# Patient Record
Sex: Male | Born: 1935
Health system: Southern US, Community
[De-identification: ages and names within clinical notes are randomized; demographics above are authoritative.]

## PROBLEM LIST (undated history)

## (undated) DIAGNOSIS — M179 Osteoarthritis of knee, unspecified: Secondary | ICD-10-CM

## (undated) DIAGNOSIS — I4510 Unspecified right bundle-branch block: Secondary | ICD-10-CM

## (undated) DIAGNOSIS — I10 Essential (primary) hypertension: Secondary | ICD-10-CM

## (undated) DIAGNOSIS — K219 Gastro-esophageal reflux disease without esophagitis: Secondary | ICD-10-CM

## (undated) DIAGNOSIS — K589 Irritable bowel syndrome without diarrhea: Secondary | ICD-10-CM

## (undated) DIAGNOSIS — J302 Other seasonal allergic rhinitis: Secondary | ICD-10-CM

## (undated) DIAGNOSIS — G834 Cauda equina syndrome: Secondary | ICD-10-CM

## (undated) DIAGNOSIS — M069 Rheumatoid arthritis, unspecified: Secondary | ICD-10-CM

## (undated) DIAGNOSIS — M1990 Unspecified osteoarthritis, unspecified site: Secondary | ICD-10-CM

## (undated) DIAGNOSIS — M1710 Unilateral primary osteoarthritis, unspecified knee: Secondary | ICD-10-CM

## (undated) DIAGNOSIS — E785 Hyperlipidemia, unspecified: Secondary | ICD-10-CM

## (undated) DIAGNOSIS — N189 Chronic kidney disease, unspecified: Secondary | ICD-10-CM

## (undated) DIAGNOSIS — D126 Benign neoplasm of colon, unspecified: Secondary | ICD-10-CM

## (undated) DIAGNOSIS — N400 Enlarged prostate without lower urinary tract symptoms: Secondary | ICD-10-CM

## (undated) HISTORY — DX: Irritable bowel syndrome, unspecified: K58.9

## (undated) HISTORY — DX: Rheumatoid arthritis, unspecified: M06.9

## (undated) HISTORY — DX: Unspecified right bundle-branch block: I45.10

## (undated) HISTORY — DX: Cauda equina syndrome: G83.4

## (undated) HISTORY — DX: Unspecified osteoarthritis, unspecified site: M19.90

## (undated) HISTORY — DX: Gastro-esophageal reflux disease without esophagitis: K21.9

## (undated) HISTORY — DX: Essential (primary) hypertension: I10

## (undated) HISTORY — DX: Other seasonal allergic rhinitis: J30.2

## (undated) HISTORY — DX: Benign prostatic hyperplasia without lower urinary tract symptoms: N40.0

## (undated) HISTORY — DX: Unilateral primary osteoarthritis, unspecified knee: M17.10

## (undated) HISTORY — DX: Chronic kidney disease, unspecified: N18.9

## (undated) HISTORY — PX: KNEE ARTHROPLASTY: SHX992

## (undated) HISTORY — DX: Hyperlipidemia, unspecified: E78.5

## (undated) HISTORY — DX: Osteoarthritis of knee, unspecified: M17.9

## (undated) HISTORY — DX: Benign neoplasm of colon, unspecified: D12.6

---

## 1972-01-16 HISTORY — PX: SPINE SURGERY: SHX786

## 2001-08-01 ENCOUNTER — Encounter: Payer: Self-pay | Admitting: Internal Medicine

## 2001-08-01 ENCOUNTER — Encounter: Admission: RE | Admit: 2001-08-01 | Discharge: 2001-08-01 | Payer: Self-pay | Admitting: Internal Medicine

## 2004-12-15 HISTORY — PX: JOINT REPLACEMENT: SHX530

## 2004-12-25 ENCOUNTER — Inpatient Hospital Stay (HOSPITAL_COMMUNITY): Admission: RE | Admit: 2004-12-25 | Discharge: 2004-12-28 | Payer: Self-pay | Admitting: Orthopedic Surgery

## 2005-01-15 HISTORY — PX: OTHER SURGICAL HISTORY: SHX169

## 2007-01-16 HISTORY — PX: OTHER SURGICAL HISTORY: SHX169

## 2008-09-27 ENCOUNTER — Encounter: Admission: RE | Admit: 2008-09-27 | Discharge: 2008-09-27 | Payer: Self-pay | Admitting: Neurological Surgery

## 2009-04-27 LAB — LIPID PANEL
Cholesterol: 145 mg/dL (ref 0–200)
LDL Cholesterol: 76 mg/dL
Triglycerides: 119 mg/dL (ref 40–160)

## 2009-07-15 DIAGNOSIS — D126 Benign neoplasm of colon, unspecified: Secondary | ICD-10-CM

## 2009-07-15 HISTORY — DX: Benign neoplasm of colon, unspecified: D12.6

## 2010-05-01 LAB — HEPATIC FUNCTION PANEL: AST: 23 U/L (ref 14–40)

## 2010-05-01 LAB — BASIC METABOLIC PANEL
BUN: 27 mg/dL — AB (ref 4–21)
Creatinine: 1.5 mg/dL — AB (ref 0.6–1.3)
Potassium: 4.9 mmol/L (ref 3.4–5.3)

## 2010-05-01 LAB — LIPID PANEL: HDL: 35 mg/dL (ref 35–70)

## 2010-05-30 LAB — BASIC METABOLIC PANEL: Sodium: 140 mmol/L (ref 137–147)

## 2010-06-02 NOTE — Op Note (Signed)
Jose Meza                ACCOUNT NO.:  [id number]   MEDICAL RECORD NO.:  [id number]          PATIENT TYPE:  INP   LOCATION:  0099                         FACILITY:  [facility]   PHYSICIAN:  Ollen Gross, M.D.    DATE OF BIRTH:  Nov 01, 1931   DATE OF PROCEDURE:  12/25/2004  DATE OF DISCHARGE:                                 OPERATIVE REPORT   PREOPERATIVE DIAGNOSIS:  Osteoarthritis right knee.   POSTOPERATIVE DIAGNOSIS:  Osteoarthritis right knee.   PROCEDURE:  Right total knee arthroplasty.   SURGEON:  Ollen Gross, MD.   ASSISTANT:  Avel Peace, PA-C.   ANESTHESIA:  Spinal.   ESTIMATED BLOOD LOSS:  Minimal.   DRAINS:  Hemovac x1.   TOURNIQUET TIME:  63 minutes at 300 mmHg.   COMPLICATIONS:  None.   CONDITION:  Stable to recovery.   BRIEF CLINICAL NOTE:  Jose Meza is a 77 year old male with severe end-stage  osteoarthritis of the right knee with progressively worsening pain and  dysfunction. He has developed a worsening flexion contracture now  approximately 30 to 35 degrees. He presents now for right total knee  arthroplasty.   PROCEDURE IN DETAIL:  After successful administration of spinal anesthetic,  a tourniquet was placed high on his right thigh and right lower extremity  prepped and draped in the usual sterile fashion. Extremity was wrapped in  Esmarch, knee flexed and tourniquet inflated to 300 mmHg. A standard midline  incision was made with a 10 blade through the subcutaneous tissue to the  level of the extensor mechanism. A fresh blade was used to make a medial  parapatellar arthrotomy and then the soft tissue over the proximal medial  tibia is subperiosteally elevated to the joint line with a knife and into  the semimembranosus bursa with a Cobb elevator. The soft tissue over the  proximal lateral tibia is also elevated with attention being paid to  avoiding the patellar tendon on the tibial tubercle. The patella was  everted, knee flexed 90 degrees,  and ACL and PCL removed. A drill was used  to create a starting hole in the distal femur and the canal was irrigated. A  5 degree right valgus alignment guide was placed and referencing off the  posterior condyles rotation is marked and the block pinned to remove 10 mm  off the distal femur. Distal femoral resection is made with an oscillating  saw. Due to his significant flexion contracture, we took an additional 2 mm  off to open up the extension space further. A size 5 is most appropriate  femoral size and the rotations marked off the epicondylar axis. The size 5  cutting block is placed and then the anterior, posterior and chamfer cuts  were made.   The tibia subluxed forward and the menisci are removed. Extramedullary  tibial alignment guide is placed referencing proximally at the medial aspect  of the tibial tubercle and distally along the second metatarsal axis and  tibial crest. The block is pinned to remove 10 mm off of the less deficient  lateral side. Tibial resection is made with an  oscillating saw. The proximal  tibia is then prepared with the modular drill and keel punch for a size 5.  Femoral preparation is completed with the intercondylar cut for the size 5.   A size 5 mobile bearing tibial trial with a size 5 posterior face stabilized  femoral trial and a 10 mm posterior stabilized rotating platform insert  trial are placed. Full extension was not achieved with a 10. We subsequently  removed with the trial and the tibial tray and removed all the osteophytes  off the posterior femur. There was a significant amount of osteophytes  present. I also stripped the posterior capsule off the femur. I then placed  and a tibial tray again with the 10 mm insert. He still lacked about 10-50  degrees from full extension and was tight in flexion also. We them removed  the tibial tray and took an additional 2 mm off the proximal tibia using the  cutting block and extramedullary guide. I  then reprepared the proximal tibia  for the 5. With the 10 insert, he now achieved full extension and had  excellent balance in flexion and extension. The patella was then everted and  thickness measured to be 27 mm. Freehand resection was taken to 14 mm, a 41  template was placed, lug holes were drilled, trial patella was placed and it  tracks normally. The trials were then removed and the cut bone surfaces were  prepared with pulsatile lavage. Cement is mixed and once ready for  implantation, a size 5 mobile bearing tibial tray, size 5 posterior  stabilized femur and 41 patella are cemented into place and the patella is  held with a clamp. The trial 10 mm insert is placed, knee held in full  extension and all extruded cement removed. Once the cement was fully  hardened then the permanent 10 mm posterior stabilized rotating platform  insert is placed into the tibial tray. The wound was copiously irrigated  with saline solution and the extensor mechanism closed over a Hemovac drain  with interrupted #1 PDS. Flexion against gravity was 130 degrees. The subcu  tissues were then closed with interrupted 2-0 Vicryl. The tourniquet was  released with a total time of 63 minutes. The subcuticular was closed with  running 4-0 Monocryl. A drain is hooked to suction, incision cleaned and  dried and Steri-Strips and a bulky sterile dressing applied. He was then  placed into a knee immobilizer, awakened and transported to recovery in  stable condition.      Ollen Gross, M.D.  Electronically Signed     FA/MEDQ  D:  12/25/2004  T:  12/26/2004  Job:  [id number]

## 2010-06-02 NOTE — H&P (Signed)
NAMEDANILO, CAPPIELLO NO.:  [id number]   MEDICAL RECORD NO.:  [id number]          PATIENT TYPE:   LOCATION:                                 FACILITY:   PHYSICIAN:  Ollen Gross, M.D.         DATE OF BIRTH:   DATE OF ADMISSION:  12/25/2004  DATE OF DISCHARGE:                                HISTORY & PHYSICAL   Date of office visit and history and physical:  December 14, 2004.   CHIEF COMPLAINT:  Right knee pain.   HISTORY OF PRESENT ILLNESS:  The patient is a 77 year old male seen by Dr.  Lequita Halt for ongoing right knee pain.  He has been told in the past he has  knee replacement. His wife has been a patient of Dr. Deri Fuelling for sometime  now.  He has been seen and evaluated and found to have bone-on-bone changes.  It is felt he has reached the point where due to his pain and dysfunction,  he would benefit from knee replacement.  Risks and benefits have been  discussed.  The patient subsequently admitted to the hospital.   ALLERGIES:  CODEINE causes mild hallucinations and dizziness.   CURRENT MEDICATIONS:  1.  Atenolol 50 mg.  2.  Flomax.  3.  Proscar.  4.  Lipitor.  5.  Nexium.   PAST MEDICAL HISTORY:  1.  Hypertension.  2.  Reflux disease.  3.  History of prostatitis.  4.  Degenerative disk disease.   PAST SURGICAL HISTORY:  Decompressive laminectomy in 1974.   FAMILY HISTORY:  History of cancer.   SOCIAL HISTORY:  Married.  Retired.  Non-smoker.  Three to five ounces of  alcohol per week.  Three children.   REVIEW OF SYSTEMS:  GENERAL:  No fever, chills or night sweats.  NEUROLOGIC:  No seizures, syncope or paralysis.  RESPIRATORY:  No shortness of breath,  productive cough, or hemoptysis.  CARDIOVASCULAR:  No chest pain, angina or  orthopnea.  GI:  No nausea, vomiting, diarrhea, or constipation.  GU:  No  dysuria, hematuria or discharge.  MUSCULOSKELETAL:  Right knee.   PHYSICAL EXAMINATION:  VITAL SIGNS: Pulse 88, respirations 12, blood  pressure 120/76.  GENERAL:  A 77 year old white male, tall frame, in no acute distress. Well-  developed. Accompanied by his wife.  HEENT:  Normocephalic, atraumatic.  Pupils are round and reactive.  Oropharynx clear. EOMs are intact. Noted to wear glasses.  NECK:  Supple.  CHEST:  Clear.  HEART:  Regular rate and rhythm.  No murmurs.  ABDOMEN:  Soft, nontender.  Bowel sounds present.  RECTAL/BREASTS/GENITALIA:  Not done and not pertinent to present illness.  EXTREMITIES:  Right knee:  Right knee shows a slight valgus malalignment  deformity.  Range of motion 5 to 110 degrees.  Marked crepitus noted.   IMPRESSION:  Osteoarthritis, right knee.   PLAN:  The patient admitted to [facility] to undergo right total  knee arthroplasty.  Surgery will be performed by Dr. Ollen Gross.  Medical  doctor is Dr. Kirby Funk who will be notified  of the admission and be  consulted if needed for medical assistance for the patient throughout the  hospital course.      Alexzandrew L. Julien Girt, P.A.      Ollen Gross, M.D.  Electronically Signed    ALP/MEDQ  D:  01/01/2005  T:  01/02/2005  Job:  [id number]   cc:   Thora Lance, M.D.  Fax: [phone]

## 2010-06-02 NOTE — Discharge Summary (Signed)
[name], [name]                ACCOUNT NO.:  [id number]   MEDICAL RECORD NO.:  [id number]          PATIENT TYPE:  INP   LOCATION:  1613                         FACILITY:  [facility]   PHYSICIAN:  Jose Meza, M.D.    DATE OF BIRTH:  [birth year]   DATE OF ADMISSION:  12/25/2004  DATE OF DISCHARGE:  12/28/2004                                 DISCHARGE SUMMARY   ADMISSION DIAGNOSES:  1.  Osteoarthritis, right knee.  2.  Hypertension.  3.  Reflux disease.  4.  History of prostatitis.  5.  Degenerative disk disease.   DISCHARGE DIAGNOSES:  1.  Osteoarthritis, right knee, status post right total knee arthroplasty.  2.  Hypertension.  3.  Reflux disease.  4.  History of prostatitis.  5.  Degenerative disk disease.  6.  Mild postoperative blood loss anemia.   PROCEDURE:  On December 25, 2004, right total knee.   SURGEON:  Jose Meza, M.D.   ASSISTANT:  Jose Meza, P.A.-C.   ANESTHESIA:  Spinal.   TOURNIQUET TIME:  Sixty-three minutes.   BRIEF HISTORY:  Jose Meza is a 77 year old with severe end-stage osteoarthritis  of the right knee, progressively worsening pain and dysfunction.  Now  presents for total knee.   CONSULTS:  None.   LABORATORY DATA:  Preop CBC:  Hemoglobin 16, hematocrit 47.  Postop  hemoglobin 13.3.  Last noted H&H 12.3 and 36.  PT/PTT on admission 13.5 and  31, respectively with an INR of 1.  Serial pro times followed:  PT/INR 21.4  and 1.8.  Chem panel on admission all within normal limits with the  exception of minimally elevated ALT of 55.  Serial BMETs are followed.  Electrolytes remained within normal limits.  Urinalysis:  Preop, small  leukocyte esterase, 0-2 white cells, rare bacteria, otherwise negative.  Blood group type A+.   EKG dated December 21, 2004:  Normal sinus rhythm, incomplete right bundle  branch block.   Two view chest on December 21, 2004:  Increased markings in the lung bases,  likely chronic.  No acute process  suspected.   HOSPITAL COURSE:  Admitted to [facility], tolerated the procedure  well, and was later transferred to the recovery room and then to the  orthopedic floor.  Was doing pretty well on day #1 except for pain.  Been  using PCA and p.o. analgesics.  Did have some positive fluid balance and  underwent some mild diuresis.  Later that day, the patient got a little bit  lightheaded and disoriented.  Was felt to be more associated with the  narcotics.  PCA was held.  He did improve.  By the following day, he was  still having some pain but felt better after he had been on the PCA.  It was  felt the episode was more likely due to the PCA narcotic.  He started  getting with physical therapy, ambulating approximately 5-10 feet on the  first day, but then did very well by day #3.  He was actually getting up to  100 feet.  Dressing was changed on day #  2.  Incision was healing well.  He  did so well on day #3 with this therapy, he had decided he wanted to go  home.  Arrangements were made.  Discharged home on December 28, 2004.   DISCHARGE PLAN:  1.  Patient was discharged home on December 14 , 2006.  2.  Discharge diagnoses:  Please see above.  3.  Discharge meds:  Coumadin, Percocet, Robaxin.  4.  Diet:  As tolerated.  5.  Follow up two weeks.  6.  Activity:  Weightbearing as tolerated.  Total knee protocol.  May start      showering.   DISPOSITION:  Home.   CONDITION ON DISCHARGE:  Improved.      Jose Meza, P.A.      Jose Meza, M.D.  Electronically Signed    ALP/MEDQ  D:  02/21/2005  T:  02/21/2005  Job:  [id number]   cc:   Dr. Valentina Meza

## 2010-07-26 ENCOUNTER — Telehealth: Payer: Self-pay | Admitting: Internal Medicine

## 2010-07-26 NOTE — Telephone Encounter (Signed)
Forwarded to Dr. Leschber for review.  °

## 2010-09-04 ENCOUNTER — Other Ambulatory Visit (HOSPITAL_BASED_OUTPATIENT_CLINIC_OR_DEPARTMENT_OTHER): Payer: Self-pay | Admitting: Rheumatology

## 2010-09-04 DIAGNOSIS — M659 Synovitis and tenosynovitis, unspecified: Secondary | ICD-10-CM

## 2010-09-09 ENCOUNTER — Ambulatory Visit (HOSPITAL_BASED_OUTPATIENT_CLINIC_OR_DEPARTMENT_OTHER)
Admission: RE | Admit: 2010-09-09 | Discharge: 2010-09-09 | Disposition: A | Discharge status: Home / Self Care | Payer: Medicare Other | Source: Ambulatory Visit | Attending: Rheumatology | Admitting: Rheumatology

## 2010-09-09 DIAGNOSIS — M25549 Pain in joints of unspecified hand: Secondary | ICD-10-CM | POA: Insufficient documentation

## 2010-09-09 DIAGNOSIS — M19039 Primary osteoarthritis, unspecified wrist: Secondary | ICD-10-CM | POA: Insufficient documentation

## 2010-09-09 DIAGNOSIS — M79609 Pain in unspecified limb: Secondary | ICD-10-CM

## 2010-09-09 DIAGNOSIS — M659 Synovitis and tenosynovitis, unspecified: Secondary | ICD-10-CM

## 2010-09-09 DIAGNOSIS — R609 Edema, unspecified: Secondary | ICD-10-CM

## 2010-09-09 DIAGNOSIS — R209 Unspecified disturbances of skin sensation: Secondary | ICD-10-CM

## 2010-09-09 DIAGNOSIS — M6281 Muscle weakness (generalized): Secondary | ICD-10-CM | POA: Insufficient documentation

## 2010-10-20 ENCOUNTER — Encounter: Payer: Self-pay | Admitting: Internal Medicine

## 2010-10-20 ENCOUNTER — Ambulatory Visit (INDEPENDENT_AMBULATORY_CARE_PROVIDER_SITE_OTHER): Payer: Medicare Other | Admitting: Internal Medicine

## 2010-10-20 ENCOUNTER — Ambulatory Visit: Payer: Self-pay | Admitting: Internal Medicine

## 2010-10-20 DIAGNOSIS — J302 Other seasonal allergic rhinitis: Secondary | ICD-10-CM

## 2010-10-20 DIAGNOSIS — M129 Arthropathy, unspecified: Secondary | ICD-10-CM

## 2010-10-20 DIAGNOSIS — M1990 Unspecified osteoarthritis, unspecified site: Secondary | ICD-10-CM | POA: Insufficient documentation

## 2010-10-20 DIAGNOSIS — D126 Benign neoplasm of colon, unspecified: Secondary | ICD-10-CM

## 2010-10-20 DIAGNOSIS — E785 Hyperlipidemia, unspecified: Secondary | ICD-10-CM | POA: Insufficient documentation

## 2010-10-20 DIAGNOSIS — N400 Enlarged prostate without lower urinary tract symptoms: Secondary | ICD-10-CM | POA: Insufficient documentation

## 2010-10-20 DIAGNOSIS — K219 Gastro-esophageal reflux disease without esophagitis: Secondary | ICD-10-CM

## 2010-10-20 DIAGNOSIS — M1710 Unilateral primary osteoarthritis, unspecified knee: Secondary | ICD-10-CM

## 2010-10-20 DIAGNOSIS — N182 Chronic kidney disease, stage 2 (mild): Secondary | ICD-10-CM

## 2010-10-20 DIAGNOSIS — Z0000 Encounter for general adult medical examination without abnormal findings: Secondary | ICD-10-CM

## 2010-10-20 DIAGNOSIS — I10 Essential (primary) hypertension: Secondary | ICD-10-CM | POA: Insufficient documentation

## 2010-10-20 MED ORDER — TRAMADOL HCL 50 MG PO TABS: 50.0000 mg | ORAL_TABLET | Freq: Four times a day (QID) | ORAL | Status: DC | PRN

## 2010-10-20 NOTE — Progress Notes (Signed)
Subjective:    Patient ID: Jose Meza, male    DOB: [birth year], 77 y.o.   MRN: [id number]  HPI New pt to me and our practice today, here to establish care  Also here for medicare wellness  Diet: heart healthy Physical activity: active, no ADL restriction, exercise 4-4W/NUUV Depression/mood screen: negative Hearing: intact to whispered voice Visual acuity: grossly normal, performs annual eye exam  ADLs: capable Fall risk: none Home safety: good Cognitive evaluation: intact to orientation, naming, recall and repetition EOL planning: adv directives, full code/ I agree  I have personally reviewed and have noted 1. The patient's medical and social history 2. Their use of alcohol, tobacco or illicit drugs 3. Their current medications and supplements 4. The patient's functional ability including ADL's, fall risks, home safety risks and hearing or visual impairment. 5. Diet and physical activities 6. Evidence for depression or mood disorders  Also reviewed chronic medical issues" Arthritis - onset spring 2012 - swelling in B wrists, fingers > ankles - eval by PCP>> "arthiritis" and told to stop NSAIDs due to CKD, labs neg - self refer to rheum for additional eval>> tests and MRI did not show any rheumatologist problmes - symptoms improved and stiffness improved but not resolved - using tylenol, alter meds (cactus juice) and acupuncture - ?other tx options  HTN - the patient reports compliance with medication(s) as prescribed. Denies adverse side effects.  Dyslipidemia - on lipitor for years the patient reports compliance with medication(s) as prescribed. Denies adverse side effects.   Past Medical History  Diagnosis Date  . GERD (gastroesophageal reflux disease)   . Arthritis     neg rheum eval  . Hypertension   . BPH (benign prostatic hypertrophy)     recurrent prostatitis, hx BOO  . Chronic kidney disease     stage 2-3  . Hyperlipidemia   . Allergic rhinitis, seasonal     . IBS (irritable bowel syndrome)     constipation prone  . DJD (degenerative joint disease) of knee     right knee, s/p knee replacement 12/06  . Cauda equina, syndrome   . Polyp of colon, adenomatous 07/2009    buccini   Family History  Problem Relation Age of Onset  . Colon cancer Mother    History  Substance Use Topics  . Smoking status: Former Smoker -- 2.0 packs/day for 7 years    Quit date: 01/15/1961  . Smokeless tobacco: Never Used  . Alcohol Use: Yes     Les than 1 per day    Review of Systems Constitutional: Negative for fever.  Respiratory: Negative for cough and shortness of breath.   Cardiovascular: Negative for chest pain.  Gastrointestinal: Negative for abdominal pain.  Musculoskeletal: Negative for gait problem.  Skin: Negative for rash.  Neurological: Negative for dizziness.  No other specific complaints in a complete review of systems (except as listed in HPI above).     Objective:   Physical Exam BP 128/80  Pulse 89  Temp(Src) 98.4 F (36.9 C) (Oral)  Ht 6' (1.829 m)  Wt 205 lb (92.987 kg)  BMI 27.80 kg/m2  SpO2 99% Wt Readings from Last 3 Encounters:  10/20/10 205 lb (92.987 kg)    Constitutional:  Tall, fit and spry man, appropriate age appearing. oriented to person, place, and time. appears well-developed and well-nourished. No distress.  HENT: NCAT, TMs clear without redness or effusion, no cerumen - OP clear OBJECTIVE: erythema or exudate Eyes: wears corrective lenses, PERRL ,  EOMI - no conjunctivitis or icterus Neck: Normal range of motion. Neck supple. No JVD present. No thyromegaly present.  Cardiovascular: Normal rate, regular rhythm and normal heart sounds.  No murmur heard. no BLE edema Pulmonary/Chest: Effort normal and breath sounds normal. No respiratory distress. no wheezes.  Abdominal: Soft. Bowel sounds are normal. Patient exhibits no distension. There is no tenderness. no mass Musculoskeletal: Normal range of motion. Patient  exhibits no edema. no synovitis or swelling BUE/hands Neurological: he is alert and oriented to person, place, and time. No cranial nerve deficit. Coordination normal.  Skin: Skin is warm and dry.  No erythema or ulceration.  Psychiatric: he has a normal mood and affect. behavior is normal. Judgment and thought content normal.   Lab Results  Component Value Date   CHOL 136 05/01/2010   TRIG 156 05/01/2010   HDL 35 05/01/2010   LDLCALC 69 05/01/2010   ALT 32 05/01/2010   AST 23 05/01/2010   NA 140 05/30/2010   K 4.5 05/30/2010   CREATININE 1.5* 05/01/2010   BUN 26* 05/30/2010       Assessment & Plan:  AVW - v70.0 - Today patient counseled on age appropriate routine health concerns for screening and prevention, each reviewed and up to date or declined. Immunizations reviewed and up to date or declined. Labs and prior records reviewed. Risk factors for depression reviewed and negative. Hearing function and visual acuity are intact. ADLs screened and addressed as needed. Functional ability and level of safety reviewed and appropriate. Education, counseling and referrals performed based on assessed risks today. Patient provided with a copy of personalized plan for preventive services.  Also See problem list. Medications and labs reviewed today.

## 2010-10-20 NOTE — Patient Instructions (Addendum)
It was good to see you today. We have reviewed your prior records including labs and tests today Medications reviewed, no changes at this time but try tramadol as need for arthirits pain symptoms (and/or tylenol) Your prescription(s) have been submitted to your pharmacy. Please take as directed and contact our office if you believe you are having problem(s) with the medication(s). Please schedule followup in 6 months to monitor blood pressure and cholesterol, call sooner if problems.

## 2010-10-21 ENCOUNTER — Encounter: Payer: Self-pay | Admitting: Internal Medicine

## 2010-10-21 NOTE — Assessment & Plan Note (Signed)
Onset 05/2010 - complete rheum eval with labs and MRI unremarkable for autoimmune or other etiology - reviewed same in depth today Swelling and stiffness much improved  Follows with deveshwar prn Will try tramadol prn and continue to avoid NSAIDs - erx done

## 2010-10-21 NOTE — Assessment & Plan Note (Signed)
On statin - last lipids reviewed - checks each spring The current medical regimen is effective;  continue present plan and medications.

## 2010-10-21 NOTE — Assessment & Plan Note (Signed)
Reviewed dx and implications/reassurance Stable over last 3+ years of record review Avoiding NSAIDs - will try tramadol prn +/- tylenol - erx done Lab Results  Component Value Date   CREATININE 1.5* 05/01/2010

## 2010-10-21 NOTE — Assessment & Plan Note (Signed)
BP Readings from Last 3 Encounters:  10/20/10 128/80   The current medical regimen is effective;  continue present plan and medications.

## 2010-10-23 ENCOUNTER — Other Ambulatory Visit: Payer: Self-pay

## 2010-10-23 NOTE — Telephone Encounter (Signed)
Pt called requesting 90 day supply to mail order

## 2010-10-23 NOTE — Telephone Encounter (Signed)
Ok refill done 

## 2010-10-24 MED ORDER — ZOLPIDEM TARTRATE 10 MG PO TABS: 10.0000 mg | ORAL_TABLET | Freq: Every evening | ORAL | Status: DC | PRN

## 2010-10-24 NOTE — Telephone Encounter (Signed)
Called pt spoke with wife faxing script back to [facility]...10/24/10@9 :25am/LMB

## 2010-11-30 ENCOUNTER — Ambulatory Visit (INDEPENDENT_AMBULATORY_CARE_PROVIDER_SITE_OTHER): Payer: Medicare Other | Admitting: Internal Medicine

## 2010-11-30 ENCOUNTER — Encounter: Payer: Self-pay | Admitting: Internal Medicine

## 2010-11-30 ENCOUNTER — Other Ambulatory Visit (INDEPENDENT_AMBULATORY_CARE_PROVIDER_SITE_OTHER): Payer: Medicare Other

## 2010-11-30 VITALS — BP 128/62 | HR 90 | Temp 98.3°F

## 2010-11-30 DIAGNOSIS — R5383 Other fatigue: Secondary | ICD-10-CM

## 2010-11-30 DIAGNOSIS — R5381 Other malaise: Secondary | ICD-10-CM

## 2010-11-30 DIAGNOSIS — D649 Anemia, unspecified: Secondary | ICD-10-CM

## 2010-11-30 DIAGNOSIS — L309 Dermatitis, unspecified: Secondary | ICD-10-CM

## 2010-11-30 DIAGNOSIS — G4700 Insomnia, unspecified: Secondary | ICD-10-CM

## 2010-11-30 DIAGNOSIS — L259 Unspecified contact dermatitis, unspecified cause: Secondary | ICD-10-CM

## 2010-11-30 MED ORDER — BETAMETHASONE VALERATE 0.1 % EX OINT: TOPICAL_OINTMENT | Freq: Two times a day (BID) | CUTANEOUS | Status: DC

## 2010-11-30 MED ORDER — AMITRIPTYLINE HCL 10 MG PO TABS: 10.0000 mg | ORAL_TABLET | Freq: Every day | ORAL | Status: DC

## 2010-11-30 NOTE — Progress Notes (Signed)
  Subjective:    Patient ID: Jose Meza, male    DOB: [birth year], 77 y.o.   MRN: [id number]  HPI  complains of insomnia - chronic ambein use -  Also "temperature problems"  - no fever or night sweats or weight changes but feels chilled at night Crackled finger tips x 2 months - painful. Unresponsive to over-the-counter moisturizing ointment   Also reviewed chronic medical issues: Arthritis - onset spring 2012 - swelling in B wrists, fingers > ankles - eval by PCP>> "arthiritis" and told to stop NSAIDs due to CKD, labs neg - self refer to rheum for additional eval>> tests and MRI did not show any rheumatologist problmes - symptoms improved and stiffness improved but not resolved - using tylenol, alter meds (cactus juice) and acupuncture - ?other tx options  HTN - the patient reports compliance with medication(s) as prescribed. Denies adverse side effects.  Dyslipidemia - on lipitor for years the patient reports compliance with medication(s) as prescribed. Denies adverse side effects.   Past Medical History  Diagnosis Date  . GERD (gastroesophageal reflux disease)   . Arthritis     neg rheum eval  . Hypertension   . BPH (benign prostatic hypertrophy)     recurrent prostatitis, hx BOO  . Chronic kidney disease     stage 2-3  . Hyperlipidemia   . Allergic rhinitis, seasonal   . IBS (irritable bowel syndrome)     constipation prone  . DJD (degenerative joint disease) of knee     right knee, s/p knee replacement 12/06  . Cauda equina, syndrome   . Polyp of colon, adenomatous 07/2009    buccini    Review of Systems  Respiratory: Negative for cough and shortness of breath.   Cardiovascular: Negative for chest pain or palpitations.  Gastrointestinal: Negative for abdominal pain.      Objective:   Physical Exam  BP 128/62  Pulse 90  Temp(Src) 98.3 F (36.8 C) (Oral)  SpO2 99% Wt Readings from Last 3 Encounters:  10/20/10 205 lb (92.987 kg)   Constitutional:  Tall,  fit and spry man, appropriate age appearing. oriented to person, place, and time. appears well-developed and well-nourished. No distress.  Neck: Normal range of motion. Neck supple. No JVD present. No thyromegaly present.  Cardiovascular: Normal rate, regular rhythm and normal heart sounds.  No murmur heard. no BLE edema Pulmonary/Chest: Effort normal and breath sounds normal. No respiratory distress. no wheezes.  Psychiatric: he has a normal mood and affect. behavior is normal. Judgment and thought content normal.   Lab Results  Component Value Date   CHOL 136 05/01/2010   TRIG 156 05/01/2010   HDL 35 05/01/2010   LDLCALC 69 05/01/2010   ALT 32 05/01/2010   AST 23 05/01/2010   NA 140 05/30/2010   K 4.5 05/30/2010   CREATININE 1.5* 05/01/2010   BUN 26* 05/30/2010       Assessment & Plan:  Fatigue/chilled - no evidence of infectious or metabolic issue - recent labs August 2012 rheumatologist reviewed, unremarkable. Check TSH and ferritin now, monitor for change in symptoms  Eczema, fingertips- exacerbated by weather changes - high-dose topical steroid ointment prescribed with advice to use using occlusive dressing each bedtime as needed  Insomnia - chronic. Unresponsive to Ambien, prior treatment with Lunesta and Restoril also ineffective. We'll try adding low-dose Elavil - also check ferritin to consider restless leg symptoms treatment if low

## 2010-11-30 NOTE — Patient Instructions (Signed)
It was good to see you today. Try low-dose Elavil for sleep at night Prescription strength steroid ointment for fingertip problems and use occlusive dressing at night to help absorption Your prescription(s) have been submitted to your pharmacy. Please take as directed and contact our office if you believe you are having problem(s) with the medication(s). Test(s) ordered today. Your results will be called to you after review (48-72hours after test completion). If any changes need to be made, you will be notified at that time.

## 2010-12-01 LAB — TSH: TSH: 3.7 u[IU]/mL (ref 0.35–5.50)

## 2010-12-04 LAB — FERRITIN: Ferritin: 342.8 ng/mL — ABNORMAL HIGH (ref 22.0–322.0)

## 2010-12-18 ENCOUNTER — Other Ambulatory Visit: Payer: Self-pay

## 2010-12-18 ENCOUNTER — Other Ambulatory Visit: Payer: Self-pay | Admitting: Internal Medicine

## 2010-12-18 MED ORDER — TAMSULOSIN HCL 0.4 MG PO CAPS: 0.4000 mg | ORAL_CAPSULE | Freq: Every day | ORAL | Status: DC

## 2011-01-24 DIAGNOSIS — L259 Unspecified contact dermatitis, unspecified cause: Secondary | ICD-10-CM | POA: Diagnosis not present

## 2011-01-24 DIAGNOSIS — D239 Other benign neoplasm of skin, unspecified: Secondary | ICD-10-CM | POA: Diagnosis not present

## 2011-02-19 ENCOUNTER — Other Ambulatory Visit: Payer: Self-pay

## 2011-02-19 MED ORDER — MEPROBAMATE 200 MG PO TABS: 200.0000 mg | ORAL_TABLET | Freq: Three times a day (TID) | ORAL | Status: DC | PRN

## 2011-02-19 NOTE — Telephone Encounter (Signed)
Rx faxed to [facility] on file

## 2011-03-16 DIAGNOSIS — R5381 Other malaise: Secondary | ICD-10-CM | POA: Diagnosis not present

## 2011-03-16 DIAGNOSIS — M25549 Pain in joints of unspecified hand: Secondary | ICD-10-CM | POA: Diagnosis not present

## 2011-03-16 DIAGNOSIS — M545 Low back pain: Secondary | ICD-10-CM | POA: Diagnosis not present

## 2011-03-16 DIAGNOSIS — M25579 Pain in unspecified ankle and joints of unspecified foot: Secondary | ICD-10-CM | POA: Diagnosis not present

## 2011-03-16 DIAGNOSIS — R5383 Other fatigue: Secondary | ICD-10-CM | POA: Diagnosis not present

## 2011-03-26 ENCOUNTER — Other Ambulatory Visit: Payer: Self-pay | Admitting: *Deleted

## 2011-03-26 DIAGNOSIS — N400 Enlarged prostate without lower urinary tract symptoms: Secondary | ICD-10-CM | POA: Diagnosis not present

## 2011-03-26 DIAGNOSIS — R339 Retention of urine, unspecified: Secondary | ICD-10-CM | POA: Diagnosis not present

## 2011-03-26 DIAGNOSIS — N419 Inflammatory disease of prostate, unspecified: Secondary | ICD-10-CM | POA: Diagnosis not present

## 2011-03-26 MED ORDER — ZOLPIDEM TARTRATE 10 MG PO TABS: 10.0000 mg | ORAL_TABLET | Freq: Every evening | ORAL | Status: DC | PRN

## 2011-03-26 NOTE — Telephone Encounter (Signed)
i printed 

## 2011-03-26 NOTE — Telephone Encounter (Signed)
VAL pt, please advise 

## 2011-03-26 NOTE — Telephone Encounter (Signed)
Patient is asking that Zolpidem be faxed to [facility] for a 90 day supply.

## 2011-03-27 NOTE — Telephone Encounter (Signed)
Rx faxed to [facility], pt informed.

## 2011-03-29 DIAGNOSIS — N400 Enlarged prostate without lower urinary tract symptoms: Secondary | ICD-10-CM | POA: Diagnosis not present

## 2011-03-29 DIAGNOSIS — R339 Retention of urine, unspecified: Secondary | ICD-10-CM | POA: Diagnosis not present

## 2011-04-02 ENCOUNTER — Other Ambulatory Visit: Payer: Self-pay | Admitting: *Deleted

## 2011-04-02 MED ORDER — ZOLPIDEM TARTRATE 10 MG PO TABS: 10.0000 mg | ORAL_TABLET | Freq: Every evening | ORAL | Status: DC | PRN

## 2011-04-02 NOTE — Telephone Encounter (Signed)
[facility]... 04/02/11@10 :03am/LMB

## 2011-04-16 DIAGNOSIS — M545 Low back pain: Secondary | ICD-10-CM | POA: Diagnosis not present

## 2011-04-23 ENCOUNTER — Ambulatory Visit: Payer: Medicare Other | Admitting: Internal Medicine

## 2011-04-23 DIAGNOSIS — N320 Bladder-neck obstruction: Secondary | ICD-10-CM | POA: Diagnosis not present

## 2011-04-23 DIAGNOSIS — N400 Enlarged prostate without lower urinary tract symptoms: Secondary | ICD-10-CM | POA: Diagnosis not present

## 2011-04-23 DIAGNOSIS — Z0289 Encounter for other administrative examinations: Secondary | ICD-10-CM

## 2011-04-23 DIAGNOSIS — N419 Inflammatory disease of prostate, unspecified: Secondary | ICD-10-CM | POA: Diagnosis not present

## 2011-04-25 DIAGNOSIS — M545 Low back pain: Secondary | ICD-10-CM | POA: Diagnosis not present

## 2011-05-02 DIAGNOSIS — M545 Low back pain: Secondary | ICD-10-CM | POA: Diagnosis not present

## 2011-05-04 DIAGNOSIS — M545 Low back pain: Secondary | ICD-10-CM | POA: Diagnosis not present

## 2011-05-07 DIAGNOSIS — M545 Low back pain: Secondary | ICD-10-CM | POA: Diagnosis not present

## 2011-05-09 ENCOUNTER — Other Ambulatory Visit: Payer: Self-pay

## 2011-05-09 DIAGNOSIS — M545 Low back pain: Secondary | ICD-10-CM | POA: Diagnosis not present

## 2011-05-09 MED ORDER — MEPROBAMATE 200 MG PO TABS: 200.0000 mg | ORAL_TABLET | Freq: Three times a day (TID) | ORAL | Status: DC | PRN

## 2011-05-10 NOTE — Telephone Encounter (Signed)
Rx faxed to pharmacy  

## 2011-05-14 DIAGNOSIS — M545 Low back pain: Secondary | ICD-10-CM | POA: Diagnosis not present

## 2011-05-15 DIAGNOSIS — R5381 Other malaise: Secondary | ICD-10-CM | POA: Diagnosis not present

## 2011-05-15 DIAGNOSIS — I7300 Raynaud's syndrome without gangrene: Secondary | ICD-10-CM | POA: Diagnosis not present

## 2011-05-15 DIAGNOSIS — M25549 Pain in joints of unspecified hand: Secondary | ICD-10-CM | POA: Diagnosis not present

## 2011-05-15 DIAGNOSIS — M545 Low back pain: Secondary | ICD-10-CM | POA: Diagnosis not present

## 2011-05-16 DIAGNOSIS — M542 Cervicalgia: Secondary | ICD-10-CM | POA: Diagnosis not present

## 2011-05-18 ENCOUNTER — Telehealth: Payer: Self-pay

## 2011-05-18 ENCOUNTER — Encounter: Payer: Self-pay | Admitting: Endocrinology

## 2011-05-18 ENCOUNTER — Ambulatory Visit (INDEPENDENT_AMBULATORY_CARE_PROVIDER_SITE_OTHER): Payer: Medicare Other | Admitting: Endocrinology

## 2011-05-18 VITALS — BP 132/82 | HR 77 | Temp 97.7°F | Ht 72.0 in | Wt 202.0 lb

## 2011-05-18 DIAGNOSIS — R040 Epistaxis: Secondary | ICD-10-CM | POA: Diagnosis not present

## 2011-05-18 MED ORDER — MEPROBAMATE 200 MG PO TABS: 200.0000 mg | ORAL_TABLET | Freq: Three times a day (TID) | ORAL | Status: DC | PRN

## 2011-05-18 NOTE — Telephone Encounter (Signed)
Pt called stating he was advised by [facility] that medication refill was not received (manually and electronically sent 2x each). Pt verified fax number he was given by pharmacy with number in EPIC - same. I called [facility] and spoke with Darreld Mclean who also verified number. Darreld Mclean suggested medication be faxed to [facility] instead. Rx resent, pt advised of same.

## 2011-05-18 NOTE — Progress Notes (Signed)
Subjective:    Patient ID: Jose Meza, male    DOB: May 05, 1931, 78 y.o.   MRN: [id number]  HPI Few mos of intermittent mild epistaxis from the left nare, and assoc dry skin.  He takes asa 1/day.  No h/o bleeding probs. Past Medical History  Diagnosis Date  . GERD (gastroesophageal reflux disease)   . Arthritis     neg rheum eval  . Hypertension   . BPH (benign prostatic hypertrophy)     recurrent prostatitis, hx BOO  . Chronic kidney disease     stage 2-3  . Hyperlipidemia   . Allergic rhinitis, seasonal   . IBS (irritable bowel syndrome)     constipation prone  . DJD (degenerative joint disease) of knee     right knee, s/p knee replacement 12/06  . Cauda equina, syndrome   . Polyp of colon, adenomatous 07/2009    buccini    Past Surgical History  Procedure Date  . Joint replacement 12/2004    R TKR  . Spine surgery 1974  . Right knee replacement 2007  . Ulner tranpos 2009    History   Social History  . Marital Status: Married    Spouse Name: N/A    Number of Children: N/A  . Years of Education: N/A   Occupational History  . Not on file.   Social History Main Topics  . Smoking status: Former Smoker -- 2.0 packs/day for 7 years    Quit date: 01/15/1961  . Smokeless tobacco: Never Used  . Alcohol Use: Yes     Les than 1 per day  . Drug Use: No  . Sexually Active: Not on file   Other Topics Concern  . 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 atorvastatin (LIPITOR) 10 MG tablet Take 10 mg by mouth daily.        . betamethasone valerate ointment (VALISONE) 0.1 % APPLY TOPCIALLY TWICE DAILY AS DIRECTED  30 g  0  . esomeprazole (NEXIUM) 40 MG capsule Take 40 mg by mouth daily before breakfast.        . finasteride (PROSCAR) 5 MG tablet Take 5 mg by mouth daily.        .  fluticasone (FLONASE) 50 MCG/ACT nasal spray Place 2 sprays into the nose as needed.        . meprobamate (EQUANIL) 200 MG tablet Take 1 tablet (200 mg total) by mouth 3 (three) times daily as needed for pain.  270 tablet  1  . metoprolol (TOPROL-XL) 100 MG 24 hr tablet Take 100 mg by mouth daily.        . Tamsulosin HCl (FLOMAX) 0.4 MG CAPS Take 1 capsule (0.4 mg total) by mouth daily.  90 capsule  1  . zolpidem (AMBIEN) 10 MG tablet Take 1 tablet (10 mg total) by mouth at bedtime as needed.  90 tablet  1  . amitriptyline (ELAVIL) 10 MG tablet Take 1 tablet (10 mg total) by mouth at bedtime.  30 tablet  3  . COLCRYS 0.6 MG tablet Take 1 by mouth as needed      . oxycodone (OXY-IR) 5 MG capsule Take 5 mg by mouth every 6 (six) hours as needed.        . traMADol (ULTRAM) 50 MG tablet  Take 1 tablet (50 mg total) by mouth every 6 (six) hours as needed for pain.  40 tablet  1  . VOLTAREN 1 % GEL         Allergies  Allergen Reactions  . Sulfa Antibiotics     Family History  Problem Relation Age of Onset  . Colon cancer Mother     BP 132/82  Pulse 77  Temp(Src) 97.7 F (36.5 C) (Oral)  Ht 6' (1.829 m)  Wt 202 lb (91.627 kg)  BMI 27.40 kg/m2  SpO2 96%  Review of Systems Denies easy bruising and brbpr    Objective:   Physical Exam VITAL SIGNS:  See vs page GENERAL: no distress Left nare: normal mucosa     Assessment & Plan:  Epistaxis, new.  mild

## 2011-05-18 NOTE — Patient Instructions (Signed)
Apply vaseline locally No asa x 5 days I hope you feel better soon.  If you don't feel better by next week, please call back. (avs not given, due to epic being down at the time of ov)

## 2011-05-22 DIAGNOSIS — M542 Cervicalgia: Secondary | ICD-10-CM | POA: Diagnosis not present

## 2011-05-22 DIAGNOSIS — M545 Low back pain: Secondary | ICD-10-CM | POA: Diagnosis not present

## 2011-05-29 DIAGNOSIS — M542 Cervicalgia: Secondary | ICD-10-CM | POA: Diagnosis not present

## 2011-05-31 DIAGNOSIS — M545 Low back pain: Secondary | ICD-10-CM | POA: Diagnosis not present

## 2011-06-06 DIAGNOSIS — M545 Low back pain: Secondary | ICD-10-CM | POA: Diagnosis not present

## 2011-06-06 DIAGNOSIS — M542 Cervicalgia: Secondary | ICD-10-CM | POA: Diagnosis not present

## 2011-06-13 DIAGNOSIS — M542 Cervicalgia: Secondary | ICD-10-CM | POA: Diagnosis not present

## 2011-06-13 DIAGNOSIS — M545 Low back pain: Secondary | ICD-10-CM | POA: Diagnosis not present

## 2011-06-15 DIAGNOSIS — M542 Cervicalgia: Secondary | ICD-10-CM | POA: Diagnosis not present

## 2011-06-15 DIAGNOSIS — M545 Low back pain: Secondary | ICD-10-CM | POA: Diagnosis not present

## 2011-06-18 ENCOUNTER — Other Ambulatory Visit: Payer: Self-pay | Admitting: Internal Medicine

## 2011-06-18 DIAGNOSIS — M545 Low back pain: Secondary | ICD-10-CM | POA: Diagnosis not present

## 2011-06-18 DIAGNOSIS — M542 Cervicalgia: Secondary | ICD-10-CM | POA: Diagnosis not present

## 2011-06-20 DIAGNOSIS — M542 Cervicalgia: Secondary | ICD-10-CM | POA: Diagnosis not present

## 2011-06-20 DIAGNOSIS — M545 Low back pain: Secondary | ICD-10-CM | POA: Diagnosis not present

## 2011-06-26 DIAGNOSIS — M542 Cervicalgia: Secondary | ICD-10-CM | POA: Diagnosis not present

## 2011-06-26 DIAGNOSIS — M545 Low back pain: Secondary | ICD-10-CM | POA: Diagnosis not present

## 2011-06-27 DIAGNOSIS — H2510 Age-related nuclear cataract, unspecified eye: Secondary | ICD-10-CM | POA: Diagnosis not present

## 2011-06-28 DIAGNOSIS — M542 Cervicalgia: Secondary | ICD-10-CM | POA: Diagnosis not present

## 2011-06-28 DIAGNOSIS — M545 Low back pain: Secondary | ICD-10-CM | POA: Diagnosis not present

## 2011-07-02 DIAGNOSIS — M542 Cervicalgia: Secondary | ICD-10-CM | POA: Diagnosis not present

## 2011-07-02 DIAGNOSIS — M545 Low back pain: Secondary | ICD-10-CM | POA: Diagnosis not present

## 2011-07-11 DIAGNOSIS — M545 Low back pain: Secondary | ICD-10-CM | POA: Diagnosis not present

## 2011-07-11 DIAGNOSIS — M542 Cervicalgia: Secondary | ICD-10-CM | POA: Diagnosis not present

## 2011-07-13 DIAGNOSIS — M542 Cervicalgia: Secondary | ICD-10-CM | POA: Diagnosis not present

## 2011-07-13 DIAGNOSIS — M545 Low back pain: Secondary | ICD-10-CM | POA: Diagnosis not present

## 2011-07-16 DIAGNOSIS — M545 Low back pain: Secondary | ICD-10-CM | POA: Diagnosis not present

## 2011-07-16 DIAGNOSIS — M7020 Olecranon bursitis, unspecified elbow: Secondary | ICD-10-CM | POA: Diagnosis not present

## 2011-07-16 DIAGNOSIS — M542 Cervicalgia: Secondary | ICD-10-CM | POA: Diagnosis not present

## 2011-07-18 DIAGNOSIS — M542 Cervicalgia: Secondary | ICD-10-CM | POA: Diagnosis not present

## 2011-07-18 DIAGNOSIS — M545 Low back pain: Secondary | ICD-10-CM | POA: Diagnosis not present

## 2011-08-23 DIAGNOSIS — H1044 Vernal conjunctivitis: Secondary | ICD-10-CM | POA: Diagnosis not present

## 2011-09-05 ENCOUNTER — Other Ambulatory Visit: Payer: Self-pay | Admitting: *Deleted

## 2011-09-05 MED ORDER — ZOLPIDEM TARTRATE 10 MG PO TABS: 10.0000 mg | ORAL_TABLET | Freq: Every evening | ORAL | Status: DC | PRN

## 2011-09-05 NOTE — Telephone Encounter (Signed)
Pt is requesting renewal on his zolpidem for his mal service. MD out of office is this ok to refill?.. 09/05/11@9 :38am/LMB

## 2011-09-05 NOTE — Telephone Encounter (Signed)
[facility]... 09/05/11@10 :25am/LMB

## 2011-10-13 DIAGNOSIS — Z23 Encounter for immunization: Secondary | ICD-10-CM | POA: Diagnosis not present

## 2011-10-15 DIAGNOSIS — Z79899 Other long term (current) drug therapy: Secondary | ICD-10-CM | POA: Diagnosis not present

## 2011-10-17 DIAGNOSIS — M25549 Pain in joints of unspecified hand: Secondary | ICD-10-CM | POA: Diagnosis not present

## 2011-10-17 DIAGNOSIS — M25579 Pain in unspecified ankle and joints of unspecified foot: Secondary | ICD-10-CM | POA: Diagnosis not present

## 2011-10-17 DIAGNOSIS — M25569 Pain in unspecified knee: Secondary | ICD-10-CM | POA: Diagnosis not present

## 2011-10-17 DIAGNOSIS — M545 Low back pain: Secondary | ICD-10-CM | POA: Diagnosis not present

## 2011-10-29 DIAGNOSIS — M19049 Primary osteoarthritis, unspecified hand: Secondary | ICD-10-CM | POA: Diagnosis not present

## 2011-10-29 DIAGNOSIS — M25649 Stiffness of unspecified hand, not elsewhere classified: Secondary | ICD-10-CM | POA: Diagnosis not present

## 2011-10-29 DIAGNOSIS — M79609 Pain in unspecified limb: Secondary | ICD-10-CM | POA: Diagnosis not present

## 2011-10-30 ENCOUNTER — Other Ambulatory Visit: Payer: Self-pay

## 2011-10-30 MED ORDER — METOPROLOL SUCCINATE ER 100 MG PO TB24: 100.0000 mg | ORAL_TABLET | Freq: Every day | ORAL | Status: DC

## 2011-10-30 MED ORDER — ESOMEPRAZOLE MAGNESIUM 40 MG PO CPDR: 40.0000 mg | DELAYED_RELEASE_CAPSULE | Freq: Every day | ORAL | Status: DC

## 2011-11-07 DIAGNOSIS — M79609 Pain in unspecified limb: Secondary | ICD-10-CM | POA: Diagnosis not present

## 2011-11-07 DIAGNOSIS — M19049 Primary osteoarthritis, unspecified hand: Secondary | ICD-10-CM | POA: Diagnosis not present

## 2011-11-07 DIAGNOSIS — M25649 Stiffness of unspecified hand, not elsewhere classified: Secondary | ICD-10-CM | POA: Diagnosis not present

## 2011-11-21 DIAGNOSIS — M79609 Pain in unspecified limb: Secondary | ICD-10-CM | POA: Diagnosis not present

## 2011-11-21 DIAGNOSIS — M25649 Stiffness of unspecified hand, not elsewhere classified: Secondary | ICD-10-CM | POA: Diagnosis not present

## 2011-11-21 DIAGNOSIS — M19049 Primary osteoarthritis, unspecified hand: Secondary | ICD-10-CM | POA: Diagnosis not present

## 2011-11-23 ENCOUNTER — Other Ambulatory Visit: Payer: Self-pay | Admitting: *Deleted

## 2011-11-23 MED ORDER — TAMSULOSIN HCL 0.4 MG PO CAPS: ORAL_CAPSULE | ORAL | Status: DC

## 2011-11-23 NOTE — Telephone Encounter (Signed)
R'cd fax from [facility] for refill of Tamsulosin.

## 2011-11-30 ENCOUNTER — Encounter: Payer: Self-pay | Admitting: Internal Medicine

## 2011-11-30 ENCOUNTER — Ambulatory Visit (INDEPENDENT_AMBULATORY_CARE_PROVIDER_SITE_OTHER): Payer: Medicare Other | Admitting: Internal Medicine

## 2011-11-30 VITALS — BP 120/72 | HR 75 | Temp 98.5°F | Ht 72.0 in | Wt 205.4 lb

## 2011-11-30 DIAGNOSIS — Z23 Encounter for immunization: Secondary | ICD-10-CM

## 2011-11-30 DIAGNOSIS — R040 Epistaxis: Secondary | ICD-10-CM | POA: Diagnosis not present

## 2011-11-30 DIAGNOSIS — J3489 Other specified disorders of nose and nasal sinuses: Secondary | ICD-10-CM | POA: Diagnosis not present

## 2011-11-30 DIAGNOSIS — J342 Deviated nasal septum: Secondary | ICD-10-CM | POA: Diagnosis not present

## 2011-11-30 NOTE — Patient Instructions (Addendum)
It was good to see you today. We have reviewed your prior records including labs and tests today we'll make referral to ENT for evaluation of your recurrent nose bleed. Our office will contact you regarding appointment(s) once made. Nosebleed Nosebleeds can be caused by many conditions including trauma, infections, polyps, foreign bodies, dry mucous membranes or climate, medications and air conditioning. Most nosebleeds occur in the front of the nose. It is because of this location that most nosebleeds can be controlled by pinching the nostrils gently and continuously. Do this for at least 10 to 20 minutes. The reason for this long continuous pressure is that you must hold it long enough for the blood to clot. If during that 10 to 20 minute time period, pressure is released, the process may have to be started again. The nosebleed may stop by itself, quit with pressure, need concentrated heating (cautery) or stop with pressure from packing. HOME CARE INSTRUCTIONS    If your nose was packed, try to maintain the pack inside until your caregiver removes it. If a gauze pack was used and it starts to fall out, gently replace or cut the end off. Do not cut if a balloon catheter was used to pack the nose. Otherwise, do not remove unless instructed.   Avoid blowing your nose for 12 hours after treatment. This could dislodge the pack or clot and start bleeding again.   If the bleeding starts again, sit up and bending forward, gently pinch the front half of your nose continuously for 20 minutes.   If bleeding was caused by dry mucous membranes, cover the inside of your nose every morning with a petroleum or antibiotic ointment. Use your little fingertip as an applicator. Do this as needed during dry weather. This will keep the mucous membranes moist and allow them to heal.   Maintain humidity in your home by using less air conditioning or using a humidifier.   Do not use aspirin or medications which make  bleeding more likely. Your caregiver can give you recommendations on this.   Resume normal activities as able but try to avoid straining, lifting or bending at the waist for several days.   If the nosebleeds become recurrent and the cause is unknown, your caregiver may suggest laboratory tests.  SEEK IMMEDIATE MEDICAL CARE IF:    Bleeding recurs and cannot be controlled.   There is unusual bleeding from or bruising on other parts of the body.   You have a fever.   Nosebleeds continue.   There is any worsening of the condition which originally brought you in.   You become lightheaded, feel faint, become sweaty or vomit blood.  MAKE SURE YOU:    Understand these instructions.   Will watch your condition.   Will get help right away if you are not doing well or get worse.  Document Released: 10/11/2004 Document Revised: 03/26/2011 Document Reviewed: 12/03/2008 [facility] Patient Information 2013 [place], [place].

## 2011-11-30 NOTE — Progress Notes (Signed)
  Subjective:    Patient ID: Jose Meza, male    DOB: 1931-04-10, 78 y.o.   MRN: [id number]  HPI  complains of recurrent nose bleed, R nostril -  Intermittent symptoms since 04/2011 -  Denies picking, trauma or use of nose sprays Denies foreign body  Last bleed this AM, resolved with self applied packing pressure  Past Medical History  Diagnosis Date  . GERD (gastroesophageal reflux disease)   . Arthritis     neg rheum eval  . Hypertension   . BPH (benign prostatic hypertrophy)     recurrent prostatitis, hx BOO  . Chronic kidney disease     stage 2-3  . Hyperlipidemia   . Allergic rhinitis, seasonal   . IBS (irritable bowel syndrome)     constipation prone  . DJD (degenerative joint disease) of knee     right knee, s/p knee replacement 12/06  . Cauda equina, syndrome   . Polyp of colon, adenomatous 07/2009    buccini    Review of Systems  HENT: Negative for congestion, rhinorrhea and sinus pressure.   Respiratory: Negative for cough and shortness of breath.        Objective:   Physical Exam BP 120/72  Pulse 75  Temp 98.5 F (36.9 C) (Oral)  Ht 6' (1.829 m)  Wt 205 lb 6.4 oz (93.169 kg)  BMI 27.86 kg/m2  SpO2 98% Wt Readings from Last 3 Encounters:  11/30/11 205 lb 6.4 oz (93.169 kg)  05/18/11 202 lb (91.627 kg)  10/20/10 205 lb (92.987 kg)   Constitutional: he appears well-developed and well-nourished. No distress.  HENT: Head: Normocephalic and atraumatic. Ears: B TMs ok, no erythema or effusion; Nose: Small ulceration R nostril on anterior septum - Mouth/Throat: Oropharynx is clear and moist. No oropharyngeal exudate.  Eyes: Conjunctivae and EOM are normal. Pupils are equal, round, and reactive to light. No scleral icterus.  Neck: Normal range of motion. Neck supple. No JVD present. No thyromegaly present.  Cardiovascular: Normal rate, regular rhythm and normal heart sounds.  No murmur heard. No BLE edema. Pulmonary/Chest: Effort normal and breath sounds  normal. No respiratory distress. he has no wheezes.    Lab Results  Component Value Date   WBC 9.7 09/01/2010   HGB 14.8 09/01/2010   PLT 186 09/01/2010   CHOL 136 05/01/2010   TRIG 156 05/01/2010   HDL 35 05/01/2010   LDLCALC 69 05/01/2010   ALT 32 05/01/2010   AST 23 05/01/2010   NA 140 05/30/2010   K 4.5 05/30/2010   CREATININE 1.4* 09/01/2010   BUN 25* 09/01/2010   TSH 3.70 11/30/2010      Assessment & Plan:   Epistaxis - recurrent with chronic R nostril ulceration on exam Refer to ENT for tx of same - no active bleeding at this time

## 2012-01-22 ENCOUNTER — Telehealth: Payer: Self-pay | Admitting: *Deleted

## 2012-01-22 NOTE — Telephone Encounter (Signed)
Left msg on triage [facility] needing hard [facility] rx for Meprobramate...lmb

## 2012-01-23 MED ORDER — MEPROBAMATE 200 MG PO TABS: 200.0000 mg | ORAL_TABLET | Freq: Three times a day (TID) | ORAL | Status: DC | PRN

## 2012-01-23 NOTE — Telephone Encounter (Signed)
Called pt spoke with wife inform her rx ready for pick-up...Raechel Chute

## 2012-01-23 NOTE — Telephone Encounter (Signed)
done

## 2012-01-30 ENCOUNTER — Other Ambulatory Visit: Payer: Self-pay | Admitting: Internal Medicine

## 2012-02-19 ENCOUNTER — Other Ambulatory Visit: Payer: Self-pay | Admitting: *Deleted

## 2012-02-19 NOTE — Telephone Encounter (Signed)
[facility] request refill on patient medication Zolpidem 10 mg tab. #90. Please advise.

## 2012-02-19 NOTE — Telephone Encounter (Signed)
Request faxed to Dr. Felicity Coyer.

## 2012-02-19 NOTE — Telephone Encounter (Signed)
Please direct this request to pcp dr Felicity Coyer

## 2012-02-20 ENCOUNTER — Other Ambulatory Visit: Payer: Self-pay | Admitting: *Deleted

## 2012-02-20 MED ORDER — ZOLPIDEM TARTRATE 10 MG PO TABS: 10.0000 mg | ORAL_TABLET | Freq: Every evening | ORAL | Status: DC | PRN

## 2012-02-20 NOTE — Telephone Encounter (Signed)
[facility].../lmb 

## 2012-04-08 ENCOUNTER — Telehealth: Payer: Self-pay | Admitting: Internal Medicine

## 2012-04-08 NOTE — Telephone Encounter (Signed)
Pt is requesting refills on the generic for Lipitor.  He wants a 10 day supply sent to [facility] on [address].  He also wants a 90 day supply to go to [facility].

## 2012-04-09 MED ORDER — ATORVASTATIN CALCIUM 10 MG PO TABS: 10.0000 mg | ORAL_TABLET | Freq: Every day | ORAL | Status: DC

## 2012-04-09 NOTE — Telephone Encounter (Signed)
In[facility]...lmb

## 2012-04-16 DIAGNOSIS — M5030 Other cervical disc degeneration, unspecified cervical region: Secondary | ICD-10-CM | POA: Diagnosis not present

## 2012-04-16 DIAGNOSIS — M25549 Pain in joints of unspecified hand: Secondary | ICD-10-CM | POA: Diagnosis not present

## 2012-04-16 DIAGNOSIS — R300 Dysuria: Secondary | ICD-10-CM | POA: Diagnosis not present

## 2012-04-16 DIAGNOSIS — M5137 Other intervertebral disc degeneration, lumbosacral region: Secondary | ICD-10-CM | POA: Diagnosis not present

## 2012-04-16 DIAGNOSIS — R5381 Other malaise: Secondary | ICD-10-CM | POA: Diagnosis not present

## 2012-04-16 DIAGNOSIS — Z79899 Other long term (current) drug therapy: Secondary | ICD-10-CM | POA: Diagnosis not present

## 2012-04-16 DIAGNOSIS — M25569 Pain in unspecified knee: Secondary | ICD-10-CM | POA: Diagnosis not present

## 2012-04-16 DIAGNOSIS — R5383 Other fatigue: Secondary | ICD-10-CM | POA: Diagnosis not present

## 2012-04-16 DIAGNOSIS — M2550 Pain in unspecified joint: Secondary | ICD-10-CM | POA: Diagnosis not present

## 2012-04-16 DIAGNOSIS — Z111 Encounter for screening for respiratory tuberculosis: Secondary | ICD-10-CM | POA: Diagnosis not present

## 2012-04-18 DIAGNOSIS — L989 Disorder of the skin and subcutaneous tissue, unspecified: Secondary | ICD-10-CM | POA: Diagnosis not present

## 2012-04-18 DIAGNOSIS — D485 Neoplasm of uncertain behavior of skin: Secondary | ICD-10-CM | POA: Diagnosis not present

## 2012-04-28 DIAGNOSIS — M2550 Pain in unspecified joint: Secondary | ICD-10-CM | POA: Diagnosis not present

## 2012-04-30 DIAGNOSIS — M2550 Pain in unspecified joint: Secondary | ICD-10-CM | POA: Diagnosis not present

## 2012-05-05 DIAGNOSIS — M2550 Pain in unspecified joint: Secondary | ICD-10-CM | POA: Diagnosis not present

## 2012-05-09 DIAGNOSIS — M069 Rheumatoid arthritis, unspecified: Secondary | ICD-10-CM | POA: Diagnosis not present

## 2012-05-09 DIAGNOSIS — M1710 Unilateral primary osteoarthritis, unspecified knee: Secondary | ICD-10-CM | POA: Diagnosis not present

## 2012-05-09 DIAGNOSIS — M5137 Other intervertebral disc degeneration, lumbosacral region: Secondary | ICD-10-CM | POA: Diagnosis not present

## 2012-05-09 DIAGNOSIS — M2550 Pain in unspecified joint: Secondary | ICD-10-CM | POA: Diagnosis not present

## 2012-05-14 DIAGNOSIS — H2510 Age-related nuclear cataract, unspecified eye: Secondary | ICD-10-CM | POA: Diagnosis not present

## 2012-06-06 DIAGNOSIS — Z79899 Other long term (current) drug therapy: Secondary | ICD-10-CM | POA: Diagnosis not present

## 2012-06-10 DIAGNOSIS — R339 Retention of urine, unspecified: Secondary | ICD-10-CM | POA: Diagnosis not present

## 2012-06-10 DIAGNOSIS — N400 Enlarged prostate without lower urinary tract symptoms: Secondary | ICD-10-CM | POA: Diagnosis not present

## 2012-06-18 DIAGNOSIS — H2510 Age-related nuclear cataract, unspecified eye: Secondary | ICD-10-CM | POA: Diagnosis not present

## 2012-06-18 DIAGNOSIS — H26499 Other secondary cataract, unspecified eye: Secondary | ICD-10-CM | POA: Diagnosis not present

## 2012-06-26 ENCOUNTER — Telehealth: Payer: Self-pay | Admitting: Internal Medicine

## 2012-06-26 MED ORDER — ESOMEPRAZOLE MAGNESIUM 40 MG PO CPDR: 40.0000 mg | DELAYED_RELEASE_CAPSULE | Freq: Every day | ORAL | Status: DC

## 2012-06-26 NOTE — Telephone Encounter (Signed)
Pt called req rx for nexium to be [facility] to [facility]. Please advise.

## 2012-07-02 DIAGNOSIS — M19049 Primary osteoarthritis, unspecified hand: Secondary | ICD-10-CM | POA: Diagnosis not present

## 2012-07-02 DIAGNOSIS — M5137 Other intervertebral disc degeneration, lumbosacral region: Secondary | ICD-10-CM | POA: Diagnosis not present

## 2012-07-02 DIAGNOSIS — M069 Rheumatoid arthritis, unspecified: Secondary | ICD-10-CM | POA: Diagnosis not present

## 2012-07-02 DIAGNOSIS — M1710 Unilateral primary osteoarthritis, unspecified knee: Secondary | ICD-10-CM | POA: Diagnosis not present

## 2012-07-14 DIAGNOSIS — H25049 Posterior subcapsular polar age-related cataract, unspecified eye: Secondary | ICD-10-CM | POA: Diagnosis not present

## 2012-07-14 DIAGNOSIS — H2510 Age-related nuclear cataract, unspecified eye: Secondary | ICD-10-CM | POA: Diagnosis not present

## 2012-07-25 DIAGNOSIS — H2510 Age-related nuclear cataract, unspecified eye: Secondary | ICD-10-CM | POA: Diagnosis not present

## 2012-08-04 DIAGNOSIS — H21569 Pupillary abnormality, unspecified eye: Secondary | ICD-10-CM | POA: Diagnosis not present

## 2012-08-04 DIAGNOSIS — H2510 Age-related nuclear cataract, unspecified eye: Secondary | ICD-10-CM | POA: Diagnosis not present

## 2012-08-04 DIAGNOSIS — H25049 Posterior subcapsular polar age-related cataract, unspecified eye: Secondary | ICD-10-CM | POA: Diagnosis not present

## 2012-08-08 DIAGNOSIS — Z79899 Other long term (current) drug therapy: Secondary | ICD-10-CM | POA: Diagnosis not present

## 2012-08-15 DIAGNOSIS — M1710 Unilateral primary osteoarthritis, unspecified knee: Secondary | ICD-10-CM | POA: Diagnosis not present

## 2012-08-15 DIAGNOSIS — M19049 Primary osteoarthritis, unspecified hand: Secondary | ICD-10-CM | POA: Diagnosis not present

## 2012-08-15 DIAGNOSIS — M069 Rheumatoid arthritis, unspecified: Secondary | ICD-10-CM | POA: Diagnosis not present

## 2012-08-15 DIAGNOSIS — M19079 Primary osteoarthritis, unspecified ankle and foot: Secondary | ICD-10-CM | POA: Diagnosis not present

## 2012-08-29 DIAGNOSIS — Z79899 Other long term (current) drug therapy: Secondary | ICD-10-CM | POA: Diagnosis not present

## 2012-09-03 ENCOUNTER — Telehealth: Payer: Self-pay | Admitting: *Deleted

## 2012-09-03 MED ORDER — ATORVASTATIN CALCIUM 10 MG PO TABS: 10.0000 mg | ORAL_TABLET | Freq: Every day | ORAL | Status: DC

## 2012-09-03 NOTE — Telephone Encounter (Signed)
rx sent pt notified 

## 2012-09-12 DIAGNOSIS — Z79899 Other long term (current) drug therapy: Secondary | ICD-10-CM | POA: Diagnosis not present

## 2012-10-07 DIAGNOSIS — H1044 Vernal conjunctivitis: Secondary | ICD-10-CM | POA: Diagnosis not present

## 2012-10-20 DIAGNOSIS — Z23 Encounter for immunization: Secondary | ICD-10-CM | POA: Diagnosis not present

## 2012-10-27 ENCOUNTER — Other Ambulatory Visit: Payer: Self-pay | Admitting: *Deleted

## 2012-10-27 MED ORDER — METOPROLOL SUCCINATE ER 100 MG PO TB24: ORAL_TABLET | ORAL | Status: DC

## 2012-10-27 MED ORDER — ZOLPIDEM TARTRATE 10 MG PO TABS: 10.0000 mg | ORAL_TABLET | Freq: Every evening | ORAL | Status: DC | PRN

## 2012-10-27 MED ORDER — MEPROBAMATE 200 MG PO TABS: 200.0000 mg | ORAL_TABLET | Freq: Three times a day (TID) | ORAL | Status: DC | PRN

## 2012-10-30 DIAGNOSIS — Z13220 Encounter for screening for lipoid disorders: Secondary | ICD-10-CM | POA: Diagnosis not present

## 2012-10-30 DIAGNOSIS — Z79899 Other long term (current) drug therapy: Secondary | ICD-10-CM | POA: Diagnosis not present

## 2012-10-31 DIAGNOSIS — M19049 Primary osteoarthritis, unspecified hand: Secondary | ICD-10-CM | POA: Diagnosis not present

## 2012-10-31 DIAGNOSIS — M5030 Other cervical disc degeneration, unspecified cervical region: Secondary | ICD-10-CM | POA: Diagnosis not present

## 2012-10-31 DIAGNOSIS — M19079 Primary osteoarthritis, unspecified ankle and foot: Secondary | ICD-10-CM | POA: Diagnosis not present

## 2012-10-31 DIAGNOSIS — M069 Rheumatoid arthritis, unspecified: Secondary | ICD-10-CM | POA: Diagnosis not present

## 2012-11-06 ENCOUNTER — Encounter: Payer: Self-pay | Admitting: Internal Medicine

## 2012-11-12 DIAGNOSIS — M5137 Other intervertebral disc degeneration, lumbosacral region: Secondary | ICD-10-CM | POA: Diagnosis not present

## 2012-11-12 DIAGNOSIS — R262 Difficulty in walking, not elsewhere classified: Secondary | ICD-10-CM | POA: Diagnosis not present

## 2012-11-21 DIAGNOSIS — M5137 Other intervertebral disc degeneration, lumbosacral region: Secondary | ICD-10-CM | POA: Diagnosis not present

## 2012-11-21 DIAGNOSIS — R262 Difficulty in walking, not elsewhere classified: Secondary | ICD-10-CM | POA: Diagnosis not present

## 2012-11-26 DIAGNOSIS — R262 Difficulty in walking, not elsewhere classified: Secondary | ICD-10-CM | POA: Diagnosis not present

## 2012-11-26 DIAGNOSIS — M5137 Other intervertebral disc degeneration, lumbosacral region: Secondary | ICD-10-CM | POA: Diagnosis not present

## 2012-12-07 ENCOUNTER — Other Ambulatory Visit: Payer: Self-pay | Admitting: Internal Medicine

## 2012-12-26 ENCOUNTER — Other Ambulatory Visit (HOSPITAL_BASED_OUTPATIENT_CLINIC_OR_DEPARTMENT_OTHER): Payer: Self-pay | Admitting: Rheumatology

## 2012-12-26 ENCOUNTER — Other Ambulatory Visit (HOSPITAL_COMMUNITY): Payer: Self-pay | Admitting: Interventional Radiology

## 2012-12-26 DIAGNOSIS — R2689 Other abnormalities of gait and mobility: Secondary | ICD-10-CM

## 2012-12-30 ENCOUNTER — Ambulatory Visit (INDEPENDENT_AMBULATORY_CARE_PROVIDER_SITE_OTHER): Payer: Medicare Other

## 2012-12-30 ENCOUNTER — Other Ambulatory Visit: Payer: Medicare Other

## 2012-12-30 DIAGNOSIS — M4802 Spinal stenosis, cervical region: Secondary | ICD-10-CM

## 2012-12-30 DIAGNOSIS — M47812 Spondylosis without myelopathy or radiculopathy, cervical region: Secondary | ICD-10-CM

## 2012-12-30 DIAGNOSIS — M5030 Other cervical disc degeneration, unspecified cervical region: Secondary | ICD-10-CM | POA: Diagnosis not present

## 2012-12-30 DIAGNOSIS — M5090 Cervical disc disorder, unspecified, unspecified cervical region: Secondary | ICD-10-CM

## 2012-12-30 DIAGNOSIS — R2689 Other abnormalities of gait and mobility: Secondary | ICD-10-CM

## 2013-01-02 DIAGNOSIS — R262 Difficulty in walking, not elsewhere classified: Secondary | ICD-10-CM | POA: Diagnosis not present

## 2013-01-02 DIAGNOSIS — M5137 Other intervertebral disc degeneration, lumbosacral region: Secondary | ICD-10-CM | POA: Diagnosis not present

## 2013-01-05 DIAGNOSIS — M5137 Other intervertebral disc degeneration, lumbosacral region: Secondary | ICD-10-CM | POA: Diagnosis not present

## 2013-01-05 DIAGNOSIS — R262 Difficulty in walking, not elsewhere classified: Secondary | ICD-10-CM | POA: Diagnosis not present

## 2013-01-12 DIAGNOSIS — R262 Difficulty in walking, not elsewhere classified: Secondary | ICD-10-CM | POA: Diagnosis not present

## 2013-01-12 DIAGNOSIS — M5137 Other intervertebral disc degeneration, lumbosacral region: Secondary | ICD-10-CM | POA: Diagnosis not present

## 2013-02-04 DIAGNOSIS — M5137 Other intervertebral disc degeneration, lumbosacral region: Secondary | ICD-10-CM | POA: Diagnosis not present

## 2013-02-04 DIAGNOSIS — R262 Difficulty in walking, not elsewhere classified: Secondary | ICD-10-CM | POA: Diagnosis not present

## 2013-02-11 DIAGNOSIS — R262 Difficulty in walking, not elsewhere classified: Secondary | ICD-10-CM | POA: Diagnosis not present

## 2013-02-11 DIAGNOSIS — M5137 Other intervertebral disc degeneration, lumbosacral region: Secondary | ICD-10-CM | POA: Diagnosis not present

## 2013-02-13 DIAGNOSIS — R262 Difficulty in walking, not elsewhere classified: Secondary | ICD-10-CM | POA: Diagnosis not present

## 2013-02-13 DIAGNOSIS — M5137 Other intervertebral disc degeneration, lumbosacral region: Secondary | ICD-10-CM | POA: Diagnosis not present

## 2013-02-20 DIAGNOSIS — R262 Difficulty in walking, not elsewhere classified: Secondary | ICD-10-CM | POA: Diagnosis not present

## 2013-02-20 DIAGNOSIS — M5137 Other intervertebral disc degeneration, lumbosacral region: Secondary | ICD-10-CM | POA: Diagnosis not present

## 2013-02-23 DIAGNOSIS — R262 Difficulty in walking, not elsewhere classified: Secondary | ICD-10-CM | POA: Diagnosis not present

## 2013-02-23 DIAGNOSIS — M5137 Other intervertebral disc degeneration, lumbosacral region: Secondary | ICD-10-CM | POA: Diagnosis not present

## 2013-02-25 DIAGNOSIS — M48061 Spinal stenosis, lumbar region without neurogenic claudication: Secondary | ICD-10-CM | POA: Diagnosis not present

## 2013-02-25 DIAGNOSIS — Z6829 Body mass index (BMI) 29.0-29.9, adult: Secondary | ICD-10-CM | POA: Diagnosis not present

## 2013-02-27 ENCOUNTER — Other Ambulatory Visit: Payer: Self-pay | Admitting: Neurological Surgery

## 2013-02-27 DIAGNOSIS — R262 Difficulty in walking, not elsewhere classified: Secondary | ICD-10-CM | POA: Diagnosis not present

## 2013-02-27 DIAGNOSIS — M5126 Other intervertebral disc displacement, lumbar region: Secondary | ICD-10-CM

## 2013-02-27 DIAGNOSIS — M5137 Other intervertebral disc degeneration, lumbosacral region: Secondary | ICD-10-CM | POA: Diagnosis not present

## 2013-03-02 DIAGNOSIS — M5137 Other intervertebral disc degeneration, lumbosacral region: Secondary | ICD-10-CM | POA: Diagnosis not present

## 2013-03-02 DIAGNOSIS — R262 Difficulty in walking, not elsewhere classified: Secondary | ICD-10-CM | POA: Diagnosis not present

## 2013-03-04 DIAGNOSIS — M5137 Other intervertebral disc degeneration, lumbosacral region: Secondary | ICD-10-CM | POA: Diagnosis not present

## 2013-03-04 DIAGNOSIS — R262 Difficulty in walking, not elsewhere classified: Secondary | ICD-10-CM | POA: Diagnosis not present

## 2013-03-09 ENCOUNTER — Other Ambulatory Visit: Payer: Self-pay | Admitting: Internal Medicine

## 2013-03-09 ENCOUNTER — Ambulatory Visit
Admission: RE | Admit: 2013-03-09 | Discharge: 2013-03-09 | Disposition: A | Discharge status: Home / Self Care | Payer: Medicare Other | Source: Ambulatory Visit | Attending: Neurological Surgery | Admitting: Neurological Surgery

## 2013-03-09 DIAGNOSIS — M47817 Spondylosis without myelopathy or radiculopathy, lumbosacral region: Secondary | ICD-10-CM | POA: Diagnosis not present

## 2013-03-09 DIAGNOSIS — M5126 Other intervertebral disc displacement, lumbar region: Secondary | ICD-10-CM

## 2013-03-09 MED ORDER — GADOBENATE DIMEGLUMINE 529 MG/ML IV SOLN
20.0000 mL | Freq: Once | INTRAVENOUS | Status: AC | PRN
  Administered 2013-03-09: 20 mL via INTRAVENOUS

## 2013-03-18 DIAGNOSIS — M5137 Other intervertebral disc degeneration, lumbosacral region: Secondary | ICD-10-CM | POA: Diagnosis not present

## 2013-03-18 DIAGNOSIS — R262 Difficulty in walking, not elsewhere classified: Secondary | ICD-10-CM | POA: Diagnosis not present

## 2013-03-20 DIAGNOSIS — R262 Difficulty in walking, not elsewhere classified: Secondary | ICD-10-CM | POA: Diagnosis not present

## 2013-03-20 DIAGNOSIS — M5137 Other intervertebral disc degeneration, lumbosacral region: Secondary | ICD-10-CM | POA: Diagnosis not present

## 2013-03-23 DIAGNOSIS — M5137 Other intervertebral disc degeneration, lumbosacral region: Secondary | ICD-10-CM | POA: Diagnosis not present

## 2013-03-23 DIAGNOSIS — R262 Difficulty in walking, not elsewhere classified: Secondary | ICD-10-CM | POA: Diagnosis not present

## 2013-03-25 DIAGNOSIS — M48061 Spinal stenosis, lumbar region without neurogenic claudication: Secondary | ICD-10-CM | POA: Diagnosis not present

## 2013-03-25 DIAGNOSIS — I10 Essential (primary) hypertension: Secondary | ICD-10-CM | POA: Diagnosis not present

## 2013-03-25 DIAGNOSIS — Z6825 Body mass index (BMI) 25.0-25.9, adult: Secondary | ICD-10-CM | POA: Diagnosis not present

## 2013-03-27 DIAGNOSIS — R262 Difficulty in walking, not elsewhere classified: Secondary | ICD-10-CM | POA: Diagnosis not present

## 2013-03-27 DIAGNOSIS — M5137 Other intervertebral disc degeneration, lumbosacral region: Secondary | ICD-10-CM | POA: Diagnosis not present

## 2013-03-30 DIAGNOSIS — R262 Difficulty in walking, not elsewhere classified: Secondary | ICD-10-CM | POA: Diagnosis not present

## 2013-03-30 DIAGNOSIS — M5137 Other intervertebral disc degeneration, lumbosacral region: Secondary | ICD-10-CM | POA: Diagnosis not present

## 2013-04-01 DIAGNOSIS — M5137 Other intervertebral disc degeneration, lumbosacral region: Secondary | ICD-10-CM | POA: Diagnosis not present

## 2013-04-01 DIAGNOSIS — R262 Difficulty in walking, not elsewhere classified: Secondary | ICD-10-CM | POA: Diagnosis not present

## 2013-04-02 ENCOUNTER — Telehealth: Payer: Self-pay | Admitting: Internal Medicine

## 2013-04-02 MED ORDER — ESOMEPRAZOLE MAGNESIUM 40 MG PO CPDR: DELAYED_RELEASE_CAPSULE | ORAL | Status: DC

## 2013-04-02 NOTE — Telephone Encounter (Signed)
Pt is almost out of nexium.  [facility] uses [facility].  LOV was in 2013.  He now has an appt for a physical on April 24.  Could he have an RX to last until then?

## 2013-04-02 NOTE — Telephone Encounter (Signed)
I will send him a 30 say supply tp his local pharmacy [facility] until he come in for appt then we can send 90 to [facility]. Pls inform him of this information...Jose Meza

## 2013-04-02 NOTE — Telephone Encounter (Signed)
LMOM to call back

## 2013-04-02 NOTE — Telephone Encounter (Signed)
Pt is aware.  

## 2013-04-03 DIAGNOSIS — Z09 Encounter for follow-up examination after completed treatment for conditions other than malignant neoplasm: Secondary | ICD-10-CM | POA: Diagnosis not present

## 2013-04-03 DIAGNOSIS — Z111 Encounter for screening for respiratory tuberculosis: Secondary | ICD-10-CM | POA: Diagnosis not present

## 2013-04-03 DIAGNOSIS — Z79899 Other long term (current) drug therapy: Secondary | ICD-10-CM | POA: Diagnosis not present

## 2013-04-03 LAB — HEPATIC FUNCTION PANEL
ALK PHOS: 69 U/L (ref 25–125)
ALT: 31 U/L (ref 10–40)
AST: 22 U/L (ref 14–40)
Bilirubin, Total: 0.5 mg/dL

## 2013-04-03 LAB — BASIC METABOLIC PANEL
BUN: 24 mg/dL — AB (ref 4–21)
CREATININE: 1.5 mg/dL — AB (ref 0.6–1.3)
Glucose: 88 mg/dL
Potassium: 4.7 mmol/L (ref 3.4–5.3)
SODIUM: 140 mmol/L (ref 137–147)

## 2013-04-03 LAB — CBC AND DIFFERENTIAL
HEMATOCRIT: 45 % (ref 41–53)
Hemoglobin: 15.7 g/dL (ref 13.5–17.5)
Platelets: 190 10*3/uL (ref 150–399)

## 2013-04-03 LAB — LIPID PANEL
CHOLESTEROL: 158 mg/dL (ref 0–200)
HDL: 39 mg/dL (ref 35–70)
LDL Cholesterol: 88 mg/dL
TRIGLYCERIDES: 153 mg/dL (ref 40–160)

## 2013-04-06 DIAGNOSIS — M25549 Pain in joints of unspecified hand: Secondary | ICD-10-CM | POA: Diagnosis not present

## 2013-04-06 DIAGNOSIS — M5137 Other intervertebral disc degeneration, lumbosacral region: Secondary | ICD-10-CM | POA: Diagnosis not present

## 2013-04-06 DIAGNOSIS — M069 Rheumatoid arthritis, unspecified: Secondary | ICD-10-CM | POA: Diagnosis not present

## 2013-04-06 DIAGNOSIS — Z09 Encounter for follow-up examination after completed treatment for conditions other than malignant neoplasm: Secondary | ICD-10-CM | POA: Diagnosis not present

## 2013-04-06 DIAGNOSIS — R262 Difficulty in walking, not elsewhere classified: Secondary | ICD-10-CM | POA: Diagnosis not present

## 2013-04-06 DIAGNOSIS — M542 Cervicalgia: Secondary | ICD-10-CM | POA: Diagnosis not present

## 2013-04-07 ENCOUNTER — Encounter: Payer: Self-pay | Admitting: Internal Medicine

## 2013-04-09 ENCOUNTER — Emergency Department (HOSPITAL_COMMUNITY)
Admission: EM | Admit: 2013-04-09 | Discharge: 2013-04-09 | Disposition: A | Discharge status: Home / Self Care | Payer: Medicare Other | Attending: Emergency Medicine | Admitting: Emergency Medicine

## 2013-04-09 ENCOUNTER — Encounter (HOSPITAL_COMMUNITY): Payer: Self-pay | Admitting: Emergency Medicine

## 2013-04-09 DIAGNOSIS — N183 Chronic kidney disease, stage 3 unspecified: Secondary | ICD-10-CM | POA: Insufficient documentation

## 2013-04-09 DIAGNOSIS — R339 Retention of urine, unspecified: Secondary | ICD-10-CM | POA: Diagnosis not present

## 2013-04-09 DIAGNOSIS — K219 Gastro-esophageal reflux disease without esophagitis: Secondary | ICD-10-CM | POA: Insufficient documentation

## 2013-04-09 DIAGNOSIS — Z87891 Personal history of nicotine dependence: Secondary | ICD-10-CM | POA: Insufficient documentation

## 2013-04-09 DIAGNOSIS — N401 Enlarged prostate with lower urinary tract symptoms: Secondary | ICD-10-CM | POA: Diagnosis not present

## 2013-04-09 DIAGNOSIS — N400 Enlarged prostate without lower urinary tract symptoms: Secondary | ICD-10-CM | POA: Diagnosis not present

## 2013-04-09 DIAGNOSIS — Z860100 Personal history of colon polyps, unspecified: Secondary | ICD-10-CM | POA: Insufficient documentation

## 2013-04-09 DIAGNOSIS — E785 Hyperlipidemia, unspecified: Secondary | ICD-10-CM | POA: Diagnosis not present

## 2013-04-09 DIAGNOSIS — Z8739 Personal history of other diseases of the musculoskeletal system and connective tissue: Secondary | ICD-10-CM | POA: Insufficient documentation

## 2013-04-09 DIAGNOSIS — I10 Essential (primary) hypertension: Secondary | ICD-10-CM | POA: Diagnosis not present

## 2013-04-09 DIAGNOSIS — Z79899 Other long term (current) drug therapy: Secondary | ICD-10-CM | POA: Diagnosis not present

## 2013-04-09 DIAGNOSIS — I129 Hypertensive chronic kidney disease with stage 1 through stage 4 chronic kidney disease, or unspecified chronic kidney disease: Secondary | ICD-10-CM | POA: Insufficient documentation

## 2013-04-09 DIAGNOSIS — N138 Other obstructive and reflux uropathy: Secondary | ICD-10-CM | POA: Insufficient documentation

## 2013-04-09 DIAGNOSIS — Z8709 Personal history of other diseases of the respiratory system: Secondary | ICD-10-CM | POA: Diagnosis not present

## 2013-04-09 LAB — URINALYSIS, ROUTINE W REFLEX MICROSCOPIC
Bilirubin Urine: NEGATIVE
Glucose, UA: NEGATIVE mg/dL
Ketones, ur: 15 mg/dL — AB
Nitrite: NEGATIVE
Protein, ur: >300 mg/dL — AB
Specific Gravity, Urine: 1.023 (ref 1.005–1.030)
Urobilinogen, UA: 0.2 mg/dL (ref 0.0–1.0)
pH: 5.5 (ref 5.0–8.0)

## 2013-04-09 LAB — URINE MICROSCOPIC-ADD ON

## 2013-04-09 MED ORDER — TAMSULOSIN HCL 0.4 MG PO CAPS
0.4000 mg | ORAL_CAPSULE | Freq: Once | ORAL | Status: AC
  Administered 2013-04-09: 0.4 mg via ORAL
  Filled 2013-04-09: qty 1

## 2013-04-09 MED ORDER — HYDROCODONE-ACETAMINOPHEN 5-325 MG PO TABS: 1.0000 | ORAL_TABLET | Freq: Once | ORAL | Status: DC

## 2013-04-09 MED ORDER — TAMSULOSIN HCL 0.4 MG PO CAPS: 0.4000 mg | ORAL_CAPSULE | Freq: Every day | ORAL | Status: AC

## 2013-04-09 MED ORDER — CIPROFLOXACIN HCL 500 MG PO TABS: 500.0000 mg | ORAL_TABLET | Freq: Two times a day (BID) | ORAL | Status: DC

## 2013-04-09 NOTE — ED Provider Notes (Signed)
CSN: [id number]     Arrival date & time 04/09/13  1627 History   First MD Initiated Contact with Patient 04/09/13 1745     Chief Complaint  Patient presents with  . Urinary Retention     (Consider location/radiation/quality/duration/timing/severity/associated sxs/prior Treatment) HPI: Mr. Pink is a 80 year old man with past medical history of recurrent prostatitis and benign prostatic hypertrophy who presents to the Emergency Department with chief complaint of urinary retention.  He reports that he developed a fever yesterday that he has measured up to 100.6 F and that the last time he was able to fully empty his bladder was yesterday morning.  He has been "dribbling" urine all of today.  He called his urologist, Dr. Risa Grill, and was told by his office staff to come to the ED.  He has been taking aspirin to control the fever.  He takes proscar for his BPH.     Past Medical History  Diagnosis Date  . GERD (gastroesophageal reflux disease)   . Arthritis     neg rheum eval  . Hypertension   . BPH (benign prostatic hypertrophy)     recurrent prostatitis, hx BOO  . Chronic kidney disease     stage 2-3  . Hyperlipidemia   . Allergic rhinitis, seasonal   . IBS (irritable bowel syndrome)     constipation prone  . DJD (degenerative joint disease) of knee     right knee, s/p knee replacement 12/06  . Cauda equina, syndrome   . Polyp of colon, adenomatous 07/2009    buccini   Past Surgical History  Procedure Laterality Date  . Joint replacement  12/2004    R TKR  . Spine surgery  1974  . Right knee replacement  2007  . Ulner tranpos  2009   Family History  Problem Relation Age of Onset  . Colon cancer Mother    History  Substance Use Topics  . Smoking status: Former Smoker -- 2.00 packs/day for 7 years    Quit date: 01/15/1961  . Smokeless tobacco: Never Used  . Alcohol Use: Yes     Comment: Les than 1 per day    Review of Systems    Allergies  Sulfa antibiotics  Home  Medications   Current Outpatient Rx  Name  Route  Sig  Dispense  Refill  . atorvastatin (LIPITOR) 10 MG tablet   Oral   Take 1 tablet (10 mg total) by mouth daily.   90 tablet   2   . b complex vitamins tablet   Oral   Take 1 tablet by mouth daily.         . carisoprodol (SOMA) 350 MG tablet   Oral   Take 350 mg by mouth daily as needed for muscle spasms.         . cholecalciferol (VITAMIN D) 1000 UNITS tablet   Oral   Take 1,000 Units by mouth daily.         Marland Kitchen esomeprazole (NEXIUM) 40 MG capsule   Oral   Take 40 mg by mouth at bedtime.         . finasteride (PROSCAR) 5 MG tablet   Oral   Take 5 mg by mouth daily.           Marland Kitchen glucosamine-chondroitin 500-400 MG tablet   Oral   Take 1 tablet by mouth 3 (three) times daily.         . Lutein 20 MG CAPS   Oral  Take 20 mg by mouth daily.         . meprobamate (EQUANIL) 200 MG tablet   Oral   Take 400 mg by mouth at bedtime as needed (pain.).         Marland Kitchen metoprolol succinate (TOPROL-XL) 100 MG 24 hr tablet   Oral   Take 100 mg by mouth daily. Take with or immediately following a meal.         . Tofacitinib Citrate (XELJANZ) 5 MG TABS   Oral   Take 5 mg by mouth 2 (two) times daily.         . vitamin C (ASCORBIC ACID) 500 MG tablet   Oral   Take 4,000 mg by mouth daily.         Marland Kitchen zolpidem (AMBIEN) 5 MG tablet   Oral   Take 5 mg by mouth at bedtime as needed for sleep.         Marland Kitchen EXPIRED: amitriptyline (ELAVIL) 10 MG tablet   Oral   Take 1 tablet (10 mg total) by mouth at bedtime.   30 tablet   3    There were no vitals taken for this visit. Physical Exam  Nursing note and vitals reviewed. Constitutional: He is oriented to person, place, and time. He appears well-developed and well-nourished. No distress.  HENT:  Head: Normocephalic and atraumatic.  Mouth/Throat: Oropharynx is clear and moist.  Eyes: Pupils are equal, round, and reactive to light.  Cardiovascular: Normal rate,  regular rhythm and normal heart sounds.  Exam reveals no gallop and no friction rub.   No murmur heard. Pulmonary/Chest: Effort normal and breath sounds normal. No respiratory distress.  Abdominal: Soft. Bowel sounds are normal. He exhibits no distension. There is no tenderness. There is no guarding.  Neurological: He is alert and oriented to person, place, and time.  Skin: Skin is warm and dry.    ED Course  Procedures (including critical care time) Labs Review Labs Reviewed - No data to display I spoke with Dr. Risa Grill advised to have the patient.  Followup in his office, along with performing a urinalysis, and leaving a Foley catheter in place.  Patient is advised of plan.  Told to return here as needed  Brent General, PA-C 04/09/13 1957

## 2013-04-09 NOTE — Discharge Instructions (Signed)
Return here as needed. Call Dr. Cy Blamer office for an appointment as soon as they can see you. Increase your fluid intake.

## 2013-04-09 NOTE — ED Notes (Signed)
Cath placed with no problems small amount of blood noted on insertion.

## 2013-04-09 NOTE — ED Notes (Signed)
Pt c/o urinary retention. Pt states that he has chronic prostititis and when he gets a fever, he soon develops urinary retention. Pt states he developed a fever last night and now is just "dribbling urine". Pt is requesting a catheter. Pt states he is not very uncomfortable now, but is worried that he will be soon. Pt ambulatory to exam room with steady gait.

## 2013-04-09 NOTE — ED Notes (Signed)
Upon discharge noted that patient has gross hematuria from cathter that was placed today. Cath flushed and clots noted. Will inform EDP.

## 2013-04-10 ENCOUNTER — Other Ambulatory Visit: Payer: Self-pay | Admitting: Internal Medicine

## 2013-04-10 LAB — URINE CULTURE
Colony Count: NO GROWTH
Culture: NO GROWTH

## 2013-04-10 NOTE — ED Provider Notes (Signed)
Medical screening examination/treatment/procedure(s) were performed by non-physician practitioner and as supervising physician I was immediately available for consultation/collaboration.   EKG Interpretation None        Blanchard Kelch, MD 04/10/13 1136

## 2013-04-13 DIAGNOSIS — R339 Retention of urine, unspecified: Secondary | ICD-10-CM | POA: Diagnosis not present

## 2013-04-14 DIAGNOSIS — N400 Enlarged prostate without lower urinary tract symptoms: Secondary | ICD-10-CM | POA: Diagnosis not present

## 2013-04-14 DIAGNOSIS — R339 Retention of urine, unspecified: Secondary | ICD-10-CM | POA: Diagnosis not present

## 2013-04-16 DIAGNOSIS — M25519 Pain in unspecified shoulder: Secondary | ICD-10-CM | POA: Diagnosis not present

## 2013-04-22 ENCOUNTER — Other Ambulatory Visit: Payer: Self-pay | Admitting: Orthopaedic Surgery

## 2013-04-22 ENCOUNTER — Ambulatory Visit
Admission: RE | Admit: 2013-04-22 | Discharge: 2013-04-22 | Disposition: A | Discharge status: Home / Self Care | Payer: Medicare Other | Source: Ambulatory Visit | Attending: Orthopaedic Surgery | Admitting: Orthopaedic Surgery

## 2013-04-22 DIAGNOSIS — M25552 Pain in left hip: Secondary | ICD-10-CM

## 2013-04-22 DIAGNOSIS — S79919A Unspecified injury of unspecified hip, initial encounter: Secondary | ICD-10-CM | POA: Diagnosis not present

## 2013-04-22 DIAGNOSIS — M25559 Pain in unspecified hip: Secondary | ICD-10-CM | POA: Diagnosis not present

## 2013-04-22 DIAGNOSIS — S79929A Unspecified injury of unspecified thigh, initial encounter: Secondary | ICD-10-CM | POA: Diagnosis not present

## 2013-04-23 DIAGNOSIS — M25559 Pain in unspecified hip: Secondary | ICD-10-CM | POA: Diagnosis not present

## 2013-04-28 DIAGNOSIS — N400 Enlarged prostate without lower urinary tract symptoms: Secondary | ICD-10-CM | POA: Diagnosis not present

## 2013-04-28 DIAGNOSIS — R339 Retention of urine, unspecified: Secondary | ICD-10-CM | POA: Diagnosis not present

## 2013-05-04 DIAGNOSIS — R262 Difficulty in walking, not elsewhere classified: Secondary | ICD-10-CM | POA: Diagnosis not present

## 2013-05-04 DIAGNOSIS — M25519 Pain in unspecified shoulder: Secondary | ICD-10-CM | POA: Diagnosis not present

## 2013-05-06 DIAGNOSIS — M25519 Pain in unspecified shoulder: Secondary | ICD-10-CM | POA: Diagnosis not present

## 2013-05-06 DIAGNOSIS — R262 Difficulty in walking, not elsewhere classified: Secondary | ICD-10-CM | POA: Diagnosis not present

## 2013-05-08 ENCOUNTER — Other Ambulatory Visit: Payer: Self-pay | Admitting: Internal Medicine

## 2013-05-08 ENCOUNTER — Ambulatory Visit (INDEPENDENT_AMBULATORY_CARE_PROVIDER_SITE_OTHER): Payer: Medicare Other | Admitting: Internal Medicine

## 2013-05-08 ENCOUNTER — Encounter: Payer: Self-pay | Admitting: Internal Medicine

## 2013-05-08 ENCOUNTER — Encounter: Payer: Medicare Other | Admitting: Internal Medicine

## 2013-05-08 VITALS — BP 134/88 | HR 76 | Temp 97.7°F | Ht 72.0 in | Wt 210.0 lb

## 2013-05-08 DIAGNOSIS — Z0000 Encounter for general adult medical examination without abnormal findings: Secondary | ICD-10-CM

## 2013-05-08 DIAGNOSIS — N183 Chronic kidney disease, stage 3 unspecified: Secondary | ICD-10-CM | POA: Diagnosis not present

## 2013-05-08 DIAGNOSIS — I10 Essential (primary) hypertension: Secondary | ICD-10-CM

## 2013-05-08 DIAGNOSIS — E785 Hyperlipidemia, unspecified: Secondary | ICD-10-CM

## 2013-05-08 MED ORDER — ATORVASTATIN CALCIUM 10 MG PO TABS: 10.0000 mg | ORAL_TABLET | Freq: Every day | ORAL | Status: DC

## 2013-05-08 MED ORDER — ESOMEPRAZOLE MAGNESIUM 40 MG PO CPDR: 40.0000 mg | DELAYED_RELEASE_CAPSULE | Freq: Every day | ORAL | Status: DC

## 2013-05-08 NOTE — Progress Notes (Signed)
Subjective:    Patient ID: Jose Meza, male    DOB: 1931/02/23, 80 y.o.   MRN: [id number]  HPI   Here for medicare wellness  Diet: heart healthy  Physical activity: sedentary Depression/mood screen: negative Hearing: intact to whispered voice Visual acuity: grossly normal, performs annual eye exam  ADLs: capable Fall risk: none Home safety: good Cognitive evaluation: intact to orientation, naming, recall and repetition EOL planning: adv directives, full code/ I agree  I have personally reviewed and have noted 1. The patient's medical and social history 2. Their use of alcohol, tobacco or illicit drugs 3. Their current medications and supplements 4. The patient's functional ability including ADL's, fall risks, home safety risks and hearing or visual impairment. 5. Diet and physical activities 6. Evidence for depression or mood disorders  Also reviewed chronic medical issues and interval medical events  Past Medical History  Diagnosis Date  . GERD (gastroesophageal reflux disease)   . Arthritis     neg rheum eval  . Hypertension   . BPH (benign prostatic hypertrophy)     recurrent prostatitis, hx BOO  . Chronic kidney disease     stage 2-3  . Hyperlipidemia   . Allergic rhinitis, seasonal   . IBS (irritable bowel syndrome)     constipation prone  . DJD (degenerative joint disease) of knee     right knee, s/p knee replacement 12/06  . Cauda equina, syndrome   . Polyp of colon, adenomatous 07/2009    buccini   Family History  Problem Relation Age of Onset  . Colon cancer Mother    History  Substance Use Topics  . Smoking status: Former Smoker -- 2.00 packs/day for 7 years    Quit date: 01/15/1961  . Smokeless tobacco: Never Used  . Alcohol Use: Yes     Comment: Les than 1 per day    Review of Systems  Constitutional: Negative for fever, activity change, appetite change, fatigue and unexpected weight change.  Respiratory: Negative for cough, chest  tightness, shortness of breath and wheezing.   Cardiovascular: Negative for chest pain, palpitations and leg swelling.  Neurological: Negative for dizziness, weakness and headaches.  Psychiatric/Behavioral: Negative for dysphoric mood. The patient is not nervous/anxious.   All other systems reviewed and are negative.      Objective:   Physical Exam  BP 134/88  Pulse 76  Temp(Src) 97.7 F (36.5 C) (Oral)  Ht 6' (1.829 m)  Wt 210 lb (95.255 kg)  BMI 28.47 kg/m2  SpO2 99% Wt Readings from Last 3 Encounters:  05/08/13 210 lb (95.255 kg)  11/30/11 205 lb 6.4 oz (93.169 kg)  05/18/11 202 lb (91.627 kg)   Constitutional: he appears well-developed and well-nourished. No distress.  HENT: Head: Normocephalic and atraumatic. Ears: B TMs ok, no erythema or effusion; Nose: Nose normal. Mouth/Throat: Oropharynx is clear and moist. No oropharyngeal exudate.  Eyes: Conjunctivae and EOM are normal. Pupils are equal, round, and reactive to light. No scleral icterus.  Neck: Normal range of motion. Neck supple. No JVD present. No thyromegaly present.  Cardiovascular: Normal rate, regular rhythm and normal heart sounds.  No murmur heard. No BLE edema. Pulmonary/Chest: Effort normal and breath sounds normal. No respiratory distress. he has no wheezes.  Abdominal: Soft. Bowel sounds are normal. he exhibits no distension. There is no tenderness. no masses Musculoskeletal: Normal range of motion, no joint effusions. No gross deformities Neurological: he is alert and oriented to person, place, and time. No cranial  nerve deficit. Coordination, balance, strength, speech and gait are normal.  Skin: Skin is warm and dry. No rash noted. No erythema.  Psychiatric: he has a normal mood and affect. behavior is normal. Judgment and thought content normal.  Lab Results  Component Value Date   WBC 9.7 09/01/2010   HGB 15.7 04/03/2013   HCT 45 04/03/2013   PLT 190 04/03/2013   CHOL 158 04/03/2013   TRIG 153  04/03/2013   HDL 39 04/03/2013   LDLCALC 88 04/03/2013   ALT 31 04/03/2013   AST 22 04/03/2013   NA 140 04/03/2013   K 4.7 04/03/2013   CREATININE 1.5* 04/03/2013   BUN 24* 04/03/2013   TSH 3.70 11/30/2010    Ct Hip Left Wo Contrast  04/22/2013   CLINICAL DATA:  [place].  Left hip pain.  EXAM: CT OF THE LEFT HIP WITHOUT CONTRAST  TECHNIQUE: Multidetector CT imaging was performed according to the standard protocol. Multiplanar CT image reconstructions were also generated.  COMPARISON:  None.  FINDINGS: There are moderate left hip joint degenerative changes with joint space narrowing, osteophytic spurring and subchondral cystic change. No acute hip fracture is identified. No CT findings for avascular necrosis.  The visualized portion of the left pelvis is intact. The left hip musculature is grossly normal. The bladder appears distended and the prostate gland is enlarged.  IMPRESSION: Moderate degenerative changes involving the left hip but no acute fracture.   Electronically Signed   By: Kalman Jewels M.D.   On: 04/22/2013 18:54   ECG: sinus at 80 beats per minute. Occasional PAC. Incomplete right bundle branch block    Assessment & Plan:   AWV/v70.0 - Today patient counseled on age appropriate routine health concerns for screening and prevention, each reviewed and up to date or declined. Immunizations reviewed and up to date or declined. Labs/ECG reviewed. Risk factors for depression reviewed and negative. Hearing function and visual acuity are intact. ADLs screened and addressed as needed. Functional ability and level of safety reviewed and appropriate. Education, counseling and referrals performed based on assessed risks today. Patient provided with a copy of personalized plan for preventive services.  Problem List Items Addressed This Visit   CKD (chronic kidney disease) stage 3, GFR 30-59 ml/min      Again reviewed diagnosis and implications/reassurance Stable over last 6+ years of record review  (starting 2009) Continue avoiding NSAIDs -   Lab Results  Component Value Date   CREATININE 1.5* 04/03/2013      Hyperlipidemia     On statin - last lipids reviewed - checks each spring with rheumatology     Relevant Medications      atorvastatin (LIPITOR) tablet    Other Visit Diagnoses   Routine general medical examination at a health care facility    -  Primary    Unspecified essential hypertension        Relevant Medications       atorvastatin (LIPITOR) tablet    Other Relevant Orders       EKG 12-Lead (Completed)

## 2013-05-08 NOTE — Assessment & Plan Note (Signed)
BP Readings from Last 3 Encounters:  05/08/13 134/88  04/09/13 148/92  11/30/11 120/72   The current medical regimen is effective;  continue present plan and medications.

## 2013-05-08 NOTE — Assessment & Plan Note (Signed)
On statin - last lipids reviewed - checks each spring with rheumatology

## 2013-05-08 NOTE — Patient Instructions (Addendum)
It was good to see you today.  We have reviewed your prior records including labs and tests today  Health Maintenance reviewed - all recommended immunizations and age-appropriate screenings are up-to-date.  Medications reviewed and updated, no changes recommended at this time.  Please schedule followup in 12 months for annual exam and labs, call sooner if problems.  Health Maintenance, Males A healthy lifestyle and preventative care can promote health and wellness.  Maintain regular health, dental, and eye exams.  Eat a healthy diet. Foods like vegetables, fruits, whole grains, low-fat dairy products, and lean protein foods contain the nutrients you need and are low in calories. Decrease your intake of foods high in solid fats, added sugars, and salt. Get information about a proper diet from your health care provider, if necessary.  Regular physical exercise is one of the most important things you can do for your health. Most adults should get at least 150 minutes of moderate-intensity exercise (any activity that increases your heart rate and causes you to sweat) each week. In addition, most adults need muscle-strengthening exercises on 2 or more days a week.   Maintain a healthy weight. The body mass index (BMI) is a screening tool to identify possible weight problems. It provides an estimate of body fat based on height and weight. Your health care provider can find your BMI and can help you achieve or maintain a healthy weight. For males 20 years and older:  A BMI below 18.5 is considered underweight.  A BMI of 18.5 to 24.9 is normal.  A BMI of 25 to 29.9 is considered overweight.  A BMI of 30 and above is considered obese.  Maintain normal blood lipids and cholesterol by exercising and minimizing your intake of saturated fat. Eat a balanced diet with plenty of fruits and vegetables. Blood tests for lipids and cholesterol should begin at age 32 and be repeated every 5 years. If your  lipid or cholesterol levels are high, you are over 50, or you are at high risk for heart disease, you may need your cholesterol levels checked more frequently.Ongoing high lipid and cholesterol levels should be treated with medicines, if diet and exercise are not working.  If you smoke, find out from your health care provider how to quit. If you do not use tobacco, do not start.  Lung cancer screening is recommended for adults aged 32 80 years who are at high risk for developing lung cancer because of a history of smoking. A yearly low-dose CT scan of the lungs is recommended for people who have at least a 30-pack-year history of smoking and are a current smoker or have quit within the past 15 years. A pack year of smoking is smoking an average of 1 pack of cigarettes a day for 1 year (for example, a 30-pack-year history of smoking could mean smoking 1 pack a day for 30 years or 2 packs a day for 15 years). Yearly screening should continue until the smoker has stopped smoking for at least 15 years. Yearly screening should be stopped for people who develop a health problem that would prevent them from having lung cancer treatment.  If you choose to drink alcohol, do not have more than 2 drinks per day. One drink is considered to be 12 oz (360 mL) of beer, 5 oz (150 mL) of wine, or 1.5 oz (45 mL) of liquor.  Avoid use of street drugs. Do not share needles with anyone. Ask for help if you need support  or instructions about stopping the use of drugs.  High blood pressure causes heart disease and increases the risk of stroke. Blood pressure should be checked at least every 1 2 years. Ongoing high blood pressure should be treated with medicines if weight loss and exercise are not effective.  If you are 25 79 years old, ask your health care provider if you should take aspirin to prevent heart disease.  Diabetes screening involves taking a blood sample to check your fasting blood sugar level. This should be  done once every 3 years after age 18, if you are at a normal weight and without risk factors for diabetes. Testing should be considered at a younger age or be carried out more frequently if you are overweight and have at least 1 risk factor for diabetes.  Colorectal cancer can be detected and often prevented. Most routine colorectal cancer screening begins at the age of 41 and continues through age 1. However, your health care provider may recommend screening at an earlier age if you have risk factors for colon cancer. On a yearly basis, your health care provider may provide home test kits to check for hidden blood in the stool. A small camera at the end of a tube may be used to directly examine the colon (sigmoidoscopy or colonoscopy) to detect the earliest forms of colorectal cancer. Talk to your health care provider about this at age 46, when routine screening begins. A direct exam of the colon should be repeated every 5 10 years through age 24, unless early forms of pre-cancerous polyps or small growths are found.  People who are at an increased risk for hepatitis B should be screened for this virus. You are considered at high risk for hepatitis B if:  You were born in a country where hepatitis B occurs often. Talk with your health care provider about which countries are considered high-risk.  Your parents were born in a high-risk country and you have not received a shot to protect against hepatitis B (hepatitis B vaccine).  You have HIV or AIDS.  You use needles to inject street drugs.  You live with, or have sex with, someone who has hepatitis B.  You are a man who has sex with other men (MSM).  You get hemodialysis treatment.  You take certain medicines for conditions like cancer, organ transplantation, and autoimmune conditions.  Hepatitis C blood testing is recommended for all people born from [birth year] through 1963 and any individual with known risk factors for hepatitis C.  Healthy men  should no longer receive prostate-specific antigen (PSA) blood tests as part of routine cancer screening. Talk to your health care provider about prostate cancer screening.  Testicular cancer screening is not recommended for adolescents or adult males who have no symptoms. Screening includes self-exam, a health care provider exam, and other screening tests. Consult with your health care provider about any symptoms you have or any concerns you have about testicular cancer.  Practice safe sex. Use condoms and avoid high-risk sexual practices to reduce the spread of sexually transmitted infections (STIs).  Use sunscreen. Apply sunscreen liberally and repeatedly throughout the day. You should seek shade when your shadow is shorter than you. Protect yourself by wearing long sleeves, pants, a wide-brimmed hat, and sunglasses year round, whenever you are outdoors.  Tell your health care provider of new moles or changes in moles, especially if there is a change in shape or color. Also tell your provider if a mole is larger  than the size of a pencil eraser.  A one-time screening for abdominal aortic aneurysm (AAA) and surgical repair of large AAAs by ultrasound is recommended for men aged 43 75 years who are current or former smokers.  Stay current with your vaccines (immunizations). Document Released: 06/30/2007 Document Revised: 10/22/2012 Document Reviewed: 05/29/2010 [facility] Patient Information 2014 [place], [place].

## 2013-05-08 NOTE — Assessment & Plan Note (Signed)
Again reviewed diagnosis and implications/reassurance Stable over last 6+ years of record review (starting 2009) Continue avoiding NSAIDs -   Lab Results  Component Value Date   CREATININE 1.5* 04/03/2013

## 2013-05-08 NOTE — Progress Notes (Signed)
Pre visit review using our clinic review tool, if applicable. No additional management support is needed unless otherwise documented below in the visit note. 

## 2013-05-11 ENCOUNTER — Other Ambulatory Visit: Payer: Self-pay | Admitting: *Deleted

## 2013-05-11 MED ORDER — ATORVASTATIN CALCIUM 10 MG PO TABS: 10.0000 mg | ORAL_TABLET | Freq: Every day | ORAL | Status: DC

## 2013-05-11 MED ORDER — METOPROLOL SUCCINATE ER 100 MG PO TB24: 100.0000 mg | ORAL_TABLET | Freq: Every day | ORAL | Status: DC

## 2013-05-13 DIAGNOSIS — R262 Difficulty in walking, not elsewhere classified: Secondary | ICD-10-CM | POA: Diagnosis not present

## 2013-05-13 DIAGNOSIS — M25519 Pain in unspecified shoulder: Secondary | ICD-10-CM | POA: Diagnosis not present

## 2013-05-18 DIAGNOSIS — M25519 Pain in unspecified shoulder: Secondary | ICD-10-CM | POA: Diagnosis not present

## 2013-05-18 DIAGNOSIS — R262 Difficulty in walking, not elsewhere classified: Secondary | ICD-10-CM | POA: Diagnosis not present

## 2013-05-20 ENCOUNTER — Telehealth: Payer: Self-pay | Admitting: Internal Medicine

## 2013-05-20 DIAGNOSIS — G4700 Insomnia, unspecified: Secondary | ICD-10-CM

## 2013-05-20 DIAGNOSIS — M25519 Pain in unspecified shoulder: Secondary | ICD-10-CM | POA: Diagnosis not present

## 2013-05-20 MED ORDER — AMITRIPTYLINE HCL 10 MG PO TABS: 10.0000 mg | ORAL_TABLET | Freq: Every day | ORAL | Status: AC

## 2013-05-20 MED ORDER — ZOLPIDEM TARTRATE 5 MG PO TABS: 5.0000 mg | ORAL_TABLET | Freq: Every evening | ORAL | Status: DC | PRN

## 2013-05-20 NOTE — Telephone Encounter (Signed)
Patient states that he needs a new rx for zolpidem (AMBIEN)10 MG sent to [facility]. Please advise.

## 2013-05-20 NOTE — Telephone Encounter (Signed)
Notified pt rx fax to mail service...Jose Meza

## 2013-05-20 NOTE — Telephone Encounter (Signed)
ok 

## 2013-05-26 ENCOUNTER — Telehealth: Payer: Self-pay | Admitting: Internal Medicine

## 2013-05-26 NOTE — Telephone Encounter (Signed)
Amitriptyline Hcl Tablet PA form received. Placed on Lucy's desk to be completed and md sig.

## 2013-05-27 ENCOUNTER — Telehealth: Payer: Self-pay

## 2013-05-27 NOTE — Telephone Encounter (Signed)
The patient called and stated he needs a 10mg  tab called in.  He stated a 5 mg tab was called in by mistake.   ptcallback - [id number]

## 2013-05-29 NOTE — Telephone Encounter (Signed)
Since zolpidem 5 has already been mailed & sent to pt at next renewal we will print out 10 mg which he suppose to be taking...Jose Meza

## 2013-06-05 DIAGNOSIS — R262 Difficulty in walking, not elsewhere classified: Secondary | ICD-10-CM | POA: Diagnosis not present

## 2013-06-05 DIAGNOSIS — M25519 Pain in unspecified shoulder: Secondary | ICD-10-CM | POA: Diagnosis not present

## 2013-06-11 DIAGNOSIS — R262 Difficulty in walking, not elsewhere classified: Secondary | ICD-10-CM | POA: Diagnosis not present

## 2013-06-11 DIAGNOSIS — M25519 Pain in unspecified shoulder: Secondary | ICD-10-CM | POA: Diagnosis not present

## 2013-06-12 DIAGNOSIS — M25519 Pain in unspecified shoulder: Secondary | ICD-10-CM | POA: Diagnosis not present

## 2013-06-15 DIAGNOSIS — R262 Difficulty in walking, not elsewhere classified: Secondary | ICD-10-CM | POA: Diagnosis not present

## 2013-06-15 DIAGNOSIS — M25519 Pain in unspecified shoulder: Secondary | ICD-10-CM | POA: Diagnosis not present

## 2013-06-17 DIAGNOSIS — M25519 Pain in unspecified shoulder: Secondary | ICD-10-CM | POA: Diagnosis not present

## 2013-07-01 DIAGNOSIS — Z79899 Other long term (current) drug therapy: Secondary | ICD-10-CM | POA: Diagnosis not present

## 2013-07-04 ENCOUNTER — Other Ambulatory Visit: Payer: Self-pay | Admitting: Internal Medicine

## 2013-07-27 ENCOUNTER — Other Ambulatory Visit: Payer: Self-pay | Admitting: Internal Medicine

## 2013-07-27 MED ORDER — MEPROBAMATE 200 MG PO TABS: 400.0000 mg | ORAL_TABLET | Freq: Every evening | ORAL | Status: DC | PRN

## 2013-07-27 NOTE — Telephone Encounter (Signed)
Will do Please print so i may sign thanks

## 2013-07-27 NOTE — Telephone Encounter (Signed)
This has been printed. Will call pt and inform.

## 2013-07-27 NOTE — Telephone Encounter (Signed)
Pt request refill for meprobamete 200 mg [facility] for [facility] days supply. Please advise, please call pt if this is ok

## 2013-09-16 DIAGNOSIS — M069 Rheumatoid arthritis, unspecified: Secondary | ICD-10-CM | POA: Diagnosis not present

## 2013-09-16 DIAGNOSIS — M542 Cervicalgia: Secondary | ICD-10-CM | POA: Diagnosis not present

## 2013-09-16 DIAGNOSIS — Z09 Encounter for follow-up examination after completed treatment for conditions other than malignant neoplasm: Secondary | ICD-10-CM | POA: Diagnosis not present

## 2013-09-16 DIAGNOSIS — M545 Low back pain, unspecified: Secondary | ICD-10-CM | POA: Diagnosis not present

## 2013-10-21 DIAGNOSIS — Z23 Encounter for immunization: Secondary | ICD-10-CM | POA: Diagnosis not present

## 2013-10-22 ENCOUNTER — Other Ambulatory Visit: Payer: Self-pay | Admitting: Internal Medicine

## 2013-10-22 MED ORDER — ZOLPIDEM TARTRATE 5 MG PO TABS: 5.0000 mg | ORAL_TABLET | Freq: Every evening | ORAL | Status: DC | PRN

## 2013-10-22 NOTE — Telephone Encounter (Signed)
Pt called in and is requesting a 90 day supply of zolpidem (AMBIEN) 5 MG tablet [[id number]] [facility].  He said that he is completely out.    Thank you

## 2013-10-30 ENCOUNTER — Other Ambulatory Visit: Payer: Self-pay

## 2013-11-12 ENCOUNTER — Telehealth: Payer: Self-pay | Admitting: Internal Medicine

## 2013-11-12 MED ORDER — MEPROBAMATE 200 MG PO TABS: 400.0000 mg | ORAL_TABLET | Freq: Every evening | ORAL | Status: DC | PRN

## 2013-11-12 NOTE — Telephone Encounter (Signed)
Pt requesting Meprobanate refill. Pt states he has called [facility] and they advised him to call office. Please advise. [phone]

## 2013-11-12 NOTE — Telephone Encounter (Signed)
MD out of office. Pls advise...Johny Chess

## 2013-11-16 ENCOUNTER — Other Ambulatory Visit: Payer: Self-pay | Admitting: Internal Medicine

## 2013-11-16 MED ORDER — MEPROBAMATE 200 MG PO TABS: 400.0000 mg | ORAL_TABLET | Freq: Every evening | ORAL | Status: DC | PRN

## 2013-11-16 NOTE — Telephone Encounter (Signed)
meprobamate (EQUANIL) 200 MG tablet, Please send to [facility] and not [facility]. [facility] and [facility] and needs sent to [facility]. Pls let pt now when sent.

## 2013-11-16 NOTE — Telephone Encounter (Signed)
Ok to print and i will sign to fax to [facility] thanks

## 2013-11-17 ENCOUNTER — Other Ambulatory Visit: Payer: Self-pay

## 2013-11-17 NOTE — Telephone Encounter (Signed)
MD sign script [facility]. Pt has been notified...Jose Meza

## 2014-01-06 ENCOUNTER — Ambulatory Visit (INDEPENDENT_AMBULATORY_CARE_PROVIDER_SITE_OTHER): Payer: Medicare Other | Admitting: *Deleted

## 2014-01-06 DIAGNOSIS — Z23 Encounter for immunization: Secondary | ICD-10-CM

## 2014-01-11 DIAGNOSIS — Z79899 Other long term (current) drug therapy: Secondary | ICD-10-CM | POA: Diagnosis not present

## 2014-02-10 DIAGNOSIS — Z79899 Other long term (current) drug therapy: Secondary | ICD-10-CM | POA: Diagnosis not present

## 2014-02-10 LAB — HEPATIC FUNCTION PANEL
ALK PHOS: 88 U/L (ref 25–125)
ALT: 24 U/L (ref 10–40)
AST: 19 U/L (ref 14–40)
Bilirubin, Total: 0.4 mg/dL

## 2014-02-10 LAB — BASIC METABOLIC PANEL
BUN: 27 mg/dL — AB (ref 4–21)
Creatinine: 1.3 mg/dL (ref 0.6–1.3)
Glucose: 100 mg/dL
POTASSIUM: 3.8 mmol/L (ref 3.4–5.3)
SODIUM: 141 mmol/L (ref 137–147)

## 2014-02-10 LAB — CBC AND DIFFERENTIAL
HEMATOCRIT: 44 % (ref 41–53)
Hemoglobin: 15 g/dL (ref 13.5–17.5)
PLATELETS: 166 10*3/uL (ref 150–399)
WBC: 7.5 10*3/mL

## 2014-02-10 LAB — HEMOGLOBIN A1C: HEMOGLOBIN A1C: 15 % — AB (ref 4.0–6.0)

## 2014-02-17 ENCOUNTER — Encounter: Payer: Self-pay | Admitting: Internal Medicine

## 2014-02-17 DIAGNOSIS — M5030 Other cervical disc degeneration, unspecified cervical region: Secondary | ICD-10-CM | POA: Diagnosis not present

## 2014-02-17 DIAGNOSIS — M170 Bilateral primary osteoarthritis of knee: Secondary | ICD-10-CM | POA: Diagnosis not present

## 2014-02-17 DIAGNOSIS — M069 Rheumatoid arthritis, unspecified: Secondary | ICD-10-CM | POA: Diagnosis not present

## 2014-02-17 DIAGNOSIS — M19041 Primary osteoarthritis, right hand: Secondary | ICD-10-CM | POA: Diagnosis not present

## 2014-03-21 ENCOUNTER — Other Ambulatory Visit: Payer: Self-pay | Admitting: Internal Medicine

## 2014-04-19 DIAGNOSIS — H1013 Acute atopic conjunctivitis, bilateral: Secondary | ICD-10-CM | POA: Diagnosis not present

## 2014-04-30 DIAGNOSIS — J300 Vasomotor rhinitis: Secondary | ICD-10-CM | POA: Diagnosis not present

## 2014-04-30 DIAGNOSIS — N183 Chronic kidney disease, stage 3 (moderate): Secondary | ICD-10-CM | POA: Diagnosis not present

## 2014-04-30 DIAGNOSIS — Z23 Encounter for immunization: Secondary | ICD-10-CM | POA: Diagnosis not present

## 2014-04-30 DIAGNOSIS — E780 Pure hypercholesterolemia: Secondary | ICD-10-CM | POA: Diagnosis not present

## 2014-04-30 DIAGNOSIS — K219 Gastro-esophageal reflux disease without esophagitis: Secondary | ICD-10-CM | POA: Diagnosis not present

## 2014-04-30 DIAGNOSIS — I129 Hypertensive chronic kidney disease with stage 1 through stage 4 chronic kidney disease, or unspecified chronic kidney disease: Secondary | ICD-10-CM | POA: Diagnosis not present

## 2014-06-02 ENCOUNTER — Telehealth: Payer: Self-pay | Admitting: Internal Medicine

## 2014-06-02 MED ORDER — MEPROBAMATE 200 MG PO TABS: 400.0000 mg | ORAL_TABLET | Freq: Every evening | ORAL | Status: AC | PRN

## 2014-06-02 NOTE — Telephone Encounter (Signed)
Pt request refill for meprobamate (EQUANIL) 200 MG tablet for 90 day supply to be [facility]. Please call pt once its done

## 2014-06-02 NOTE — Telephone Encounter (Signed)
rx printed for md to sign 

## 2014-06-04 ENCOUNTER — Other Ambulatory Visit: Payer: Self-pay | Admitting: Internal Medicine

## 2014-06-04 ENCOUNTER — Telehealth: Payer: Self-pay | Admitting: Internal Medicine

## 2014-06-04 NOTE — Telephone Encounter (Signed)
Is requesting meprobamate to be [facility].  [facility] did not get last script.

## 2014-06-07 NOTE — Telephone Encounter (Signed)
It is in MD yellow folder to sign and will be faxed today.

## 2014-06-08 NOTE — Telephone Encounter (Signed)
LM with pt that it has been faxed. rx faxed.

## 2014-06-16 DIAGNOSIS — Z79899 Other long term (current) drug therapy: Secondary | ICD-10-CM | POA: Diagnosis not present

## 2014-06-19 ENCOUNTER — Other Ambulatory Visit: Payer: Self-pay | Admitting: Internal Medicine

## 2014-06-30 ENCOUNTER — Other Ambulatory Visit: Payer: Self-pay

## 2014-06-30 MED ORDER — ZOLPIDEM TARTRATE 5 MG PO TABS: 5.0000 mg | ORAL_TABLET | Freq: Every evening | ORAL | Status: DC | PRN

## 2014-06-30 NOTE — Telephone Encounter (Signed)
Request for refill of zolpidem 5 mg. Is this okay to refill.   [facility]

## 2014-07-05 DIAGNOSIS — N419 Inflammatory disease of prostate, unspecified: Secondary | ICD-10-CM | POA: Diagnosis not present

## 2014-07-05 DIAGNOSIS — N390 Urinary tract infection, site not specified: Secondary | ICD-10-CM | POA: Diagnosis not present

## 2014-07-08 ENCOUNTER — Telehealth: Payer: Self-pay

## 2014-07-08 MED ORDER — ZOLPIDEM TARTRATE 5 MG PO TABS: 5.0000 mg | ORAL_TABLET | Freq: Every evening | ORAL | Status: AC | PRN

## 2014-07-08 NOTE — Telephone Encounter (Signed)
[facility] request to refill zolpidem. Printed for PCP to sign on Monday.

## 2014-07-21 DIAGNOSIS — M170 Bilateral primary osteoarthritis of knee: Secondary | ICD-10-CM | POA: Diagnosis not present

## 2014-07-21 DIAGNOSIS — Z09 Encounter for follow-up examination after completed treatment for conditions other than malignant neoplasm: Secondary | ICD-10-CM | POA: Diagnosis not present

## 2014-07-21 DIAGNOSIS — M19041 Primary osteoarthritis, right hand: Secondary | ICD-10-CM | POA: Diagnosis not present

## 2014-07-21 DIAGNOSIS — M069 Rheumatoid arthritis, unspecified: Secondary | ICD-10-CM | POA: Diagnosis not present

## 2014-09-17 DIAGNOSIS — Z13220 Encounter for screening for lipoid disorders: Secondary | ICD-10-CM | POA: Diagnosis not present

## 2014-09-17 DIAGNOSIS — Z79899 Other long term (current) drug therapy: Secondary | ICD-10-CM | POA: Diagnosis not present

## 2014-10-20 DIAGNOSIS — N183 Chronic kidney disease, stage 3 (moderate): Secondary | ICD-10-CM | POA: Diagnosis not present

## 2014-10-20 DIAGNOSIS — M069 Rheumatoid arthritis, unspecified: Secondary | ICD-10-CM | POA: Diagnosis not present

## 2014-10-20 DIAGNOSIS — I129 Hypertensive chronic kidney disease with stage 1 through stage 4 chronic kidney disease, or unspecified chronic kidney disease: Secondary | ICD-10-CM | POA: Diagnosis not present

## 2014-10-20 DIAGNOSIS — Z0000 Encounter for general adult medical examination without abnormal findings: Secondary | ICD-10-CM | POA: Diagnosis not present

## 2014-10-20 DIAGNOSIS — Z23 Encounter for immunization: Secondary | ICD-10-CM | POA: Diagnosis not present

## 2014-10-20 DIAGNOSIS — K219 Gastro-esophageal reflux disease without esophagitis: Secondary | ICD-10-CM | POA: Diagnosis not present

## 2014-10-20 DIAGNOSIS — Z1389 Encounter for screening for other disorder: Secondary | ICD-10-CM | POA: Diagnosis not present

## 2014-10-20 DIAGNOSIS — E7800 Pure hypercholesterolemia, unspecified: Secondary | ICD-10-CM | POA: Diagnosis not present

## 2014-12-17 DIAGNOSIS — Z79899 Other long term (current) drug therapy: Secondary | ICD-10-CM | POA: Diagnosis not present

## 2014-12-30 DIAGNOSIS — M79641 Pain in right hand: Secondary | ICD-10-CM | POA: Diagnosis not present

## 2014-12-30 DIAGNOSIS — R748 Abnormal levels of other serum enzymes: Secondary | ICD-10-CM | POA: Diagnosis not present

## 2014-12-30 DIAGNOSIS — M4696 Unspecified inflammatory spondylopathy, lumbar region: Secondary | ICD-10-CM | POA: Diagnosis not present

## 2014-12-30 DIAGNOSIS — M79672 Pain in left foot: Secondary | ICD-10-CM | POA: Diagnosis not present

## 2014-12-30 DIAGNOSIS — N182 Chronic kidney disease, stage 2 (mild): Secondary | ICD-10-CM | POA: Diagnosis not present

## 2014-12-30 DIAGNOSIS — M0579 Rheumatoid arthritis with rheumatoid factor of multiple sites without organ or systems involvement: Secondary | ICD-10-CM | POA: Diagnosis not present

## 2014-12-30 DIAGNOSIS — M79671 Pain in right foot: Secondary | ICD-10-CM | POA: Diagnosis not present

## 2014-12-30 DIAGNOSIS — M79642 Pain in left hand: Secondary | ICD-10-CM | POA: Diagnosis not present

## 2015-01-14 DIAGNOSIS — N400 Enlarged prostate without lower urinary tract symptoms: Secondary | ICD-10-CM | POA: Diagnosis not present

## 2015-01-26 DIAGNOSIS — N400 Enlarged prostate without lower urinary tract symptoms: Secondary | ICD-10-CM | POA: Diagnosis not present

## 2015-01-26 DIAGNOSIS — R278 Other lack of coordination: Secondary | ICD-10-CM | POA: Diagnosis not present

## 2015-01-26 DIAGNOSIS — M6281 Muscle weakness (generalized): Secondary | ICD-10-CM | POA: Diagnosis not present

## 2015-02-09 DIAGNOSIS — M6281 Muscle weakness (generalized): Secondary | ICD-10-CM | POA: Diagnosis not present

## 2015-02-09 DIAGNOSIS — M4800 Spinal stenosis, site unspecified: Secondary | ICD-10-CM | POA: Diagnosis not present

## 2015-02-09 DIAGNOSIS — R293 Abnormal posture: Secondary | ICD-10-CM | POA: Diagnosis not present

## 2015-02-09 DIAGNOSIS — R278 Other lack of coordination: Secondary | ICD-10-CM | POA: Diagnosis not present

## 2015-02-18 ENCOUNTER — Other Ambulatory Visit: Payer: Self-pay | Admitting: Nephrology

## 2015-02-18 DIAGNOSIS — I129 Hypertensive chronic kidney disease with stage 1 through stage 4 chronic kidney disease, or unspecified chronic kidney disease: Secondary | ICD-10-CM | POA: Diagnosis not present

## 2015-02-18 DIAGNOSIS — N183 Chronic kidney disease, stage 3 (moderate): Secondary | ICD-10-CM | POA: Diagnosis not present

## 2015-02-18 DIAGNOSIS — N1832 Chronic kidney disease, stage 3b: Secondary | ICD-10-CM

## 2015-02-24 ENCOUNTER — Other Ambulatory Visit: Payer: Medicare Other

## 2015-02-25 ENCOUNTER — Ambulatory Visit
Admission: RE | Admit: 2015-02-25 | Discharge: 2015-02-25 | Disposition: A | Discharge status: Home / Self Care | Payer: Medicare Other | Source: Ambulatory Visit | Attending: Nephrology | Admitting: Nephrology

## 2015-02-25 DIAGNOSIS — N183 Chronic kidney disease, stage 3 (moderate): Principal | ICD-10-CM

## 2015-02-25 DIAGNOSIS — N281 Cyst of kidney, acquired: Secondary | ICD-10-CM | POA: Diagnosis not present

## 2015-02-25 DIAGNOSIS — N1832 Chronic kidney disease, stage 3b: Secondary | ICD-10-CM

## 2015-03-04 DIAGNOSIS — M4800 Spinal stenosis, site unspecified: Secondary | ICD-10-CM | POA: Diagnosis not present

## 2015-03-04 DIAGNOSIS — R293 Abnormal posture: Secondary | ICD-10-CM | POA: Diagnosis not present

## 2015-03-04 DIAGNOSIS — R278 Other lack of coordination: Secondary | ICD-10-CM | POA: Diagnosis not present

## 2015-03-04 DIAGNOSIS — M6281 Muscle weakness (generalized): Secondary | ICD-10-CM | POA: Diagnosis not present

## 2015-05-04 DIAGNOSIS — N183 Chronic kidney disease, stage 3 (moderate): Secondary | ICD-10-CM | POA: Diagnosis not present

## 2015-05-04 DIAGNOSIS — I129 Hypertensive chronic kidney disease with stage 1 through stage 4 chronic kidney disease, or unspecified chronic kidney disease: Secondary | ICD-10-CM | POA: Diagnosis not present

## 2015-05-06 DIAGNOSIS — H26493 Other secondary cataract, bilateral: Secondary | ICD-10-CM | POA: Diagnosis not present

## 2015-05-18 DIAGNOSIS — Z79899 Other long term (current) drug therapy: Secondary | ICD-10-CM | POA: Diagnosis not present

## 2015-05-31 ENCOUNTER — Other Ambulatory Visit: Payer: Self-pay | Admitting: Internal Medicine

## 2015-07-27 ENCOUNTER — Other Ambulatory Visit: Payer: Self-pay | Admitting: Internal Medicine

## 2015-09-05 DIAGNOSIS — Z09 Encounter for follow-up examination after completed treatment for conditions other than malignant neoplasm: Secondary | ICD-10-CM | POA: Diagnosis not present

## 2015-09-05 DIAGNOSIS — M0579 Rheumatoid arthritis with rheumatoid factor of multiple sites without organ or systems involvement: Secondary | ICD-10-CM | POA: Diagnosis not present

## 2015-09-05 DIAGNOSIS — M19271 Secondary osteoarthritis, right ankle and foot: Secondary | ICD-10-CM | POA: Diagnosis not present

## 2015-09-05 DIAGNOSIS — Z79899 Other long term (current) drug therapy: Secondary | ICD-10-CM | POA: Diagnosis not present

## 2015-09-05 DIAGNOSIS — M19241 Secondary osteoarthritis, right hand: Secondary | ICD-10-CM | POA: Diagnosis not present

## 2015-09-29 DIAGNOSIS — Z23 Encounter for immunization: Secondary | ICD-10-CM | POA: Diagnosis not present

## 2015-10-27 DIAGNOSIS — Z79899 Other long term (current) drug therapy: Secondary | ICD-10-CM | POA: Diagnosis not present

## 2015-10-27 DIAGNOSIS — N183 Chronic kidney disease, stage 3 (moderate): Secondary | ICD-10-CM | POA: Diagnosis not present

## 2015-10-27 DIAGNOSIS — E7800 Pure hypercholesterolemia, unspecified: Secondary | ICD-10-CM | POA: Diagnosis not present

## 2015-10-27 DIAGNOSIS — Z0000 Encounter for general adult medical examination without abnormal findings: Secondary | ICD-10-CM | POA: Diagnosis not present

## 2015-10-27 DIAGNOSIS — Z1389 Encounter for screening for other disorder: Secondary | ICD-10-CM | POA: Diagnosis not present

## 2015-10-27 DIAGNOSIS — I129 Hypertensive chronic kidney disease with stage 1 through stage 4 chronic kidney disease, or unspecified chronic kidney disease: Secondary | ICD-10-CM | POA: Diagnosis not present

## 2015-11-28 ENCOUNTER — Telehealth: Payer: Self-pay | Admitting: Rheumatology

## 2015-11-28 ENCOUNTER — Other Ambulatory Visit: Payer: Self-pay | Admitting: Rheumatology

## 2015-11-28 DIAGNOSIS — Z9225 Personal history of immunosupression therapy: Secondary | ICD-10-CM

## 2015-11-28 DIAGNOSIS — Z79899 Other long term (current) drug therapy: Secondary | ICD-10-CM

## 2015-11-28 NOTE — Telephone Encounter (Signed)
Last visit 09/05/15 Next visit 02/01/16 Labs due 09/05/15 (Mr Jose Meza wanted repeat 31m.) TB neg 12/17/14 Will have him get this as well, since it is due soon. He states he will come in tomorrow Ok to refill per Dr Estanislado Pandy

## 2015-11-28 NOTE — Telephone Encounter (Signed)
Patient would like to know when his fasting lab work is due. If patient does not answer, he asked that the information be left on his answering machine.

## 2015-11-28 NOTE — Telephone Encounter (Signed)
His labs are due, he does not need to fast, I have already called him about this, when I got refill request.

## 2015-11-29 ENCOUNTER — Other Ambulatory Visit: Payer: Self-pay | Admitting: Radiology

## 2015-11-29 DIAGNOSIS — Z9225 Personal history of immunosupression therapy: Secondary | ICD-10-CM

## 2015-11-29 DIAGNOSIS — Z79899 Other long term (current) drug therapy: Secondary | ICD-10-CM | POA: Diagnosis not present

## 2015-11-29 LAB — CBC WITH DIFFERENTIAL/PLATELET
BASOS PCT: 0 %
Basophils Absolute: 0 cells/uL (ref 0–200)
EOS ABS: 112 {cells}/uL (ref 15–500)
Eosinophils Relative: 2 %
HEMATOCRIT: 42.8 % (ref 38.5–50.0)
HEMOGLOBIN: 14.4 g/dL (ref 13.2–17.1)
LYMPHS ABS: 1064 {cells}/uL (ref 850–3900)
Lymphocytes Relative: 19 %
MCH: 32.9 pg (ref 27.0–33.0)
MCHC: 33.6 g/dL (ref 32.0–36.0)
MCV: 97.7 fL (ref 80.0–100.0)
MONO ABS: 728 {cells}/uL (ref 200–950)
MPV: 10.1 fL (ref 7.5–12.5)
Monocytes Relative: 13 %
Neutro Abs: 3696 cells/uL (ref 1500–7800)
Neutrophils Relative %: 66 %
Platelets: 123 10*3/uL — ABNORMAL LOW (ref 140–400)
RBC: 4.38 MIL/uL (ref 4.20–5.80)
RDW: 14.4 % (ref 11.0–15.0)
WBC: 5.6 10*3/uL (ref 3.8–10.8)

## 2015-11-29 LAB — COMPLETE METABOLIC PANEL WITH GFR
ALBUMIN: 4 g/dL (ref 3.6–5.1)
ALK PHOS: 60 U/L (ref 40–115)
ALT: 49 U/L — AB (ref 9–46)
AST: 31 U/L (ref 10–35)
BILIRUBIN TOTAL: 0.4 mg/dL (ref 0.2–1.2)
BUN: 25 mg/dL (ref 7–25)
CALCIUM: 9 mg/dL (ref 8.6–10.3)
CHLORIDE: 106 mmol/L (ref 98–110)
CO2: 27 mmol/L (ref 20–31)
CREATININE: 1.58 mg/dL — AB (ref 0.70–1.11)
GFR, EST AFRICAN AMERICAN: 46 mL/min — AB (ref >=60)
GFR, Est Non African American: 40 mL/min — ABNORMAL LOW (ref >=60)
Glucose, Bld: 57 mg/dL — ABNORMAL LOW (ref 65–99)
Potassium: 3.9 mmol/L (ref 3.5–5.3)
Sodium: 140 mmol/L (ref 135–146)
TOTAL PROTEIN: 6.5 g/dL (ref 6.1–8.1)

## 2015-12-01 ENCOUNTER — Telehealth: Payer: Self-pay | Admitting: Radiology

## 2015-12-01 LAB — QUANTIFERON TB GOLD ASSAY (BLOOD)
INTERFERON GAMMA RELEASE ASSAY: NEGATIVE
Mitogen-Nil: 7.49 IU/mL
Quantiferon Nil Value: 0.03 IU/mL
Quantiferon Tb Ag Minus Nil Value: <0 IU/mL

## 2015-12-01 NOTE — Telephone Encounter (Signed)
I will call patient to advise labs are normal  

## 2015-12-01 NOTE — Telephone Encounter (Signed)
I have called patient to advise labs are normal  

## 2016-01-27 ENCOUNTER — Telehealth: Payer: Self-pay | Admitting: Pharmacist

## 2016-01-27 NOTE — Telephone Encounter (Signed)
Received fax from [facility] patient assistance program for [facility] requesting provider portion for 2018 enrollment.    Last visit: 09/05/15 Next visit: 02/01/16 Labs: 11/29/15 CBC normal; CMP with glucose 57, Cr 1.58, ALT 49, GFR 40; TB negative; 09/17/14 lipid panel normal  Okay to refill [name] per Dr. Estanislado Pandy.  Form was signed by Dr. Estanislado Pandy and faxed to [facility] at [phone].  Called patient to update him.  Patient confirms he has already submitted his portion of the application.  Patient confirms he is not out of medication at this time.  Advised patient to ensure his primary care provider is monitoring his lipid panel.  Patient voiced understanding.     Elisabeth Most, Pharm.D., BCPS Clinical Pharmacist Pager: [phone] Phone: [phone] 01/27/2016 1:44 PM

## 2016-01-30 DIAGNOSIS — Z96651 Presence of right artificial knee joint: Secondary | ICD-10-CM | POA: Insufficient documentation

## 2016-01-30 DIAGNOSIS — M19042 Primary osteoarthritis, left hand: Secondary | ICD-10-CM

## 2016-01-30 DIAGNOSIS — M47816 Spondylosis without myelopathy or radiculopathy, lumbar region: Secondary | ICD-10-CM | POA: Insufficient documentation

## 2016-01-30 DIAGNOSIS — Z79899 Other long term (current) drug therapy: Secondary | ICD-10-CM | POA: Insufficient documentation

## 2016-01-30 DIAGNOSIS — M47812 Spondylosis without myelopathy or radiculopathy, cervical region: Secondary | ICD-10-CM | POA: Insufficient documentation

## 2016-01-30 DIAGNOSIS — M19072 Primary osteoarthritis, left ankle and foot: Secondary | ICD-10-CM

## 2016-01-30 DIAGNOSIS — M19041 Primary osteoarthritis, right hand: Secondary | ICD-10-CM | POA: Insufficient documentation

## 2016-01-30 DIAGNOSIS — Z8719 Personal history of other diseases of the digestive system: Secondary | ICD-10-CM | POA: Insufficient documentation

## 2016-01-30 DIAGNOSIS — M19071 Primary osteoarthritis, right ankle and foot: Secondary | ICD-10-CM | POA: Insufficient documentation

## 2016-01-30 DIAGNOSIS — M0609 Rheumatoid arthritis without rheumatoid factor, multiple sites: Secondary | ICD-10-CM | POA: Insufficient documentation

## 2016-01-30 NOTE — Progress Notes (Deleted)
Office Visit Note  Patient: Jose Meza             Date of Birth: 1931/11/30           MRN: [id number]             PCP: Gwendolyn Grant, MD Referring: Rowe Clack, MD Visit Date: 02/01/2016 Occupation: @GUAROCC @    Subjective:  No chief complaint on file.   History of Present Illness: Jose Meza is a 83 y.o. male ***   Activities of Daily Living:  Patient reports morning stiffness for *** {minute/hour:19697}.   Patient {ACTIONS;DENIES/REPORTS:21021675::"Denies"} nocturnal pain.  Difficulty dressing/grooming: {ACTIONS;DENIES/REPORTS:21021675::"Denies"} Difficulty climbing stairs: {ACTIONS;DENIES/REPORTS:21021675::"Denies"} Difficulty getting out of chair: {ACTIONS;DENIES/REPORTS:21021675::"Denies"} Difficulty using hands for taps, buttons, cutlery, and/or writing: {ACTIONS;DENIES/REPORTS:21021675::"Denies"}   No Rheumatology ROS completed.   PMFS History:  Patient Active Problem List   Diagnosis Date Noted  . Rheumatoid arthritis of multiple sites with negative rheumatoid factor ([place]) 01/30/2016  . High risk medication use 01/30/2016  . History of total knee replacement, right 01/30/2016  . Primary osteoarthritis of both hands 01/30/2016  . Primary osteoarthritis of both feet 01/30/2016  . DDD C-spine 01/30/2016  . Osteoarthritis of lumbar spine 01/30/2016  . History of IBS 01/30/2016  . Hyperlipidemia   . DJD (degenerative joint disease) of knee   . Allergic rhinitis, seasonal   . BPH (benign prostatic hypertrophy)   . Hypertension   . Arthritis   . GERD (gastroesophageal reflux disease)   . CKD (chronic kidney disease) stage 3, GFR 30-59 ml/min   . Polyp of colon, adenomatous 07/15/2009    Past Medical History:  Diagnosis Date  . Allergic rhinitis, seasonal   . Arthritis    neg rheum eval  . BPH (benign prostatic hypertrophy)    recurrent prostatitis, hx BOO  . Cauda equina, syndrome   . Chronic kidney disease    stage 2-3  . DJD  (degenerative joint disease) of knee    right knee, s/p knee replacement 12/06  . GERD (gastroesophageal reflux disease)   . Hyperlipidemia   . Hypertension   . IBS (irritable bowel syndrome)    constipation prone  . Incomplete RBBB    ECG 05/08/13  . Polyp of colon, adenomatous 07/2009   buccini    Family History  Problem Relation Age of Onset  . Colon cancer Mother    Past Surgical History:  Procedure Laterality Date  . JOINT REPLACEMENT  12/2004   R TKR  . Right knee replacement  2007  . [place]  . 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   Physical Exam   Musculoskeletal Exam: ***  CDAI Exam: No CDAI exam completed.    Investigation: Findings:  March 2015 TB: Negative, hepatitis panel November 2012 negative, SPEP August 2012 normal 11/29/2015 CBC platelets 123, CMP ALT 49, GFR 40, TB: Negative    Imaging: No results found.  Speciality Comments: No specialty comments available.    Procedures:  No procedures performed Allergies: Sulfa antibiotics   Assessment / Plan:     Visit Diagnoses: Rheumatoid arthritis of multiple sites with negative rheumatoid factor (HCC) - Negative RF, nodulosis with positive biopsy  High risk medication use - Xeljanz 5 mg  by mouth daily  History of total knee replacement, right  Primary osteoarthritis of both hands  Primary osteoarthritis of both feet  DDD C-spine  DDD lumbar spine - Status post fusion and decompression laminectomy by Dr. Ellene Route, History of cauda equina with L5 weakness, kyphosis, scoliosis  Mixed hyperlipidemia  Essential hypertension  Gastroesophageal reflux disease without esophagitis  CKD (chronic kidney disease) stage 3, GFR 30-59 ml/min  Chronic seasonal allergic rhinitis due to other  allergen  History of IBS  Lipid panel due  Orders: No orders of the defined types were placed in this encounter.  No orders of the defined types were placed in this encounter.   Face-to-face time spent with patient was *** minutes. 50% of time was spent in counseling and coordination of care.  Follow-Up Instructions: No Follow-up on file.   Bo Merino, MD  Note - This record has been created using Editor, commissioning.  Chart creation errors have been sought, but may not always  have been located. Such creation errors do not reflect on  the standard of medical care.

## 2016-02-01 ENCOUNTER — Ambulatory Visit: Payer: Self-pay | Admitting: Rheumatology

## 2016-02-06 ENCOUNTER — Telehealth: Payer: Self-pay

## 2016-02-06 NOTE — Telephone Encounter (Signed)
Received a fax from [facility] stating that they were unable to locate patient with the information provided to complete a prior authorization for xeljanz. This authorization is required in order for the patient to be considered for the [employer] Patient Assistant program.   Spoke with Corey Skains, a representative from [place] who states that the wrong application was faxed to the office. The patient has [facility] and not [facility]. Submitted a prior authorization to [employer] through cover my meds. Will contact [employer] when we receive a response and documents will be sent to scan center.    Rolene Andrades, [place], CPhT

## 2016-03-06 DIAGNOSIS — N183 Chronic kidney disease, stage 3 (moderate): Secondary | ICD-10-CM | POA: Diagnosis not present

## 2016-03-06 DIAGNOSIS — F419 Anxiety disorder, unspecified: Secondary | ICD-10-CM | POA: Diagnosis not present

## 2016-03-06 DIAGNOSIS — I129 Hypertensive chronic kidney disease with stage 1 through stage 4 chronic kidney disease, or unspecified chronic kidney disease: Secondary | ICD-10-CM | POA: Diagnosis not present

## 2016-03-13 ENCOUNTER — Encounter: Payer: Self-pay | Admitting: Pharmacist

## 2016-03-13 NOTE — Progress Notes (Signed)
Received a letter from [employer] regarding a possible adverse event with Jose Meza.  Reported event: slight increased in creatinine.  Reviewed patient's chart.  Noted that patient had slightly increased creatinine prior to initiation of Xeljanz.  Patient's renal function is stable.  I discussed with Dr. Estanislado Pandy who does not feel like the adverse event is related to use of the medication.  Form was signed by Dr. Estanislado Pandy, and response was sent back to [place].  Will send a copy to be scanned.     Elisabeth Most, Pharm.D., BCPS, CPP Clinical Pharmacist Pager: [phone] Phone: [phone] 03/13/2016 4:59 PM

## 2016-03-30 ENCOUNTER — Other Ambulatory Visit: Payer: Self-pay | Admitting: Rheumatology

## 2016-03-30 MED ORDER — TOFACITINIB CITRATE 5 MG PO TABS: ORAL_TABLET | ORAL | 0 refills | Status: DC

## 2016-03-30 NOTE — Telephone Encounter (Signed)
Last Visit: 09/05/15 Next Visit: 04/27/16 Labs: 11/29/15 WNL Patient will come Monday to update labs.  Okay to refill Jose Meza?

## 2016-03-30 NOTE — Telephone Encounter (Signed)
[facility] pharmacy called requesting a new rx for xeljanz with refills. Pharmacy is requesting this be sent today since patient is expecting a shipment by Tuesday.   Fax# [phone] cb# [phone]

## 2016-03-30 NOTE — Telephone Encounter (Signed)
Ok to give 30d

## 2016-04-02 ENCOUNTER — Other Ambulatory Visit: Payer: Self-pay | Admitting: *Deleted

## 2016-04-02 DIAGNOSIS — Z79899 Other long term (current) drug therapy: Secondary | ICD-10-CM | POA: Diagnosis not present

## 2016-04-02 DIAGNOSIS — Z9225 Personal history of immunosupression therapy: Secondary | ICD-10-CM

## 2016-04-02 LAB — CBC WITH DIFFERENTIAL/PLATELET
BASOS PCT: 0 %
Basophils Absolute: 0 cells/uL (ref 0–200)
EOS PCT: 1 %
Eosinophils Absolute: 71 cells/uL (ref 15–500)
HCT: 44.4 % (ref 38.5–50.0)
Hemoglobin: 15 g/dL (ref 13.2–17.1)
LYMPHS ABS: 1065 {cells}/uL (ref 850–3900)
LYMPHS PCT: 15 %
MCH: 33.6 pg — ABNORMAL HIGH (ref 27.0–33.0)
MCHC: 33.8 g/dL (ref 32.0–36.0)
MCV: 99.3 fL (ref 80.0–100.0)
MPV: 10.1 fL (ref 7.5–12.5)
Monocytes Absolute: 781 cells/uL (ref 200–950)
Monocytes Relative: 11 %
NEUTROS PCT: 73 %
Neutro Abs: 5183 cells/uL (ref 1500–7800)
PLATELETS: 156 10*3/uL (ref 140–400)
RBC: 4.47 MIL/uL (ref 4.20–5.80)
RDW: 14.3 % (ref 11.0–15.0)
WBC: 7.1 10*3/uL (ref 3.8–10.8)

## 2016-04-02 LAB — COMPLETE METABOLIC PANEL WITH GFR
ALBUMIN: 4.1 g/dL (ref 3.6–5.1)
ALK PHOS: 60 U/L (ref 40–115)
ALT: 31 U/L (ref 9–46)
AST: 23 U/L (ref 10–35)
BILIRUBIN TOTAL: 0.5 mg/dL (ref 0.2–1.2)
BUN: 32 mg/dL — AB (ref 7–25)
CO2: 24 mmol/L (ref 20–31)
Calcium: 9.3 mg/dL (ref 8.6–10.3)
Chloride: 107 mmol/L (ref 98–110)
Creat: 1.45 mg/dL — ABNORMAL HIGH (ref 0.70–1.11)
GFR, EST NON AFRICAN AMERICAN: 45 mL/min — AB (ref >=60)
GFR, Est African American: 51 mL/min — ABNORMAL LOW (ref >=60)
GLUCOSE: 58 mg/dL — AB (ref 65–99)
Potassium: 4.1 mmol/L (ref 3.5–5.3)
SODIUM: 142 mmol/L (ref 135–146)
Total Protein: 6.8 g/dL (ref 6.1–8.1)

## 2016-04-03 ENCOUNTER — Telehealth: Payer: Self-pay | Admitting: Radiology

## 2016-04-03 NOTE — Telephone Encounter (Signed)
I have called patient to advise labs are stable  

## 2016-04-03 NOTE — Progress Notes (Signed)
Labs are stable.

## 2016-04-03 NOTE — Telephone Encounter (Signed)
-----   Message from Bo Merino, MD sent at 04/03/2016 12:37 PM EDT ----- Labs are stable.

## 2016-04-20 NOTE — Progress Notes (Signed)
Office Visit Note  Patient: Jose Meza             Date of Birth: 11-Feb-1931           MRN: [id number]             PCP: No PCP Per Patient Referring: Rowe Clack, MD Visit Date: 04/27/2016 Occupation: _0 @    Subjective:  Follow-up   History of Present Illness: Jose Meza is a 83 y.o. male  Rheumatoid arthritis is doing well. No change in symptoms. Has some minor stiffness that resolves in a day or 2 on its own. Taking Xeljanz 5 mg daily.  Has seen kidney specialist. They have put him on a diuretic. Doing well. Creatinine is slightly elevated with GFR slightly low at 45. Patient states his kidney doctors are happy with his kidney function.  Patient's labs are otherwise within normal range.  Patient's blood pressure is also doing better after being placed on diuretic.    Activities of Daily Living:  Patient reports morning stiffness for 15 minutes.   Patient Denies nocturnal pain.  Difficulty dressing/grooming: Denies Difficulty climbing stairs: Reports Difficulty getting out of chair: Denies Difficulty using hands for taps, buttons, cutlery, and/or writing: Reports   Review of Systems  Constitutional: Negative for fatigue.  HENT: Negative for mouth sores and mouth dryness.   Eyes: Negative for dryness.  Respiratory: Negative for shortness of breath.   Gastrointestinal: Negative for constipation and diarrhea.  Musculoskeletal: Negative for myalgias and myalgias.  Skin: Negative for sensitivity to sunlight.  Neurological: Negative for memory loss.  Psychiatric/Behavioral: Negative for sleep disturbance.    PMFS History:  Patient Active Problem List   Diagnosis Date Noted  . Rheumatoid arthritis of multiple sites with negative rheumatoid factor ([place]) 01/30/2016  . High risk medication use 01/30/2016  . History of total knee replacement, right 01/30/2016  . Primary osteoarthritis of both hands 01/30/2016  . Primary osteoarthritis of both  feet 01/30/2016  . DDD C-spine 01/30/2016  . Osteoarthritis of lumbar spine 01/30/2016  . History of IBS 01/30/2016  . Hyperlipidemia   . DJD (degenerative joint disease) of knee   . Allergic rhinitis, seasonal   . BPH (benign prostatic hyperplasia)   . Hypertension   . Arthritis   . GERD (gastroesophageal reflux disease)   . CKD (chronic kidney disease) stage 3, GFR 30-59 ml/min   . Polyp of colon, adenomatous 07/15/2009    Past Medical History:  Diagnosis Date  . Allergic rhinitis, seasonal   . Arthritis    neg rheum eval  . BPH (benign prostatic hypertrophy)    recurrent prostatitis, hx BOO  . Cauda equina, syndrome   . Chronic kidney disease    stage 2-3  . DJD (degenerative joint disease) of knee    right knee, s/p knee replacement 12/06  . GERD (gastroesophageal reflux disease)   . Hyperlipidemia   . Hypertension   . IBS (irritable bowel syndrome)    constipation prone  . Incomplete RBBB    ECG 05/08/13  . Polyp of colon, adenomatous 07/2009   buccini    Family History  Problem Relation Age of Onset  . Colon cancer Mother    Past Surgical History:  Procedure Laterality Date  . JOINT REPLACEMENT  12/2004   R TKR  . Right knee replacement  2007  . [place]  . 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 He appears well-developed and well-nourished.  HENT:  Head: Normocephalic and atraumatic.  Eyes: Conjunctivae and EOM are normal. Pupils are equal, round, and reactive to light.  Neck: Normal range of motion. Neck supple.  Cardiovascular: Normal rate, regular rhythm and normal heart sounds.  Exam reveals no gallop and no friction  rub.   No murmur heard. Pulmonary/Chest: Effort normal and breath sounds normal. No respiratory distress. He has no wheezes. He has no rales. He exhibits no tenderness.  Abdominal: Soft. He exhibits no distension and no mass. There is no tenderness. There is no guarding.  Musculoskeletal: Normal range of motion.  Lymphadenopathy:    He has no cervical adenopathy.  Neurological: He is alert and oriented to person, place, and time. He exhibits normal muscle tone. Coordination normal.  Skin: Skin is warm and dry. Capillary refill takes less than 2 seconds. No rash noted.  Psychiatric: He has a normal mood and affect. His behavior is normal. Judgment and thought content normal.  Vitals reviewed.    Musculoskeletal Exam:  Limited extension of bilateral wrists Otherwise full range of motion of all joints Note most nodules are nearly resolved  Right knee doing well. Left knee doing well. Both of them do not have any warmth effusion or tenderness.  CDAI Exam: CDAI Homunculus Exam:   Joint Counts:  CDAI Tender Joint count: 0 CDAI Swollen Joint count: 0  Global Assessments:  Patient Global Assessment: 1 Provider Global Assessment: 1  CDAI Calculated Score: 2    Investigation: Findings:  x-ray results from 12/30/2014:  Bilateral hands 2 views versus August 2012 show right 2nd MCP with joint space narrowing, bilateral PIP and DIP joint space narrowing.  No changes versus 2012.  No erosions.  X-rays of bilateral feet 2 views versus August 2012 show bilateral 1st MTP joint space narrowing, bilateral PIP and DIP join space narrowing.  No erosions.  No changes versus August 2012.    Labs from September 01, 2010 show SPEP with M spike not detected. An MRI of the right hand is negative. No inflammatory arthritis per report.  DEXA is normal.  Labs from August 2012 of CMP shows creatinine increased to 1.45; GFR is decreased at 47 with CBC normal. ESR is normal at 4; uric acid is normal at 6.0; ACE  is negative; urinalysis is negative; HLA-B27 is negative.  11/19/2015 negative TB gold    Orders Only on 04/02/2016  Component Date Value Ref Range Status  . WBC 04/02/2016 7.1  3.8 - 10.8 K/uL Final  . RBC 04/02/2016 4.47  4.20 - 5.80 MIL/uL Final  . Hemoglobin 04/02/2016 15.0  13.2 - 17.1 g/dL Final  . HCT 04/02/2016 44.4  38.5 - 50.0 % Final  . MCV 04/02/2016 99.3  80.0 - 100.0 fL Final  . MCH 04/02/2016 33.6* 27.0 - 33.0 pg Final  . MCHC 04/02/2016 33.8  32.0 - 36.0 g/dL Final  . RDW 04/02/2016 14.3  11.0 - 15.0 % Final  . Platelets 04/02/2016 156  140 - 400 K/uL Final  . MPV 04/02/2016 10.1  7.5 - 12.5  fL Final  . Neutro Abs 04/02/2016 5183  1,500 - 7,800 cells/uL Final  . Lymphs Abs 04/02/2016 1065  850 - 3,900 cells/uL Final  . Monocytes Absolute 04/02/2016 781  200 - 950 cells/uL Final  . Eosinophils Absolute 04/02/2016 71  15 - 500 cells/uL Final  . Basophils Absolute 04/02/2016 0  0 - 200 cells/uL Final  . Neutrophils Relative % 04/02/2016 73  % Final  . Lymphocytes Relative 04/02/2016 15  % Final  . Monocytes Relative 04/02/2016 11  % Final  . Eosinophils Relative 04/02/2016 1  % Final  . Basophils Relative 04/02/2016 0  % Final  . Smear Review 04/02/2016 Criteria for review not met   Final  . Sodium 04/02/2016 142  135 - 146 mmol/L Final  . Potassium 04/02/2016 4.1  3.5 - 5.3 mmol/L Final  . Chloride 04/02/2016 107  98 - 110 mmol/L Final  . CO2 04/02/2016 24  20 - 31 mmol/L Final  . Glucose, Bld 04/02/2016 58* 65 - 99 mg/dL Final  . BUN 04/02/2016 32* 7 - 25 mg/dL Final  . Creat 04/02/2016 1.45* 0.70 - 1.11 mg/dL Final   Comment:   For patients > or = 81 years of age: The upper reference limit for Creatinine is approximately 13% higher for people identified as African-American.     . Total Bilirubin 04/02/2016 0.5  0.2 - 1.2 mg/dL Final  . Alkaline Phosphatase 04/02/2016 60  40 - 115 U/L Final  . AST 04/02/2016 23  10 - 35 U/L Final  . ALT 04/02/2016 31  9 - 46  U/L Final  . Total Protein 04/02/2016 6.8  6.1 - 8.1 g/dL Final  . Albumin 04/02/2016 4.1  3.6 - 5.1 g/dL Final  . Calcium 04/02/2016 9.3  8.6 - 10.3 mg/dL Final  . GFR, Est African American 04/02/2016 51* >=60 mL/min Final  . GFR, Est Non African American 04/02/2016 45* >=60 mL/min Final    Imaging: No results found.  Speciality Comments: No specialty comments available.    Procedures:  No procedures performed Allergies: Sulfa antibiotics   Assessment / Plan:     Visit Diagnoses: Rheumatoid arthritis of multiple sites with negative rheumatoid factor ([facility]) - With nodulosis and history of severe synovitis  High risk medication use - On Xeljanz 5 mg by mouth daily  Primary osteoarthritis of both hands  Primary osteoarthritis of both feet  History of total knee replacement, right  DDD C-spine  Spondylosis of lumbar region without myelopathy or radiculopathy - With kyphosis scoliosis and history of cauda equina, status post discectomy residual lower extremity weakness  History of IBS  CKD (chronic kidney disease) stage 3, GFR 30-59 ml/min - Followed up by Dr. Mercy Moore  Chronic seasonal allergic rhinitis due to other allergen  History of hyperlipidemia  History of BPH  History of gastroesophageal reflux (GERD)  History of hypertension   Plan:  #1: Rheumatoid arthritis. Doing well. Responding well to Xeljanz 5 mg once a day. Patient had history of Nodulosis in the past (initially) and they have since resolved.  #2: High-risk prescription. On Xeljanz 5 mg daily. Doing well. Has minor joint discomfort from time to time but resolves on its own in one or 2 days.  #3: Chronic kidney disease. Sees Dr. Mercy Moore. Placed him on a diuretic and doing well.  #4: History of osteoarthritis to bilateral hands and feet.  #5: Return to clinic in 5 months  His medical history is complicated by Chronic Kidney disease, GERD, Colon  Polyps, IBS, hypertension, BPH, and  allergies which are being managed by Dr. Asa Lente.   Orders: No orders of the defined types were placed in this encounter.  No orders of the defined types were placed in this encounter.   Face-to-face time spent with patient was 30 minutes. 50% of time was spent in counseling and coordination of care.  Follow-Up Instructions: No Follow-up on file.   Eliezer Lofts, PA-C  On my exam today patient had no synovitis. The range of motion in his PIP and MCP joints have improved. Most of the rheumatoid nodules have resolved. We decided to keep him on Xeljanz 5 mg by mouth daily. I examined and evaluated the patient with Eliezer Lofts PA. The plan of care was discussed as noted above.  Bo Merino, MD Note - This record has been created using Editor, commissioning.  Chart creation errors have been sought, but may not always  have been located. Such creation errors do not reflect on  the standard of medical care.

## 2016-04-25 DIAGNOSIS — N400 Enlarged prostate without lower urinary tract symptoms: Secondary | ICD-10-CM | POA: Diagnosis not present

## 2016-04-25 DIAGNOSIS — N411 Chronic prostatitis: Secondary | ICD-10-CM | POA: Diagnosis not present

## 2016-04-27 ENCOUNTER — Ambulatory Visit (INDEPENDENT_AMBULATORY_CARE_PROVIDER_SITE_OTHER): Payer: Medicare Other | Admitting: Rheumatology

## 2016-04-27 ENCOUNTER — Encounter: Payer: Self-pay | Admitting: Rheumatology

## 2016-04-27 ENCOUNTER — Encounter (INDEPENDENT_AMBULATORY_CARE_PROVIDER_SITE_OTHER): Payer: Self-pay

## 2016-04-27 VITALS — BP 130/74 | HR 74 | Resp 14 | Ht 77.0 in | Wt 204.0 lb

## 2016-04-27 DIAGNOSIS — Z79899 Other long term (current) drug therapy: Secondary | ICD-10-CM

## 2016-04-27 DIAGNOSIS — Z96651 Presence of right artificial knee joint: Secondary | ICD-10-CM

## 2016-04-27 DIAGNOSIS — M47816 Spondylosis without myelopathy or radiculopathy, lumbar region: Secondary | ICD-10-CM

## 2016-04-27 DIAGNOSIS — M19041 Primary osteoarthritis, right hand: Secondary | ICD-10-CM

## 2016-04-27 DIAGNOSIS — N183 Chronic kidney disease, stage 3 unspecified: Secondary | ICD-10-CM

## 2016-04-27 DIAGNOSIS — Z8639 Personal history of other endocrine, nutritional and metabolic disease: Secondary | ICD-10-CM

## 2016-04-27 DIAGNOSIS — M19071 Primary osteoarthritis, right ankle and foot: Secondary | ICD-10-CM | POA: Diagnosis not present

## 2016-04-27 DIAGNOSIS — Z87438 Personal history of other diseases of male genital organs: Secondary | ICD-10-CM

## 2016-04-27 DIAGNOSIS — M19072 Primary osteoarthritis, left ankle and foot: Secondary | ICD-10-CM

## 2016-04-27 DIAGNOSIS — M19042 Primary osteoarthritis, left hand: Secondary | ICD-10-CM

## 2016-04-27 DIAGNOSIS — M0609 Rheumatoid arthritis without rheumatoid factor, multiple sites: Secondary | ICD-10-CM

## 2016-04-27 DIAGNOSIS — M5030 Other cervical disc degeneration, unspecified cervical region: Secondary | ICD-10-CM

## 2016-04-27 DIAGNOSIS — Z8679 Personal history of other diseases of the circulatory system: Secondary | ICD-10-CM

## 2016-04-27 DIAGNOSIS — Z8719 Personal history of other diseases of the digestive system: Secondary | ICD-10-CM | POA: Diagnosis not present

## 2016-04-27 DIAGNOSIS — J302 Other seasonal allergic rhinitis: Secondary | ICD-10-CM

## 2016-04-27 DIAGNOSIS — M47812 Spondylosis without myelopathy or radiculopathy, cervical region: Secondary | ICD-10-CM

## 2016-05-09 DIAGNOSIS — N183 Chronic kidney disease, stage 3 (moderate): Secondary | ICD-10-CM | POA: Diagnosis not present

## 2016-05-09 DIAGNOSIS — I129 Hypertensive chronic kidney disease with stage 1 through stage 4 chronic kidney disease, or unspecified chronic kidney disease: Secondary | ICD-10-CM | POA: Diagnosis not present

## 2016-05-09 DIAGNOSIS — N390 Urinary tract infection, site not specified: Secondary | ICD-10-CM | POA: Diagnosis not present

## 2016-05-18 DIAGNOSIS — F4321 Adjustment disorder with depressed mood: Secondary | ICD-10-CM | POA: Diagnosis not present

## 2016-05-25 DIAGNOSIS — F4321 Adjustment disorder with depressed mood: Secondary | ICD-10-CM | POA: Diagnosis not present

## 2016-05-29 ENCOUNTER — Other Ambulatory Visit: Payer: Self-pay | Admitting: Rheumatology

## 2016-05-29 ENCOUNTER — Telehealth: Payer: Self-pay | Admitting: Rheumatology

## 2016-05-29 NOTE — Telephone Encounter (Signed)
Patient called requesting a refill on Xelijanx.

## 2016-05-29 NOTE — Telephone Encounter (Signed)
Last Visit: 04/27/16 Next Visit due in September 2018. Message sent to front to schedule patient. Labs: 04/02/16 Stable  Okay to refill Jose Meza?

## 2016-05-29 NOTE — Telephone Encounter (Signed)
ok 

## 2016-05-30 ENCOUNTER — Telehealth: Payer: Self-pay | Admitting: Rheumatology

## 2016-05-30 NOTE — Telephone Encounter (Signed)
Opened in error

## 2016-05-30 NOTE — Telephone Encounter (Signed)
Patient advised prescription has been sent to the pharmacy.  

## 2016-05-31 NOTE — Telephone Encounter (Signed)
Left a message for patient to return my call to schedule rov. 5/16

## 2016-06-01 DIAGNOSIS — F4321 Adjustment disorder with depressed mood: Secondary | ICD-10-CM | POA: Diagnosis not present

## 2016-06-08 DIAGNOSIS — F4321 Adjustment disorder with depressed mood: Secondary | ICD-10-CM | POA: Diagnosis not present

## 2016-06-15 DIAGNOSIS — F4321 Adjustment disorder with depressed mood: Secondary | ICD-10-CM | POA: Diagnosis not present

## 2016-06-22 DIAGNOSIS — F4321 Adjustment disorder with depressed mood: Secondary | ICD-10-CM | POA: Diagnosis not present

## 2016-07-04 DIAGNOSIS — F4321 Adjustment disorder with depressed mood: Secondary | ICD-10-CM | POA: Diagnosis not present

## 2016-07-13 DIAGNOSIS — F4321 Adjustment disorder with depressed mood: Secondary | ICD-10-CM | POA: Diagnosis not present

## 2016-07-20 DIAGNOSIS — F4321 Adjustment disorder with depressed mood: Secondary | ICD-10-CM | POA: Diagnosis not present

## 2016-07-26 ENCOUNTER — Telehealth: Payer: Self-pay | Admitting: Rheumatology

## 2016-07-26 NOTE — Telephone Encounter (Signed)
[name] from [employer] (Too much background noise to hear clearly the company name) left a message requesting a new prescription for the patient's Jose Meza.  This can be faxed in to [phone] or call into tele [phone].  Thank you.

## 2016-07-26 NOTE — Telephone Encounter (Signed)
Last visit in April 2018  Sent message to front desk to make appt for him to make a  follow up     CBC Latest Ref Rng & Units 04/02/2016 11/29/2015 02/10/2014  WBC 3.8 - 10.8 K/uL 7.1 5.6 7.5  Hemoglobin 13.2 - 17.1 g/dL 15.0 14.4 15.0  Hematocrit 38.5 - 50.0 % 44.4 42.8 44  Platelets 140 - 400 K/uL 156 123(L) 166    CMP Latest Ref Rng & Units 04/02/2016 11/29/2015 02/10/2014  Glucose 65 - 99 mg/dL 58(L) 57(L) -  BUN 7 - 25 mg/dL 32(H) 25 27(A)  Creatinine 0.70 - 1.11 mg/dL 1.45(H) 1.58(H) 1.3  Sodium 135 - 146 mmol/L 142 140 141  Potassium 3.5 - 5.3 mmol/L 4.1 3.9 3.8  Chloride 98 - 110 mmol/L 107 106 -  CO2 20 - 31 mmol/L 24 27 -  Calcium 8.6 - 10.3 mg/dL 9.3 9.0 -  Total Protein 6.1 - 8.1 g/dL 6.8 6.5 -  Total Bilirubin 0.2 - 1.2 mg/dL 0.5 0.4 -  Alkaline Phos 40 - 115 U/L 60 60 88  AST 10 - 35 U/L 23 31 19   ALT 9 - 46 U/L 31 49(H) 24   Labs due for refill of Xeljanz TB gold negative 11/2015  He is on 5mg  once daily. I have called him for a  Lab plan, he states he can come in on Monday  Ok to refill his [name] ?

## 2016-07-26 NOTE — Telephone Encounter (Signed)
Discussed with Dr Estanislado Pandy ok to refill 30 day supply Xeljanz 5mg  once daily

## 2016-07-27 DIAGNOSIS — F4321 Adjustment disorder with depressed mood: Secondary | ICD-10-CM | POA: Diagnosis not present

## 2016-07-27 NOTE — Telephone Encounter (Signed)
Prescription called in. Bottle unable to be broken so had to authorize 60 day supply since patient is taking it once a day.

## 2016-07-30 ENCOUNTER — Other Ambulatory Visit: Payer: Self-pay

## 2016-07-30 DIAGNOSIS — Z79899 Other long term (current) drug therapy: Secondary | ICD-10-CM | POA: Diagnosis not present

## 2016-07-30 DIAGNOSIS — Z9225 Personal history of immunosupression therapy: Secondary | ICD-10-CM

## 2016-07-30 DIAGNOSIS — H26493 Other secondary cataract, bilateral: Secondary | ICD-10-CM | POA: Diagnosis not present

## 2016-07-30 LAB — CBC WITH DIFFERENTIAL/PLATELET
BASOS PCT: 0 %
Basophils Absolute: 0 cells/uL (ref 0–200)
EOS ABS: 132 {cells}/uL (ref 15–500)
Eosinophils Relative: 2 %
HEMATOCRIT: 42.3 % (ref 38.5–50.0)
HEMOGLOBIN: 14.4 g/dL (ref 13.2–17.1)
LYMPHS ABS: 1056 {cells}/uL (ref 850–3900)
LYMPHS PCT: 16 %
MCH: 33.3 pg — ABNORMAL HIGH (ref 27.0–33.0)
MCHC: 34 g/dL (ref 32.0–36.0)
MCV: 97.7 fL (ref 80.0–100.0)
MONO ABS: 660 {cells}/uL (ref 200–950)
MPV: 10.4 fL (ref 7.5–12.5)
Monocytes Relative: 10 %
NEUTROS ABS: 4752 {cells}/uL (ref 1500–7800)
Neutrophils Relative %: 72 %
Platelets: 148 10*3/uL (ref 140–400)
RBC: 4.33 MIL/uL (ref 4.20–5.80)
RDW: 14 % (ref 11.0–15.0)
WBC: 6.6 10*3/uL (ref 3.8–10.8)

## 2016-07-30 LAB — COMPLETE METABOLIC PANEL WITH GFR
ALBUMIN: 3.8 g/dL (ref 3.6–5.1)
ALT: 32 U/L (ref 9–46)
AST: 25 U/L (ref 10–35)
Alkaline Phosphatase: 57 U/L (ref 40–115)
BILIRUBIN TOTAL: 0.5 mg/dL (ref 0.2–1.2)
BUN: 31 mg/dL — ABNORMAL HIGH (ref 7–25)
CALCIUM: 8.5 mg/dL — AB (ref 8.6–10.3)
CO2: 25 mmol/L (ref 20–31)
CREATININE: 1.7 mg/dL — AB (ref 0.70–1.11)
Chloride: 105 mmol/L (ref 98–110)
GFR, EST AFRICAN AMERICAN: 42 mL/min — AB (ref >=60)
GFR, Est Non African American: 37 mL/min — ABNORMAL LOW (ref >=60)
Glucose, Bld: 102 mg/dL — ABNORMAL HIGH (ref 65–99)
Potassium: 3.6 mmol/L (ref 3.5–5.3)
Sodium: 140 mmol/L (ref 135–146)
TOTAL PROTEIN: 6.7 g/dL (ref 6.1–8.1)

## 2016-07-30 NOTE — Progress Notes (Signed)
Elevation in creatinine. Please notify patient and forward labs to his nephrologist and PCP

## 2016-08-01 ENCOUNTER — Telehealth: Payer: Self-pay

## 2016-08-01 NOTE — Telephone Encounter (Signed)
[facility], Dr. Carlyle Lipa office would like patient's last 2 office notes and last lab results faxed to there office at [phone].  Please advise.  Thank You.  CB# is [phone].

## 2016-08-01 NOTE — Telephone Encounter (Signed)
Labs and office notes faxed to PCP.

## 2016-08-03 DIAGNOSIS — F4321 Adjustment disorder with depressed mood: Secondary | ICD-10-CM | POA: Diagnosis not present

## 2016-08-06 DIAGNOSIS — I129 Hypertensive chronic kidney disease with stage 1 through stage 4 chronic kidney disease, or unspecified chronic kidney disease: Secondary | ICD-10-CM | POA: Diagnosis not present

## 2016-08-06 DIAGNOSIS — N400 Enlarged prostate without lower urinary tract symptoms: Secondary | ICD-10-CM | POA: Diagnosis not present

## 2016-08-06 DIAGNOSIS — M069 Rheumatoid arthritis, unspecified: Secondary | ICD-10-CM | POA: Diagnosis not present

## 2016-08-06 DIAGNOSIS — N183 Chronic kidney disease, stage 3 (moderate): Secondary | ICD-10-CM | POA: Diagnosis not present

## 2016-08-10 DIAGNOSIS — F4321 Adjustment disorder with depressed mood: Secondary | ICD-10-CM | POA: Diagnosis not present

## 2016-08-13 ENCOUNTER — Telehealth: Payer: Self-pay | Admitting: Rheumatology

## 2016-08-13 NOTE — Telephone Encounter (Signed)
Patient would like his recent lab work sent to Dr. Mercy Moore, [facility]. Please call patient with any questions, or concerns.

## 2016-08-13 NOTE — Telephone Encounter (Signed)
Copy sent to Dr. Mercy Moore again.

## 2016-08-24 DIAGNOSIS — F4321 Adjustment disorder with depressed mood: Secondary | ICD-10-CM | POA: Diagnosis not present

## 2016-08-31 DIAGNOSIS — F4321 Adjustment disorder with depressed mood: Secondary | ICD-10-CM | POA: Diagnosis not present

## 2016-09-05 DIAGNOSIS — F4321 Adjustment disorder with depressed mood: Secondary | ICD-10-CM | POA: Diagnosis not present

## 2016-09-21 DIAGNOSIS — F4321 Adjustment disorder with depressed mood: Secondary | ICD-10-CM | POA: Diagnosis not present

## 2016-09-24 DIAGNOSIS — F4321 Adjustment disorder with depressed mood: Secondary | ICD-10-CM | POA: Diagnosis not present

## 2016-10-05 DIAGNOSIS — F4321 Adjustment disorder with depressed mood: Secondary | ICD-10-CM | POA: Diagnosis not present

## 2016-10-10 DIAGNOSIS — M069 Rheumatoid arthritis, unspecified: Secondary | ICD-10-CM | POA: Diagnosis not present

## 2016-10-10 DIAGNOSIS — N183 Chronic kidney disease, stage 3 (moderate): Secondary | ICD-10-CM | POA: Diagnosis not present

## 2016-10-10 DIAGNOSIS — N400 Enlarged prostate without lower urinary tract symptoms: Secondary | ICD-10-CM | POA: Diagnosis not present

## 2016-10-10 DIAGNOSIS — I129 Hypertensive chronic kidney disease with stage 1 through stage 4 chronic kidney disease, or unspecified chronic kidney disease: Secondary | ICD-10-CM | POA: Diagnosis not present

## 2016-10-19 ENCOUNTER — Telehealth: Payer: Self-pay | Admitting: Rheumatology

## 2016-10-19 MED ORDER — TOFACITINIB CITRATE 5 MG PO TABS
1.0000 | ORAL_TABLET | Freq: Every day | ORAL | 0 refills | Status: DC
Start: 2016-10-19 — End: 2016-12-17

## 2016-10-19 NOTE — Telephone Encounter (Signed)
[employer] called requesting a refill of xeljanz be sent it. Patient is completely out of medication. Please call [phone] opt.[facility]

## 2016-10-19 NOTE — Telephone Encounter (Signed)
Patient advised prescription has been called to the pharmacy. Patient reminded he is due for labs this month. Patient states he has talked with PCP and is seeing a nephrologist regarding elevated Creat.

## 2016-10-19 NOTE — Telephone Encounter (Signed)
Last Visit: 04/27/16 Next Visit: 11/13/16 Labs: 07/30/16 Elevation in creatinine 1.70 previously 1.45  Okay to refill [name]?

## 2016-10-19 NOTE — Telephone Encounter (Signed)
Follow-up with PCP regarding elevated creatinine

## 2016-10-20 DIAGNOSIS — J019 Acute sinusitis, unspecified: Secondary | ICD-10-CM | POA: Diagnosis not present

## 2016-10-20 DIAGNOSIS — J209 Acute bronchitis, unspecified: Secondary | ICD-10-CM | POA: Diagnosis not present

## 2016-10-23 DIAGNOSIS — J209 Acute bronchitis, unspecified: Secondary | ICD-10-CM | POA: Diagnosis not present

## 2016-10-24 ENCOUNTER — Telehealth: Payer: Self-pay | Admitting: Rheumatology

## 2016-10-24 NOTE — Telephone Encounter (Signed)
Patient states he has bad bronchitis. Patient was given a steroid injection as well as a prescription for Doxycycline. Patient would like to know if he should hold his [name]. Please advise.

## 2016-10-24 NOTE — Telephone Encounter (Signed)
Patient has Bronchitis, and had a Prednisone injection. Patient wants to know if this will affect Xeljanx. Please call to advise ASAP, per patient.

## 2016-10-24 NOTE — Telephone Encounter (Signed)
Advised patient to hold off [name] until his infection clears up.

## 2016-10-25 NOTE — Telephone Encounter (Signed)
Left message to advise patient that he should hold of on Xeljanz until infection clears up.

## 2016-10-30 ENCOUNTER — Telehealth: Payer: Self-pay

## 2016-10-30 NOTE — Telephone Encounter (Signed)
Received a fax from [employer] with a list of patients who will requires re-enrolment through the [place] patient assistance program for 2019. To assist in expediting the application process they are requesting that the provider portion be completed, signed and faxed back in no later than 11/14/2017. Each patient will need a separate, signed application. The application can be found at [email or web address]. Fax: [phone] Phone: [phone]  Called patient to remind them the fill out and sign the patient portion and send it in. Left message for patient to call back. Will complete provider portion and fax document once signed.     Corde Antonini, [place], CPhT 3:20 PM

## 2016-10-31 NOTE — Progress Notes (Signed)
Office Visit Note  Patient: Jose Meza             Date of Birth: 01-Mar-1931           MRN: [id number]             PCP: Lajean Manes, MD Referring: Lajean Manes, MD Visit Date: 11/13/2016 Occupation: @GUAROCC @    Subjective:  Rheumatoid Arthritis (Doing good except allergies - seasonal)   History of Present Illness: Jose Meza is a 83 y.o. male  He did not have any who was last seen in office April 27, 2016 for rheumatoid arthritis. On that visit, his rheumatoid arthritis was greatest was doing well.   Patient is seeing a kidney specialist.  His creatinine was elevated back in April 2018 with GFR slightly low at 45. His kidney specialist is happy with his current kidney function.  Today, pt states he is doing great w/ xeljanz  Pt has been off of [place] since approximately October 19, 2016. Despite being off of [name] for the last 3 weeks, patient has not had any pain from his rheumatoid arthritis.  Patient states that he has been on [place] for approximately 4 years.  When he first started with rheumatoid arthritis, the symptoms first appeared in his MCP joints of his hands. Patient has been watching these joints to see if any flares or happening but fortunately, there has been no flares whatsoever despite being off of the medication for the last 3 weeks.  Patient was able to use one round of antibiotics and some prednisone during his bronchitis.  It was difficult to treated but patient has finished with the bronchitis and only has severe allergenic reaction lingering.  Today on examination his eyes/sclera are red.   Activities of Daily Living:  Patient reports morning stiffness for 15 minutes.   Patient Denies nocturnal pain.  Difficulty dressing/grooming: Denies Difficulty climbing stairs: Denies Difficulty getting out of chair: Denies Difficulty using hands for taps, buttons, cutlery, and/or writing: Denies   Review of Systems  Constitutional: Negative  for fatigue.  HENT: Negative for mouth sores and mouth dryness.   Eyes: Negative for dryness.  Respiratory: Negative for shortness of breath.   Gastrointestinal: Negative for constipation and diarrhea.  Musculoskeletal: Negative for myalgias and myalgias.  Skin: Negative for sensitivity to sunlight.  Neurological: Negative for memory loss.  Psychiatric/Behavioral: Negative for sleep disturbance.    PMFS History:  Patient Active Problem List   Diagnosis Date Noted  . Rheumatoid arthritis of multiple sites with negative rheumatoid factor ([place]) 01/30/2016  . High risk medication use 01/30/2016  . History of total knee replacement, right 01/30/2016  . Primary osteoarthritis of both hands 01/30/2016  . Primary osteoarthritis of both feet 01/30/2016  . DDD C-spine 01/30/2016  . Osteoarthritis of lumbar spine 01/30/2016  . History of IBS 01/30/2016  . Hyperlipidemia   . DJD (degenerative joint disease) of knee   . Allergic rhinitis, seasonal   . BPH (benign prostatic hyperplasia)   . Hypertension   . Arthritis   . GERD (gastroesophageal reflux disease)   . CKD (chronic kidney disease) stage 3, GFR 30-59 ml/min (HCC)   . Polyp of colon, adenomatous 07/15/2009    Past Medical History:  Diagnosis Date  . Allergic rhinitis, seasonal   . Arthritis    neg rheum eval  . BPH (benign prostatic hypertrophy)    recurrent prostatitis, hx BOO  . Cauda equina, syndrome   . Chronic kidney disease  stage 2-3  . DJD (degenerative joint disease) of knee    right knee, s/p knee replacement 12/06  . GERD (gastroesophageal reflux disease)   . Hyperlipidemia   . Hypertension   . IBS (irritable bowel syndrome)    constipation prone  . Incomplete RBBB    ECG 05/08/13  . Polyp of colon, adenomatous 07/2009   buccini  . Rheumatoid arthritis ([place])     Family History  Problem Relation Age of Onset  . Colon cancer Mother    Past Surgical History:  Procedure Laterality Date  . JOINT  REPLACEMENT  12/2004   R TKR  . KNEE ARTHROPLASTY    . Right knee replacement  2007  . [place]  . 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 He appears well-developed and well-nourished.  HENT:  Head: Normocephalic and atraumatic.  Eyes: Pupils are equal, round, and reactive to light. Conjunctivae and EOM are normal.  Neck: Normal range of motion. Neck supple.  Cardiovascular: Normal rate, regular rhythm and normal heart sounds.  Exam reveals no gallop and no friction rub.   No murmur heard. Pulmonary/Chest: Effort normal and breath sounds normal. No respiratory distress. He has no wheezes. He has no rales. He exhibits no tenderness.  Abdominal: Soft. He exhibits no distension and no mass. There is no tenderness. There is no guarding.  Musculoskeletal: Normal range of motion.  Lymphadenopathy:    He has no cervical adenopathy.  Neurological: He is alert and oriented to person, place, and time. He exhibits normal muscle tone. Coordination normal.  Skin: Skin is warm and dry. Capillary refill takes less than 2 seconds. No rash noted.  Psychiatric: He has a normal mood and affect. His behavior is normal. Judgment and thought content normal.  Vitals reviewed.    Musculoskeletal Exam:  Full range of motion of all joints Grip strength is equal and strong bilaterally  fibromyalgia tender points are all absent  CDAI Exam: No CDAI exam completed.  No synovitis on examination  Investigation: No additional findings.TB Gold: negative in 11/2015 CBC Latest Ref Rng & Units 07/30/2016  04/02/2016 11/29/2015  WBC 3.8 - 10.8 K/uL 6.6 7.1 5.6  Hemoglobin 13.2 - 17.1 g/dL 14.4 15.0 14.4  Hematocrit 38.5 - 50.0 % 42.3 44.4 42.8  Platelets 140 - 400 K/uL 148 156 123(L)   CMP Latest Ref Rng & Units 07/30/2016 04/02/2016 11/29/2015  Glucose 65 - 99 mg/dL 102(H) 58(L) 57(L)  BUN 7 - 25 mg/dL 31(H) 32(H) 25  Creatinine 0.70 - 1.11 mg/dL 1.70(H) 1.45(H) 1.58(H)  Sodium 135 - 146 mmol/L 140 142 140  Potassium 3.5 - 5.3 mmol/L 3.6 4.1 3.9  Chloride 98 - 110 mmol/L 105 107 106  CO2 20 - 31 mmol/L 25 24 27   Calcium 8.6 - 10.3 mg/dL 8.5(L) 9.3 9.0  Total Protein 6.1 - 8.1 g/dL 6.7 6.8 6.5  Total Bilirubin 0.2 - 1.2 mg/dL 0.5 0.5 0.4  Alkaline Phos 40 - 115  U/L 57 60 60  AST 10 - 35 U/L 25 23 31   ALT 9 - 46 U/L 32 31 49(H)    Imaging: No results found.  Speciality Comments: No specialty comments available.    Procedures:  No procedures performed Allergies: Sulfa antibiotics   Assessment / Plan:     Visit Diagnoses: Rheumatoid arthritis of multiple sites with negative rheumatoid factor (HCC) - seronegative   High risk medication use - xeljanz - Plan: CBC with Differential/Platelet, COMPLETE METABOLIC PANEL WITH GFR, Quantiferon tb gold assay  Primary osteoarthritis of both hands  Primary osteoarthritis of both feet  Primary osteoarthritis of both knees  History of total right knee replacement  DDD (degenerative disc disease), cervical  DDD (degenerative disc disease), lumbar  History of scoliosis  History of kyphosis  Cauda equina syndrome (HCC)  History of hypertension  History of hyperlipidemia  History of gastroesophageal reflux (GERD)  History of IBS  Decreased GFR  History of chronic kidney disease  History of BPH    Plan: 1.:  Rheumatoid arthritis. Doing well. Has been off of his [name] 5 mg daily for the last 3 weeks because he has been fighting bronchitis. While he has been off of the medication, he has not had any flares. Patient is  hopeful that he may be able to discontinue or decrease dosage of his [name] since he is responding so well. We have asked the patient to take 5 mg every other day for the next 3-4 months to see how well he responds to the lower dose.  2.:  High risk prescription Xeljanz 5 mg every day. Since patient is having adequate response, We will decrease his Xeljanz dose to 5 mg every other day and recheck him in 4 months.  3.:  Normotensive. Patient is on metoprolol and hydrochlorothiazide. In the past, his blood pressure used to be high. Lately, his blood pressure is running lower and as a result he is having periods of lightheadedness as he gets up from a seated position. I have asked the patient to coordinate this with his PCP. He may benefit from decreasing his metoprolol and/or his hydrochlorothiazide.  Patient is agreeable and will coordinate with his PCP.  4.  Return to clinic in 4 months  Orders: No orders of the defined types were placed in this encounter.  No orders of the defined types were placed in this encounter.   Face-to-face time spent with patient was 30 minutes. 50% of time was spent in counseling and coordination of care.  Follow-Up Instructions: No Follow-up on file.   Eliezer Lofts, PA-C   I examined and evaluated the patient with Eliezer Lofts PA. Patient had no synovitis or nodulosis on my exam today. I've advised him to reduce the [place] stools to 1 tablet every other day. The plan of care was discussed as noted above.  Bo Merino, MD  Note - This record has been created using Editor, commissioning.  Chart creation errors have been sought, but may not always  have been located. Such creation errors do not reflect on  the standard of medical care.

## 2016-11-01 DIAGNOSIS — N183 Chronic kidney disease, stage 3 (moderate): Secondary | ICD-10-CM | POA: Diagnosis not present

## 2016-11-01 DIAGNOSIS — M069 Rheumatoid arthritis, unspecified: Secondary | ICD-10-CM | POA: Diagnosis not present

## 2016-11-01 DIAGNOSIS — N400 Enlarged prostate without lower urinary tract symptoms: Secondary | ICD-10-CM | POA: Diagnosis not present

## 2016-11-01 DIAGNOSIS — J42 Unspecified chronic bronchitis: Secondary | ICD-10-CM | POA: Diagnosis not present

## 2016-11-01 DIAGNOSIS — I129 Hypertensive chronic kidney disease with stage 1 through stage 4 chronic kidney disease, or unspecified chronic kidney disease: Secondary | ICD-10-CM | POA: Diagnosis not present

## 2016-11-02 DIAGNOSIS — F4321 Adjustment disorder with depressed mood: Secondary | ICD-10-CM | POA: Diagnosis not present

## 2016-11-09 DIAGNOSIS — F4321 Adjustment disorder with depressed mood: Secondary | ICD-10-CM | POA: Diagnosis not present

## 2016-11-12 DIAGNOSIS — N183 Chronic kidney disease, stage 3 (moderate): Secondary | ICD-10-CM | POA: Diagnosis not present

## 2016-11-12 DIAGNOSIS — I129 Hypertensive chronic kidney disease with stage 1 through stage 4 chronic kidney disease, or unspecified chronic kidney disease: Secondary | ICD-10-CM | POA: Diagnosis not present

## 2016-11-12 DIAGNOSIS — E7800 Pure hypercholesterolemia, unspecified: Secondary | ICD-10-CM | POA: Diagnosis not present

## 2016-11-12 DIAGNOSIS — Z0000 Encounter for general adult medical examination without abnormal findings: Secondary | ICD-10-CM | POA: Diagnosis not present

## 2016-11-12 DIAGNOSIS — Z1389 Encounter for screening for other disorder: Secondary | ICD-10-CM | POA: Diagnosis not present

## 2016-11-12 DIAGNOSIS — F5101 Primary insomnia: Secondary | ICD-10-CM | POA: Diagnosis not present

## 2016-11-12 DIAGNOSIS — J309 Allergic rhinitis, unspecified: Secondary | ICD-10-CM | POA: Diagnosis not present

## 2016-11-13 ENCOUNTER — Encounter: Payer: Self-pay | Admitting: Rheumatology

## 2016-11-13 ENCOUNTER — Ambulatory Visit (INDEPENDENT_AMBULATORY_CARE_PROVIDER_SITE_OTHER): Payer: Medicare Other | Admitting: Rheumatology

## 2016-11-13 VITALS — BP 101/55 | HR 97 | Resp 18 | Ht 77.0 in | Wt 201.0 lb

## 2016-11-13 DIAGNOSIS — Z79899 Other long term (current) drug therapy: Secondary | ICD-10-CM | POA: Diagnosis not present

## 2016-11-13 DIAGNOSIS — G834 Cauda equina syndrome: Secondary | ICD-10-CM | POA: Diagnosis not present

## 2016-11-13 DIAGNOSIS — M19072 Primary osteoarthritis, left ankle and foot: Secondary | ICD-10-CM

## 2016-11-13 DIAGNOSIS — M5030 Other cervical disc degeneration, unspecified cervical region: Secondary | ICD-10-CM | POA: Diagnosis not present

## 2016-11-13 DIAGNOSIS — Z8679 Personal history of other diseases of the circulatory system: Secondary | ICD-10-CM

## 2016-11-13 DIAGNOSIS — Z96651 Presence of right artificial knee joint: Secondary | ICD-10-CM

## 2016-11-13 DIAGNOSIS — M19071 Primary osteoarthritis, right ankle and foot: Secondary | ICD-10-CM | POA: Diagnosis not present

## 2016-11-13 DIAGNOSIS — M19042 Primary osteoarthritis, left hand: Secondary | ICD-10-CM

## 2016-11-13 DIAGNOSIS — M170 Bilateral primary osteoarthritis of knee: Secondary | ICD-10-CM | POA: Diagnosis not present

## 2016-11-13 DIAGNOSIS — M0609 Rheumatoid arthritis without rheumatoid factor, multiple sites: Secondary | ICD-10-CM | POA: Diagnosis not present

## 2016-11-13 DIAGNOSIS — M5136 Other intervertebral disc degeneration, lumbar region: Secondary | ICD-10-CM

## 2016-11-13 DIAGNOSIS — Z8739 Personal history of other diseases of the musculoskeletal system and connective tissue: Secondary | ICD-10-CM | POA: Diagnosis not present

## 2016-11-13 DIAGNOSIS — Z8719 Personal history of other diseases of the digestive system: Secondary | ICD-10-CM

## 2016-11-13 DIAGNOSIS — R944 Abnormal results of kidney function studies: Secondary | ICD-10-CM

## 2016-11-13 DIAGNOSIS — Z87448 Personal history of other diseases of urinary system: Secondary | ICD-10-CM

## 2016-11-13 DIAGNOSIS — M19041 Primary osteoarthritis, right hand: Secondary | ICD-10-CM | POA: Diagnosis not present

## 2016-11-13 DIAGNOSIS — Z8639 Personal history of other endocrine, nutritional and metabolic disease: Secondary | ICD-10-CM | POA: Diagnosis not present

## 2016-11-13 DIAGNOSIS — Z87438 Personal history of other diseases of male genital organs: Secondary | ICD-10-CM

## 2016-11-13 NOTE — Telephone Encounter (Signed)
Provider portion has been completed and faxed to [facility] for patient assistance. Will update once we receive a response.   Will send document to scan center.   Margeret Stachnik, [place], CPhT 9:51 AM

## 2016-11-15 LAB — CBC WITH DIFFERENTIAL/PLATELET
BASOS PCT: 0.2 %
Basophils Absolute: 18 cells/uL (ref 0–200)
EOS ABS: 191 {cells}/uL (ref 15–500)
Eosinophils Relative: 2.1 %
HCT: 34.9 % — ABNORMAL LOW (ref 38.5–50.0)
HEMOGLOBIN: 12.2 g/dL — AB (ref 13.2–17.1)
LYMPHS ABS: 555 {cells}/uL — AB (ref 850–3900)
MCH: 33 pg (ref 27.0–33.0)
MCHC: 35 g/dL (ref 32.0–36.0)
MCV: 94.3 fL (ref 80.0–100.0)
MPV: 9.7 fL (ref 7.5–12.5)
Monocytes Relative: 9.7 %
NEUTROS ABS: 7453 {cells}/uL (ref 1500–7800)
Neutrophils Relative %: 81.9 %
Platelets: 218 10*3/uL (ref 140–400)
RBC: 3.7 10*6/uL — ABNORMAL LOW (ref 4.20–5.80)
RDW: 13.6 % (ref 11.0–15.0)
Total Lymphocyte: 6.1 %
WBC: 9.1 10*3/uL (ref 3.8–10.8)
WBCMIX: 883 {cells}/uL (ref 200–950)

## 2016-11-15 LAB — COMPLETE METABOLIC PANEL WITH GFR
AG RATIO: 1 (calc) (ref 1.0–2.5)
ALT: 17 U/L (ref 9–46)
AST: 15 U/L (ref 10–35)
Albumin: 3.4 g/dL — ABNORMAL LOW (ref 3.6–5.1)
Alkaline phosphatase (APISO): 64 U/L (ref 40–115)
BILIRUBIN TOTAL: 0.3 mg/dL (ref 0.2–1.2)
BUN / CREAT RATIO: 16 (calc) (ref 6–22)
BUN: 25 mg/dL (ref 7–25)
CHLORIDE: 103 mmol/L (ref 98–110)
CO2: 26 mmol/L (ref 20–32)
Calcium: 8.4 mg/dL — ABNORMAL LOW (ref 8.6–10.3)
Creat: 1.56 mg/dL — ABNORMAL HIGH (ref 0.70–1.11)
GFR, EST AFRICAN AMERICAN: 47 mL/min/{1.73_m2} — AB (ref >=60)
GFR, Est Non African American: 40 mL/min/{1.73_m2} — ABNORMAL LOW (ref >=60)
GLOBULIN: 3.3 g/dL (ref 1.9–3.7)
Glucose, Bld: 85 mg/dL (ref 65–99)
POTASSIUM: 3.5 mmol/L (ref 3.5–5.3)
SODIUM: 137 mmol/L (ref 135–146)
TOTAL PROTEIN: 6.7 g/dL (ref 6.1–8.1)

## 2016-11-15 LAB — QUANTIFERON TB GOLD ASSAY (BLOOD)
MITOGEN-NIL SO: 2.43 [IU]/mL
QUANTIFERON NIL VALUE: 0.07 [IU]/mL
QUANTIFERON TB AG MINUS NIL: 0.03 [IU]/mL
QUANTIFERON(R)-TB GOLD: NEGATIVE

## 2016-11-15 NOTE — Progress Notes (Signed)
stable °

## 2016-11-16 DIAGNOSIS — F4321 Adjustment disorder with depressed mood: Secondary | ICD-10-CM | POA: Diagnosis not present

## 2016-11-19 DIAGNOSIS — J322 Chronic ethmoidal sinusitis: Secondary | ICD-10-CM | POA: Diagnosis not present

## 2016-11-30 DIAGNOSIS — F4321 Adjustment disorder with depressed mood: Secondary | ICD-10-CM | POA: Diagnosis not present

## 2016-12-05 DIAGNOSIS — R8271 Bacteriuria: Secondary | ICD-10-CM | POA: Diagnosis not present

## 2016-12-05 DIAGNOSIS — N411 Chronic prostatitis: Secondary | ICD-10-CM | POA: Diagnosis not present

## 2016-12-14 DIAGNOSIS — F4321 Adjustment disorder with depressed mood: Secondary | ICD-10-CM | POA: Diagnosis not present

## 2016-12-17 ENCOUNTER — Telehealth (INDEPENDENT_AMBULATORY_CARE_PROVIDER_SITE_OTHER): Payer: Self-pay | Admitting: Rheumatology

## 2016-12-17 MED ORDER — TOFACITINIB CITRATE 5 MG PO TABS: 1.0000 | ORAL_TABLET | Freq: Every day | ORAL | 0 refills | Status: DC

## 2016-12-17 NOTE — Telephone Encounter (Signed)
Patient called requesting a RX refill on his [name].  He also stated that the stiffness in his hands has return and needs to go up on his medication.Patient is currently on 5 mg once daily. Patient has had elevated Creat. And decreased GFR. Last Creat is 1.56, GFR 40. Please advise.

## 2016-12-17 NOTE — Telephone Encounter (Addendum)
Attempted to contact the patient and left message for patient to call the office.  

## 2016-12-17 NOTE — Telephone Encounter (Signed)
Based on his GFR the dose of Jose Meza is 5 mg once a day. A higher dose will cause toxicity due to kidney filtration. Okay to refill the medication

## 2016-12-17 NOTE — Telephone Encounter (Signed)
Patient advised unable to go up on medication due to kidney function and the risk of toxicity. Patient verbalized understanding and refill sent to pharmacy.

## 2016-12-17 NOTE — Telephone Encounter (Signed)
Patient called requesting a RX refill on his [name].  He also stated that the stiffness in his hands has return and needs to go up on his medication.  He stated that he is almost out and would like it ASAP. CB#[phone]  Thank you.

## 2016-12-18 DIAGNOSIS — Z23 Encounter for immunization: Secondary | ICD-10-CM | POA: Diagnosis not present

## 2016-12-21 DIAGNOSIS — F4321 Adjustment disorder with depressed mood: Secondary | ICD-10-CM | POA: Diagnosis not present

## 2016-12-28 DIAGNOSIS — F4321 Adjustment disorder with depressed mood: Secondary | ICD-10-CM | POA: Diagnosis not present

## 2017-01-04 DIAGNOSIS — F4321 Adjustment disorder with depressed mood: Secondary | ICD-10-CM | POA: Diagnosis not present

## 2017-01-11 DIAGNOSIS — N183 Chronic kidney disease, stage 3 (moderate): Secondary | ICD-10-CM | POA: Diagnosis not present

## 2017-01-11 DIAGNOSIS — N400 Enlarged prostate without lower urinary tract symptoms: Secondary | ICD-10-CM | POA: Diagnosis not present

## 2017-01-11 DIAGNOSIS — I129 Hypertensive chronic kidney disease with stage 1 through stage 4 chronic kidney disease, or unspecified chronic kidney disease: Secondary | ICD-10-CM | POA: Diagnosis not present

## 2017-01-11 DIAGNOSIS — J42 Unspecified chronic bronchitis: Secondary | ICD-10-CM | POA: Diagnosis not present

## 2017-01-11 DIAGNOSIS — M069 Rheumatoid arthritis, unspecified: Secondary | ICD-10-CM | POA: Diagnosis not present

## 2017-01-18 ENCOUNTER — Ambulatory Visit
Admission: RE | Admit: 2017-01-18 | Discharge: 2017-01-18 | Disposition: A | Discharge status: Home / Self Care | Payer: Medicare Other | Source: Ambulatory Visit | Attending: Internal Medicine | Admitting: Internal Medicine

## 2017-01-18 ENCOUNTER — Other Ambulatory Visit: Payer: Self-pay | Admitting: Internal Medicine

## 2017-01-18 DIAGNOSIS — R05 Cough: Secondary | ICD-10-CM | POA: Diagnosis not present

## 2017-01-18 DIAGNOSIS — R69 Illness, unspecified: Secondary | ICD-10-CM | POA: Diagnosis not present

## 2017-01-18 DIAGNOSIS — J42 Unspecified chronic bronchitis: Secondary | ICD-10-CM

## 2017-01-24 DIAGNOSIS — R3914 Feeling of incomplete bladder emptying: Secondary | ICD-10-CM | POA: Diagnosis not present

## 2017-01-24 DIAGNOSIS — R8279 Other abnormal findings on microbiological examination of urine: Secondary | ICD-10-CM | POA: Diagnosis not present

## 2017-01-24 DIAGNOSIS — N411 Chronic prostatitis: Secondary | ICD-10-CM | POA: Diagnosis not present

## 2017-01-28 DIAGNOSIS — R0981 Nasal congestion: Secondary | ICD-10-CM | POA: Diagnosis not present

## 2017-01-28 DIAGNOSIS — J309 Allergic rhinitis, unspecified: Secondary | ICD-10-CM | POA: Diagnosis not present

## 2017-01-28 DIAGNOSIS — R269 Unspecified abnormalities of gait and mobility: Secondary | ICD-10-CM | POA: Diagnosis not present

## 2017-01-28 DIAGNOSIS — N183 Chronic kidney disease, stage 3 (moderate): Secondary | ICD-10-CM | POA: Diagnosis not present

## 2017-01-28 DIAGNOSIS — I129 Hypertensive chronic kidney disease with stage 1 through stage 4 chronic kidney disease, or unspecified chronic kidney disease: Secondary | ICD-10-CM | POA: Diagnosis not present

## 2017-01-29 DIAGNOSIS — N411 Chronic prostatitis: Secondary | ICD-10-CM | POA: Diagnosis not present

## 2017-01-29 DIAGNOSIS — N400 Enlarged prostate without lower urinary tract symptoms: Secondary | ICD-10-CM | POA: Diagnosis not present

## 2017-01-29 DIAGNOSIS — R3914 Feeling of incomplete bladder emptying: Secondary | ICD-10-CM | POA: Diagnosis not present

## 2017-02-04 DIAGNOSIS — J42 Unspecified chronic bronchitis: Secondary | ICD-10-CM | POA: Diagnosis not present

## 2017-02-04 DIAGNOSIS — M069 Rheumatoid arthritis, unspecified: Secondary | ICD-10-CM | POA: Diagnosis not present

## 2017-02-04 DIAGNOSIS — N183 Chronic kidney disease, stage 3 (moderate): Secondary | ICD-10-CM | POA: Diagnosis not present

## 2017-02-04 DIAGNOSIS — G834 Cauda equina syndrome: Secondary | ICD-10-CM | POA: Diagnosis not present

## 2017-02-04 DIAGNOSIS — I129 Hypertensive chronic kidney disease with stage 1 through stage 4 chronic kidney disease, or unspecified chronic kidney disease: Secondary | ICD-10-CM | POA: Diagnosis not present

## 2017-02-04 DIAGNOSIS — J322 Chronic ethmoidal sinusitis: Secondary | ICD-10-CM | POA: Diagnosis not present

## 2017-02-05 DIAGNOSIS — J342 Deviated nasal septum: Secondary | ICD-10-CM | POA: Diagnosis not present

## 2017-02-05 DIAGNOSIS — J343 Hypertrophy of nasal turbinates: Secondary | ICD-10-CM | POA: Diagnosis not present

## 2017-02-05 DIAGNOSIS — J310 Chronic rhinitis: Secondary | ICD-10-CM | POA: Diagnosis not present

## 2017-02-06 DIAGNOSIS — G834 Cauda equina syndrome: Secondary | ICD-10-CM | POA: Diagnosis not present

## 2017-02-06 DIAGNOSIS — J42 Unspecified chronic bronchitis: Secondary | ICD-10-CM | POA: Diagnosis not present

## 2017-02-06 DIAGNOSIS — N183 Chronic kidney disease, stage 3 (moderate): Secondary | ICD-10-CM | POA: Diagnosis not present

## 2017-02-06 DIAGNOSIS — M069 Rheumatoid arthritis, unspecified: Secondary | ICD-10-CM | POA: Diagnosis not present

## 2017-02-06 DIAGNOSIS — J322 Chronic ethmoidal sinusitis: Secondary | ICD-10-CM | POA: Diagnosis not present

## 2017-02-06 DIAGNOSIS — I129 Hypertensive chronic kidney disease with stage 1 through stage 4 chronic kidney disease, or unspecified chronic kidney disease: Secondary | ICD-10-CM | POA: Diagnosis not present

## 2017-02-08 DIAGNOSIS — J309 Allergic rhinitis, unspecified: Secondary | ICD-10-CM | POA: Diagnosis not present

## 2017-02-08 DIAGNOSIS — R0602 Shortness of breath: Secondary | ICD-10-CM | POA: Diagnosis not present

## 2017-02-08 DIAGNOSIS — H1045 Other chronic allergic conjunctivitis: Secondary | ICD-10-CM | POA: Diagnosis not present

## 2017-02-08 DIAGNOSIS — R69 Illness, unspecified: Secondary | ICD-10-CM | POA: Diagnosis not present

## 2017-02-09 DIAGNOSIS — J322 Chronic ethmoidal sinusitis: Secondary | ICD-10-CM | POA: Diagnosis not present

## 2017-02-09 DIAGNOSIS — G834 Cauda equina syndrome: Secondary | ICD-10-CM | POA: Diagnosis not present

## 2017-02-09 DIAGNOSIS — I129 Hypertensive chronic kidney disease with stage 1 through stage 4 chronic kidney disease, or unspecified chronic kidney disease: Secondary | ICD-10-CM | POA: Diagnosis not present

## 2017-02-09 DIAGNOSIS — N183 Chronic kidney disease, stage 3 (moderate): Secondary | ICD-10-CM | POA: Diagnosis not present

## 2017-02-09 DIAGNOSIS — J42 Unspecified chronic bronchitis: Secondary | ICD-10-CM | POA: Diagnosis not present

## 2017-02-09 DIAGNOSIS — M069 Rheumatoid arthritis, unspecified: Secondary | ICD-10-CM | POA: Diagnosis not present

## 2017-02-11 DIAGNOSIS — R0981 Nasal congestion: Secondary | ICD-10-CM | POA: Diagnosis not present

## 2017-02-11 DIAGNOSIS — N183 Chronic kidney disease, stage 3 (moderate): Secondary | ICD-10-CM | POA: Diagnosis not present

## 2017-02-11 DIAGNOSIS — I129 Hypertensive chronic kidney disease with stage 1 through stage 4 chronic kidney disease, or unspecified chronic kidney disease: Secondary | ICD-10-CM | POA: Diagnosis not present

## 2017-02-12 ENCOUNTER — Telehealth: Payer: Self-pay | Admitting: Rheumatology

## 2017-02-12 DIAGNOSIS — G834 Cauda equina syndrome: Secondary | ICD-10-CM | POA: Diagnosis not present

## 2017-02-12 DIAGNOSIS — I129 Hypertensive chronic kidney disease with stage 1 through stage 4 chronic kidney disease, or unspecified chronic kidney disease: Secondary | ICD-10-CM | POA: Diagnosis not present

## 2017-02-12 DIAGNOSIS — N183 Chronic kidney disease, stage 3 (moderate): Secondary | ICD-10-CM | POA: Diagnosis not present

## 2017-02-12 DIAGNOSIS — M069 Rheumatoid arthritis, unspecified: Secondary | ICD-10-CM | POA: Diagnosis not present

## 2017-02-12 DIAGNOSIS — J322 Chronic ethmoidal sinusitis: Secondary | ICD-10-CM | POA: Diagnosis not present

## 2017-02-12 DIAGNOSIS — J42 Unspecified chronic bronchitis: Secondary | ICD-10-CM | POA: Diagnosis not present

## 2017-02-12 NOTE — Telephone Encounter (Signed)
Patient left a voicemail stating that he is having a problem with the renewal of his prescription for [name].  Patient's CB# [phone]

## 2017-02-13 DIAGNOSIS — J322 Chronic ethmoidal sinusitis: Secondary | ICD-10-CM | POA: Diagnosis not present

## 2017-02-13 DIAGNOSIS — N183 Chronic kidney disease, stage 3 (moderate): Secondary | ICD-10-CM | POA: Diagnosis not present

## 2017-02-13 DIAGNOSIS — J42 Unspecified chronic bronchitis: Secondary | ICD-10-CM | POA: Diagnosis not present

## 2017-02-13 DIAGNOSIS — I129 Hypertensive chronic kidney disease with stage 1 through stage 4 chronic kidney disease, or unspecified chronic kidney disease: Secondary | ICD-10-CM | POA: Diagnosis not present

## 2017-02-13 DIAGNOSIS — M069 Rheumatoid arthritis, unspecified: Secondary | ICD-10-CM | POA: Diagnosis not present

## 2017-02-13 DIAGNOSIS — G834 Cauda equina syndrome: Secondary | ICD-10-CM | POA: Diagnosis not present

## 2017-02-13 MED ORDER — TOFACITINIB CITRATE 5 MG PO TABS: 1.0000 | ORAL_TABLET | Freq: Every day | ORAL | 0 refills | Status: DC

## 2017-02-13 NOTE — Telephone Encounter (Signed)
Last Visit: 11/13/16 Next Visit: 04/11/17 Labs: 11/13/16 Stable  Left message to advise patient he is due   Okay to refill 30 day supply per Dr. Estanislado Pandy

## 2017-02-15 DIAGNOSIS — R69 Illness, unspecified: Secondary | ICD-10-CM | POA: Diagnosis not present

## 2017-02-20 DIAGNOSIS — J322 Chronic ethmoidal sinusitis: Secondary | ICD-10-CM | POA: Diagnosis not present

## 2017-02-20 DIAGNOSIS — J42 Unspecified chronic bronchitis: Secondary | ICD-10-CM | POA: Diagnosis not present

## 2017-02-20 DIAGNOSIS — M069 Rheumatoid arthritis, unspecified: Secondary | ICD-10-CM | POA: Diagnosis not present

## 2017-02-20 DIAGNOSIS — I129 Hypertensive chronic kidney disease with stage 1 through stage 4 chronic kidney disease, or unspecified chronic kidney disease: Secondary | ICD-10-CM | POA: Diagnosis not present

## 2017-02-20 DIAGNOSIS — N183 Chronic kidney disease, stage 3 (moderate): Secondary | ICD-10-CM | POA: Diagnosis not present

## 2017-02-20 DIAGNOSIS — G834 Cauda equina syndrome: Secondary | ICD-10-CM | POA: Diagnosis not present

## 2017-02-22 DIAGNOSIS — J42 Unspecified chronic bronchitis: Secondary | ICD-10-CM | POA: Diagnosis not present

## 2017-02-22 DIAGNOSIS — N183 Chronic kidney disease, stage 3 (moderate): Secondary | ICD-10-CM | POA: Diagnosis not present

## 2017-02-22 DIAGNOSIS — M069 Rheumatoid arthritis, unspecified: Secondary | ICD-10-CM | POA: Diagnosis not present

## 2017-02-22 DIAGNOSIS — I129 Hypertensive chronic kidney disease with stage 1 through stage 4 chronic kidney disease, or unspecified chronic kidney disease: Secondary | ICD-10-CM | POA: Diagnosis not present

## 2017-02-22 DIAGNOSIS — G834 Cauda equina syndrome: Secondary | ICD-10-CM | POA: Diagnosis not present

## 2017-02-22 DIAGNOSIS — J322 Chronic ethmoidal sinusitis: Secondary | ICD-10-CM | POA: Diagnosis not present

## 2017-02-26 ENCOUNTER — Telehealth: Payer: Self-pay | Admitting: Rheumatology

## 2017-02-26 NOTE — Telephone Encounter (Signed)
Spoke with Verdis Frederickson and advised that she should take him to PCP.  She states he has already been there and they have done nothing other than given him prednisone which has not helped. She states that the patient is not eating and she is worried about him getting dehydrated. Instructed her to take him to the emergency room. Verdis Frederickson verbalized understanding.

## 2017-02-26 NOTE — Telephone Encounter (Signed)
Jose Meza called stating that Jose Meza is having chills, weakness, low grade fever, cramps in hands, and not able to eat.  Jose Meza states that he also has a lot of congestion and "his immune systems seems to be crashing."  Patient was prescribed Prednisone which he took for a week by his PCP. Jose Meza is very worried and doesn't know if she should schedule an appointment.

## 2017-02-27 ENCOUNTER — Inpatient Hospital Stay (HOSPITAL_BASED_OUTPATIENT_CLINIC_OR_DEPARTMENT_OTHER)
Admission: EM | Admit: 2017-02-27 | Discharge: 2017-03-14 | DRG: 823 | Disposition: A | Discharge status: Skilled Nursing Facility | Payer: Medicare HMO | Attending: Family Medicine | Admitting: Family Medicine

## 2017-02-27 ENCOUNTER — Other Ambulatory Visit: Payer: Self-pay

## 2017-02-27 ENCOUNTER — Emergency Department (HOSPITAL_BASED_OUTPATIENT_CLINIC_OR_DEPARTMENT_OTHER): Payer: Medicare HMO

## 2017-02-27 ENCOUNTER — Encounter (HOSPITAL_BASED_OUTPATIENT_CLINIC_OR_DEPARTMENT_OTHER): Payer: Self-pay

## 2017-02-27 ENCOUNTER — Other Ambulatory Visit: Payer: Self-pay | Admitting: Otolaryngology

## 2017-02-27 DIAGNOSIS — M19042 Primary osteoarthritis, left hand: Secondary | ICD-10-CM

## 2017-02-27 DIAGNOSIS — J342 Deviated nasal septum: Secondary | ICD-10-CM | POA: Diagnosis present

## 2017-02-27 DIAGNOSIS — E782 Mixed hyperlipidemia: Secondary | ICD-10-CM | POA: Diagnosis not present

## 2017-02-27 DIAGNOSIS — N179 Acute kidney failure, unspecified: Secondary | ICD-10-CM | POA: Diagnosis present

## 2017-02-27 DIAGNOSIS — R21 Rash and other nonspecific skin eruption: Secondary | ICD-10-CM | POA: Diagnosis present

## 2017-02-27 DIAGNOSIS — Z96651 Presence of right artificial knee joint: Secondary | ICD-10-CM

## 2017-02-27 DIAGNOSIS — Z7952 Long term (current) use of systemic steroids: Secondary | ICD-10-CM

## 2017-02-27 DIAGNOSIS — Z6823 Body mass index (BMI) 23.0-23.9, adult: Secondary | ICD-10-CM

## 2017-02-27 DIAGNOSIS — R590 Localized enlarged lymph nodes: Secondary | ICD-10-CM | POA: Diagnosis not present

## 2017-02-27 DIAGNOSIS — Z5111 Encounter for antineoplastic chemotherapy: Secondary | ICD-10-CM

## 2017-02-27 DIAGNOSIS — Z6822 Body mass index (BMI) 22.0-22.9, adult: Secondary | ICD-10-CM

## 2017-02-27 DIAGNOSIS — M19072 Primary osteoarthritis, left ankle and foot: Secondary | ICD-10-CM

## 2017-02-27 DIAGNOSIS — J181 Lobar pneumonia, unspecified organism: Secondary | ICD-10-CM

## 2017-02-27 DIAGNOSIS — D899 Disorder involving the immune mechanism, unspecified: Secondary | ICD-10-CM | POA: Diagnosis present

## 2017-02-27 DIAGNOSIS — M1990 Unspecified osteoarthritis, unspecified site: Secondary | ICD-10-CM

## 2017-02-27 DIAGNOSIS — R0902 Hypoxemia: Secondary | ICD-10-CM

## 2017-02-27 DIAGNOSIS — J189 Pneumonia, unspecified organism: Secondary | ICD-10-CM

## 2017-02-27 DIAGNOSIS — R5383 Other fatigue: Secondary | ICD-10-CM | POA: Diagnosis not present

## 2017-02-27 DIAGNOSIS — I70209 Unspecified atherosclerosis of native arteries of extremities, unspecified extremity: Secondary | ICD-10-CM | POA: Diagnosis present

## 2017-02-27 DIAGNOSIS — R600 Localized edema: Secondary | ICD-10-CM | POA: Diagnosis not present

## 2017-02-27 DIAGNOSIS — J343 Hypertrophy of nasal turbinates: Secondary | ICD-10-CM | POA: Diagnosis not present

## 2017-02-27 DIAGNOSIS — R509 Fever, unspecified: Secondary | ICD-10-CM | POA: Diagnosis present

## 2017-02-27 DIAGNOSIS — Z882 Allergy status to sulfonamides status: Secondary | ICD-10-CM

## 2017-02-27 DIAGNOSIS — K8020 Calculus of gallbladder without cholecystitis without obstruction: Secondary | ICD-10-CM | POA: Diagnosis not present

## 2017-02-27 DIAGNOSIS — L299 Pruritus, unspecified: Secondary | ICD-10-CM | POA: Diagnosis not present

## 2017-02-27 DIAGNOSIS — M0609 Rheumatoid arthritis without rheumatoid factor, multiple sites: Secondary | ICD-10-CM | POA: Diagnosis not present

## 2017-02-27 DIAGNOSIS — R109 Unspecified abdominal pain: Secondary | ICD-10-CM | POA: Diagnosis not present

## 2017-02-27 DIAGNOSIS — E43 Unspecified severe protein-calorie malnutrition: Secondary | ICD-10-CM | POA: Diagnosis not present

## 2017-02-27 DIAGNOSIS — I82613 Acute embolism and thrombosis of superficial veins of upper extremity, bilateral: Secondary | ICD-10-CM | POA: Diagnosis present

## 2017-02-27 DIAGNOSIS — N183 Chronic kidney disease, stage 3 unspecified: Secondary | ICD-10-CM | POA: Diagnosis present

## 2017-02-27 DIAGNOSIS — E785 Hyperlipidemia, unspecified: Secondary | ICD-10-CM | POA: Diagnosis present

## 2017-02-27 DIAGNOSIS — N390 Urinary tract infection, site not specified: Secondary | ICD-10-CM | POA: Diagnosis present

## 2017-02-27 DIAGNOSIS — R0981 Nasal congestion: Secondary | ICD-10-CM | POA: Diagnosis not present

## 2017-02-27 DIAGNOSIS — J9601 Acute respiratory failure with hypoxia: Secondary | ICD-10-CM | POA: Diagnosis present

## 2017-02-27 DIAGNOSIS — E860 Dehydration: Secondary | ICD-10-CM | POA: Diagnosis present

## 2017-02-27 DIAGNOSIS — R591 Generalized enlarged lymph nodes: Secondary | ICD-10-CM | POA: Diagnosis not present

## 2017-02-27 DIAGNOSIS — T380X5A Adverse effect of glucocorticoids and synthetic analogues, initial encounter: Secondary | ICD-10-CM | POA: Diagnosis present

## 2017-02-27 DIAGNOSIS — B961 Klebsiella pneumoniae [K. pneumoniae] as the cause of diseases classified elsewhere: Secondary | ICD-10-CM | POA: Diagnosis present

## 2017-02-27 DIAGNOSIS — R918 Other nonspecific abnormal finding of lung field: Secondary | ICD-10-CM | POA: Diagnosis present

## 2017-02-27 DIAGNOSIS — R0602 Shortness of breath: Secondary | ICD-10-CM | POA: Diagnosis not present

## 2017-02-27 DIAGNOSIS — I129 Hypertensive chronic kidney disease with stage 1 through stage 4 chronic kidney disease, or unspecified chronic kidney disease: Secondary | ICD-10-CM | POA: Diagnosis present

## 2017-02-27 DIAGNOSIS — I959 Hypotension, unspecified: Secondary | ICD-10-CM | POA: Diagnosis not present

## 2017-02-27 DIAGNOSIS — Z4659 Encounter for fitting and adjustment of other gastrointestinal appliance and device: Secondary | ICD-10-CM

## 2017-02-27 DIAGNOSIS — Z8719 Personal history of other diseases of the digestive system: Secondary | ICD-10-CM

## 2017-02-27 DIAGNOSIS — R634 Abnormal weight loss: Secondary | ICD-10-CM | POA: Diagnosis present

## 2017-02-27 DIAGNOSIS — E883 Tumor lysis syndrome: Secondary | ICD-10-CM | POA: Diagnosis not present

## 2017-02-27 DIAGNOSIS — C8440 Peripheral T-cell lymphoma, not classified, unspecified site: Secondary | ICD-10-CM | POA: Diagnosis not present

## 2017-02-27 DIAGNOSIS — R140 Abdominal distension (gaseous): Secondary | ICD-10-CM | POA: Diagnosis not present

## 2017-02-27 DIAGNOSIS — E0781 Sick-euthyroid syndrome: Secondary | ICD-10-CM | POA: Diagnosis present

## 2017-02-27 DIAGNOSIS — I700 Atherosclerosis of aorta: Secondary | ICD-10-CM | POA: Diagnosis not present

## 2017-02-27 DIAGNOSIS — R000 Tachycardia, unspecified: Secondary | ICD-10-CM | POA: Diagnosis present

## 2017-02-27 DIAGNOSIS — I10 Essential (primary) hypertension: Secondary | ICD-10-CM | POA: Diagnosis not present

## 2017-02-27 DIAGNOSIS — C8590 Non-Hodgkin lymphoma, unspecified, unspecified site: Secondary | ICD-10-CM

## 2017-02-27 DIAGNOSIS — E7800 Pure hypercholesterolemia, unspecified: Secondary | ICD-10-CM

## 2017-02-27 DIAGNOSIS — C8330 Diffuse large B-cell lymphoma, unspecified site: Secondary | ICD-10-CM

## 2017-02-27 DIAGNOSIS — D649 Anemia, unspecified: Secondary | ICD-10-CM | POA: Diagnosis present

## 2017-02-27 DIAGNOSIS — R319 Hematuria, unspecified: Secondary | ICD-10-CM | POA: Diagnosis present

## 2017-02-27 DIAGNOSIS — F419 Anxiety disorder, unspecified: Secondary | ICD-10-CM | POA: Diagnosis present

## 2017-02-27 DIAGNOSIS — R05 Cough: Secondary | ICD-10-CM | POA: Diagnosis not present

## 2017-02-27 DIAGNOSIS — Z978 Presence of other specified devices: Secondary | ICD-10-CM | POA: Diagnosis not present

## 2017-02-27 DIAGNOSIS — E46 Unspecified protein-calorie malnutrition: Secondary | ICD-10-CM

## 2017-02-27 DIAGNOSIS — Z87891 Personal history of nicotine dependence: Secondary | ICD-10-CM

## 2017-02-27 DIAGNOSIS — M19041 Primary osteoarthritis, right hand: Secondary | ICD-10-CM

## 2017-02-27 DIAGNOSIS — I77810 Thoracic aortic ectasia: Secondary | ICD-10-CM | POA: Diagnosis present

## 2017-02-27 DIAGNOSIS — M069 Rheumatoid arthritis, unspecified: Secondary | ICD-10-CM | POA: Diagnosis present

## 2017-02-27 DIAGNOSIS — K219 Gastro-esophageal reflux disease without esophagitis: Secondary | ICD-10-CM | POA: Diagnosis present

## 2017-02-27 DIAGNOSIS — Z7401 Bed confinement status: Secondary | ICD-10-CM

## 2017-02-27 DIAGNOSIS — C865 Angioimmunoblastic T-cell lymphoma: Principal | ICD-10-CM | POA: Diagnosis present

## 2017-02-27 DIAGNOSIS — Z95828 Presence of other vascular implants and grafts: Secondary | ICD-10-CM | POA: Diagnosis not present

## 2017-02-27 DIAGNOSIS — Z79899 Other long term (current) drug therapy: Secondary | ICD-10-CM

## 2017-02-27 DIAGNOSIS — R5381 Other malaise: Secondary | ICD-10-CM | POA: Diagnosis not present

## 2017-02-27 DIAGNOSIS — N400 Enlarged prostate without lower urinary tract symptoms: Secondary | ICD-10-CM | POA: Diagnosis not present

## 2017-02-27 DIAGNOSIS — E440 Moderate protein-calorie malnutrition: Secondary | ICD-10-CM | POA: Diagnosis not present

## 2017-02-27 DIAGNOSIS — M19071 Primary osteoarthritis, right ankle and foot: Secondary | ICD-10-CM

## 2017-02-27 DIAGNOSIS — I361 Nonrheumatic tricuspid (valve) insufficiency: Secondary | ICD-10-CM | POA: Diagnosis not present

## 2017-02-27 LAB — INFLUENZA PANEL BY PCR (TYPE A & B)
Influenza A By PCR: NEGATIVE
Influenza B By PCR: NEGATIVE

## 2017-02-27 LAB — CBC WITH DIFFERENTIAL/PLATELET
BASOS PCT: 1 %
Basophils Absolute: 0.1 10*3/uL (ref 0.0–0.1)
EOS PCT: 2 %
Eosinophils Absolute: 0.2 10*3/uL (ref 0.0–0.7)
HCT: 35.3 % — ABNORMAL LOW (ref 39.0–52.0)
Hemoglobin: 12.2 g/dL — ABNORMAL LOW (ref 13.0–17.0)
LYMPHS ABS: 0.8 10*3/uL (ref 0.7–4.0)
Lymphocytes Relative: 7 %
MCH: 33.4 pg (ref 26.0–34.0)
MCHC: 34.6 g/dL (ref 30.0–36.0)
MCV: 96.7 fL (ref 78.0–100.0)
Monocytes Absolute: 1.5 10*3/uL — ABNORMAL HIGH (ref 0.1–1.0)
Monocytes Relative: 13 %
NEUTROS ABS: 9 10*3/uL — AB (ref 1.7–7.7)
Neutrophils Relative %: 77 %
Platelets: 207 10*3/uL (ref 150–400)
RBC: 3.65 MIL/uL — ABNORMAL LOW (ref 4.22–5.81)
RDW: 16.1 % — AB (ref 11.5–15.5)
WBC: 11.6 10*3/uL — ABNORMAL HIGH (ref 4.0–10.5)

## 2017-02-27 LAB — URINALYSIS, ROUTINE W REFLEX MICROSCOPIC
Bilirubin Urine: NEGATIVE
GLUCOSE, UA: NEGATIVE mg/dL
KETONES UR: NEGATIVE mg/dL
Nitrite: POSITIVE — AB
PH: 6 (ref 5.0–8.0)
Protein, ur: NEGATIVE mg/dL
Specific Gravity, Urine: <1.005 — ABNORMAL LOW (ref 1.005–1.030)

## 2017-02-27 LAB — COMPREHENSIVE METABOLIC PANEL
ALT: 25 U/L (ref 17–63)
AST: 29 U/L (ref 15–41)
Albumin: 2.7 g/dL — ABNORMAL LOW (ref 3.5–5.0)
Alkaline Phosphatase: 59 U/L (ref 38–126)
Anion gap: 11 (ref 5–15)
BUN: 30 mg/dL — ABNORMAL HIGH (ref 6–20)
CHLORIDE: 99 mmol/L — AB (ref 101–111)
CO2: 19 mmol/L — ABNORMAL LOW (ref 22–32)
Calcium: 9 mg/dL (ref 8.9–10.3)
Creatinine, Ser: 1.72 mg/dL — ABNORMAL HIGH (ref 0.61–1.24)
GFR, EST AFRICAN AMERICAN: 41 mL/min — AB (ref >60)
GFR, EST NON AFRICAN AMERICAN: 35 mL/min — AB (ref >60)
Glucose, Bld: 130 mg/dL — ABNORMAL HIGH (ref 65–99)
POTASSIUM: 4.2 mmol/L (ref 3.5–5.1)
Sodium: 129 mmol/L — ABNORMAL LOW (ref 135–145)
TOTAL PROTEIN: 7.3 g/dL (ref 6.5–8.1)
Total Bilirubin: 0.4 mg/dL (ref 0.3–1.2)

## 2017-02-27 LAB — TSH: TSH: 6.452 u[IU]/mL — ABNORMAL HIGH (ref 0.350–4.500)

## 2017-02-27 LAB — I-STAT CG4 LACTIC ACID, ED: Lactic Acid, Venous: 1.29 mmol/L (ref 0.5–1.9)

## 2017-02-27 LAB — TROPONIN I

## 2017-02-27 LAB — URINALYSIS, MICROSCOPIC (REFLEX)

## 2017-02-27 LAB — LIPASE, BLOOD: LIPASE: 46 U/L (ref 11–51)

## 2017-02-27 MED ORDER — METOPROLOL SUCCINATE ER 50 MG PO TB24
100.0000 mg | ORAL_TABLET | Freq: Every day | ORAL | Status: DC
  Administered 2017-02-28 – 2017-03-01 (×2): 100 mg via ORAL
  Filled 2017-02-27 (×2): qty 2

## 2017-02-27 MED ORDER — ZOLPIDEM TARTRATE 5 MG PO TABS
5.0000 mg | ORAL_TABLET | Freq: Every evening | ORAL | Status: DC | PRN
  Administered 2017-02-27 – 2017-02-28 (×2): 5 mg via ORAL
  Filled 2017-02-27 (×2): qty 1

## 2017-02-27 MED ORDER — SODIUM CHLORIDE 0.9% FLUSH
3.0000 mL | Freq: Two times a day (BID) | INTRAVENOUS | Status: DC
  Administered 2017-02-27 – 2017-03-08 (×11): 3 mL via INTRAVENOUS
  Administered 2017-03-09: 10 mL via INTRAVENOUS
  Administered 2017-03-10 – 2017-03-13 (×5): 3 mL via INTRAVENOUS

## 2017-02-27 MED ORDER — DIPHENHYDRAMINE HCL 50 MG/ML IJ SOLN
25.0000 mg | Freq: Once | INTRAMUSCULAR | Status: AC
  Administered 2017-02-27: 25 mg via INTRAVENOUS

## 2017-02-27 MED ORDER — SODIUM CHLORIDE 0.9 % IV BOLUS (SEPSIS)
1000.0000 mL | Freq: Once | INTRAVENOUS | Status: AC
  Administered 2017-02-27: 1000 mL via INTRAVENOUS

## 2017-02-27 MED ORDER — SODIUM CHLORIDE 0.9 % IV SOLN
1.0000 g | INTRAVENOUS | Status: DC
  Administered 2017-02-28: 1 g via INTRAVENOUS
  Filled 2017-02-27: qty 1

## 2017-02-27 MED ORDER — AZITHROMYCIN 500 MG IV SOLR
INTRAVENOUS | Status: AC
Start: 2017-02-27 — End: 2017-02-28
  Filled 2017-02-27: qty 500

## 2017-02-27 MED ORDER — SODIUM CHLORIDE 0.9% FLUSH: 3.0000 mL | INTRAVENOUS | Status: DC | PRN

## 2017-02-27 MED ORDER — FAMOTIDINE IN NACL 20-0.9 MG/50ML-% IV SOLN
INTRAVENOUS | Status: AC
  Filled 2017-02-27: qty 50

## 2017-02-27 MED ORDER — SODIUM CHLORIDE 0.9 % IV SOLN
500.0000 mg | Freq: Once | INTRAVENOUS | Status: AC
  Administered 2017-02-27: 500 mg via INTRAVENOUS
  Filled 2017-02-27: qty 500

## 2017-02-27 MED ORDER — IOPAMIDOL (ISOVUE-370) INJECTION 76%
100.0000 mL | Freq: Once | INTRAVENOUS | Status: AC | PRN
  Administered 2017-02-27: 80 mL via INTRAVENOUS

## 2017-02-27 MED ORDER — LORAZEPAM 0.5 MG PO TABS
0.5000 mg | ORAL_TABLET | Freq: Two times a day (BID) | ORAL | Status: DC | PRN
Start: 2017-02-27 — End: 2017-03-03
  Administered 2017-02-27 – 2017-03-03 (×7): 0.5 mg via ORAL
  Filled 2017-02-27 (×7): qty 1

## 2017-02-27 MED ORDER — METHYLPREDNISOLONE SODIUM SUCC 125 MG IJ SOLR: 125.0000 mg | Freq: Once | INTRAMUSCULAR | Status: DC

## 2017-02-27 MED ORDER — ONDANSETRON HCL 4 MG/2ML IJ SOLN
4.0000 mg | Freq: Once | INTRAMUSCULAR | Status: AC
  Administered 2017-02-27: 4 mg via INTRAVENOUS
  Filled 2017-02-27: qty 2

## 2017-02-27 MED ORDER — FAMOTIDINE IN NACL 20-0.9 MG/50ML-% IV SOLN
20.0000 mg | Freq: Once | INTRAVENOUS | Status: AC
  Administered 2017-02-27: 20 mg via INTRAVENOUS

## 2017-02-27 MED ORDER — SODIUM CHLORIDE 0.9 % IV SOLN
500.0000 mg | INTRAVENOUS | Status: DC
  Administered 2017-02-28 – 2017-03-04 (×5): 500 mg via INTRAVENOUS
  Filled 2017-02-27 (×5): qty 500

## 2017-02-27 MED ORDER — DIPHENHYDRAMINE HCL 50 MG/ML IJ SOLN
INTRAMUSCULAR | Status: AC
  Filled 2017-02-27: qty 1

## 2017-02-27 MED ORDER — SODIUM CHLORIDE 0.9 % IV SOLN
250.0000 mL | INTRAVENOUS | Status: DC | PRN
  Administered 2017-03-04: 09:00:00 via INTRAVENOUS
  Filled 2017-02-27: qty 250

## 2017-02-27 MED ORDER — SODIUM CHLORIDE 0.9 % IV SOLN
1.0000 g | Freq: Once | INTRAVENOUS | Status: AC
  Administered 2017-02-27: 1 g via INTRAVENOUS
  Filled 2017-02-27: qty 10

## 2017-02-27 MED ORDER — ACETAMINOPHEN 500 MG PO TABS
1000.0000 mg | ORAL_TABLET | Freq: Once | ORAL | Status: AC
  Administered 2017-02-27: 1000 mg via ORAL
  Filled 2017-02-27: qty 2

## 2017-02-27 MED ORDER — METHYLPREDNISOLONE SODIUM SUCC 125 MG IJ SOLR
INTRAMUSCULAR | Status: AC
  Filled 2017-02-27: qty 2

## 2017-02-27 NOTE — ED Notes (Signed)
Refused Solumedrol due to being on [place] .

## 2017-02-27 NOTE — ED Provider Notes (Signed)
[facility]MENT Provider Note   CSN: [id number] Arrival date & time: 02/27/17  1442     History   Chief Complaint No chief complaint on file.   HPI Jose Meza is a 84 y.o. male history of CKD, rheumatoid arthritis on prednisone, previous prostatitis here presenting with persistent chills, weight loss, dehydration.  Patient has been not feeling well for the last several months.  He has intermittent chills and has seen primary care doctor multiple times.  He was diagnosed with prostatitis but finish multiple antibiotics with no relief.  He recently finished steroids for bronchitis with no improvement. About a month ago he has worsening nasal congestion and is scheduled for nose surgery in a week.  However, over the last several months, patient has poor appetite and lost about 20 pounds.  Patient also has more episodes of chills this week.  He has nonproductive cough as well.  Denies any vomiting or diarrhea.  He does have some subjective shortness of breath as well.  Patient did see rheumatology and had extensive testing done with no results currently.   The history is provided by the patient.    Past Medical History:  Diagnosis Date  . Allergic rhinitis, seasonal   . Arthritis    neg rheum eval  . BPH (benign prostatic hypertrophy)    recurrent prostatitis, hx BOO  . Cauda equina, syndrome   . Chronic kidney disease    stage 2-3  . DJD (degenerative joint disease) of knee    right knee, s/p knee replacement 12/06  . GERD (gastroesophageal reflux disease)   . Hyperlipidemia   . Hypertension   . IBS (irritable bowel syndrome)    constipation prone  . Incomplete RBBB    ECG 05/08/13  . Polyp of colon, adenomatous 07/2009   buccini  . Rheumatoid arthritis [facility])     Patient Active Problem List   Diagnosis Date Noted  . Rheumatoid arthritis of multiple sites with negative rheumatoid factor ([place]) 01/30/2016  . High risk medication use 01/30/2016  .  History of total knee replacement, right 01/30/2016  . Primary osteoarthritis of both hands 01/30/2016  . Primary osteoarthritis of both feet 01/30/2016  . DDD C-spine 01/30/2016  . Osteoarthritis of lumbar spine 01/30/2016  . History of IBS 01/30/2016  . Hyperlipidemia   . DJD (degenerative joint disease) of knee   . Allergic rhinitis, seasonal   . BPH (benign prostatic hyperplasia)   . Hypertension   . Arthritis   . GERD (gastroesophageal reflux disease)   . CKD (chronic kidney disease) stage 3, GFR 30-59 ml/min (HCC)   . Polyp of colon, adenomatous 07/15/2009    Past Surgical History:  Procedure Laterality Date  . JOINT REPLACEMENT  12/2004   R TKR  . KNEE ARTHROPLASTY    . Right knee replacement  2007  . [place]  . Ulner Tranpos  2009       Home Medications    Prior to Admission medications   Medication Sig Start Date End Date Taking? Authorizing Provider  amitriptyline (ELAVIL) 10 MG tablet Take 1 tablet (10 mg total) by mouth at bedtime. 05/20/13 10/27/15  Rowe Clack, MD  atorvastatin (LIPITOR) 10 MG tablet Take 1 tablet (10 mg total) by mouth daily. Patient not taking: Reported on 11/13/2016 06/21/14   Rowe Clack, MD  b complex vitamins tablet Take 1 tablet by mouth daily.    [provider]  carisoprodol (SOMA) 350  MG tablet Take 350 mg by mouth daily as needed for muscle spasms.    [provider]  cholecalciferol (VITAMIN D) 1000 UNITS tablet Take 1,000 Units by mouth daily.    [provider]  esomeprazole (NEXIUM) 40 MG capsule Take 1 capsule (40 mg total) by mouth at bedtime. Patient taking differently: Take 40 mg by mouth as needed.  03/22/14   Rowe Clack, MD  famotidine (PEPCID AC) 10 MG chewable tablet Chew 10 mg by mouth as needed.     [provider]  finasteride (PROSCAR) 5 MG tablet Take 5 mg by mouth daily.      [provider]  fluticasone (FLONASE) 50 MCG/ACT nasal spray  Place into both nostrils daily.    [provider]  glucosamine-chondroitin 500-400 MG tablet Take 1 tablet by mouth 3 (three) times daily.    [provider]  LORazepam (ATIVAN) 0.5 MG tablet as needed. 11/09/16   [provider]  Lutein 20 MG CAPS Take 20 mg by mouth daily.    [provider]  meprobamate (EQUANIL) 200 MG tablet Take 2 tablets (400 mg total) by mouth at bedtime as needed (pain.). Patient not taking: Reported on 04/27/2016 06/02/14   Rowe Clack, MD  metoprolol succinate (TOPROL-XL) 100 MG 24 hr tablet Take 1 tablet (100 mg total) by mouth daily. 06/04/14   Rowe Clack, MD  montelukast (SINGULAIR) 10 MG tablet  11/12/16   [provider]  tamsulosin (FLOMAX) 0.4 MG CAPS capsule Take 1 capsule (0.4 mg total) by mouth daily. 04/09/13   Lawyer, Harrell Gave, PA-C  Tofacitinib Citrate (XELJANZ) 5 MG TABS Take 1 tablet by mouth daily. 02/13/17   Bo Merino, MD  vitamin C (ASCORBIC ACID) 500 MG tablet Take 4,000 mg by mouth daily.    [provider]  zolpidem (AMBIEN) 5 MG tablet Take 1 tablet (5 mg total) by mouth at bedtime as needed for sleep. 07/08/14   Rowe Clack, MD    Family History Family History  Problem Relation Age of Onset  . Colon cancer Mother     Social History Social History   Tobacco Use  . Smoking status: Former Smoker    Packs/day: 2.00    Years: 7.00    Pack years: 14.00    Types: Cigarettes    Last attempt to quit: 01/16/1956    Years since quitting: 61.1  . Smokeless tobacco: Never Used  Substance Use Topics  . Alcohol use: No    Frequency: Never  . Drug use: No     Allergies   Sulfa antibiotics   Review of Systems Review of Systems  Constitutional: Positive for chills, fatigue and unexpected weight change.  Respiratory: Positive for shortness of breath.   Gastrointestinal: Positive for nausea.  All other systems reviewed and are negative.    Physical  Exam Updated Vital Signs BP (!) 121/97   Pulse (!) 102   Temp 99.8 F (37.7 C) (Oral)   Resp 19   Ht 6\' 5"  (1.956 m)   Wt 88 kg (194 lb)   SpO2 91%   BMI 23.01 kg/m   Physical Exam  Constitutional: He is oriented to person, place, and time.  Chronically ill, dehydrated   HENT:  Head: Normocephalic.  MM dry   Eyes: Conjunctivae and EOM are normal. Pupils are equal, round, and reactive to light.  Neck: Normal range of motion. Neck supple.  Cardiovascular:  Tachycardic   Pulmonary/Chest: Effort normal.  Diminished R base   Abdominal: Soft. Bowel sounds are normal. He exhibits no distension. There is no tenderness. There is no guarding.  Musculoskeletal: Normal range of motion.  Neurological: He is alert and oriented to person, place, and time. He displays normal reflexes. No cranial nerve deficit. Coordination normal.  Skin: Skin is warm.  Psychiatric: He has a normal mood and affect.  Nursing note and vitals reviewed.    ED Treatments / Results  Labs (all labs ordered are listed, but only abnormal results are displayed) Labs Reviewed  CBC WITH DIFFERENTIAL/PLATELET - Abnormal; Notable for the following components:      Result Value   WBC 11.6 (*)    RBC 3.65 (*)    Hemoglobin 12.2 (*)    HCT 35.3 (*)    RDW 16.1 (*)    Neutro Abs 9.0 (*)    Monocytes Absolute 1.5 (*)    All other components within normal limits  COMPREHENSIVE METABOLIC PANEL - Abnormal; Notable for the following components:   Sodium 129 (*)    Chloride 99 (*)    CO2 19 (*)    Glucose, Bld 130 (*)    BUN 30 (*)    Creatinine, Ser 1.72 (*)    Albumin 2.7 (*)    GFR calc non Af Amer 35 (*)    GFR calc Af Amer 41 (*)    All other components within normal limits  URINE CULTURE  CULTURE, BLOOD (ROUTINE X 2)  CULTURE, BLOOD (ROUTINE X 2)  LIPASE, BLOOD  TROPONIN I  URINALYSIS, ROUTINE W REFLEX MICROSCOPIC  INFLUENZA PANEL BY PCR (TYPE A & B)  TSH  I-STAT CG4 LACTIC ACID, ED    EKG  EKG  Interpretation  Date/Time:  Wednesday February 27 2017 16:04:09 EST Ventricular Rate:  105 PR Interval:    QRS Duration: 114 QT Interval:  396 QTC Calculation: 514 R Axis:   -76 Text Interpretation:  Sinus tachycardia Ventricular premature complex Probable left atrial enlargement Borderline IVCD with LAD Anteroseptal infarct, age indeterminate Prolonged QT interval No significant change since last tracing Confirmed by Wandra Arthurs [phone]) on 02/27/2017 4:08:44 PM       Radiology Dg Chest 2 View  Result Date: 02/27/2017 CLINICAL DATA:  Cough for 2 months EXAM: CHEST  2 VIEW COMPARISON:  01/18/2017 FINDINGS: Cardiac shadow is stable. The lungs are well aerated bilaterally. Diffuse interstitial changes are noted as well as fullness in the region of the right hilum consistent with acute infiltrate. No sizable effusion is seen. No bony abnormality is noted. IMPRESSION: Right basilar infiltrate as well as some superimposed interstitial change new from the prior exam. Continued follow-up is recommended. Electronically Signed   By: Inez Catalina M.D.   On: 02/27/2017 15:39   Ct Angio Chest Pe W And/or Wo Contrast  Result Date: 02/27/2017 CLINICAL DATA:  Cough.  Weight loss.  Abdominal pain. EXAM: CT ANGIOGRAPHY CHEST CT ABDOMEN AND PELVIS WITH CONTRAST TECHNIQUE: Multidetector CT imaging of the chest was performed using the standard protocol during bolus administration of intravenous contrast. Multiplanar CT image reconstructions and MIPs were obtained to evaluate the vascular anatomy. Multidetector CT imaging of the abdomen and pelvis was performed using the standard protocol during bolus administration of intravenous contrast. CONTRAST:  8mL ISOVUE-370 IOPAMIDOL (ISOVUE-370) INJECTION 76% COMPARISON:  Chest radiograph from earlier today. 02/25/2015 renal sonogram. FINDINGS: CTA CHEST FINDINGS Cardiovascular: The study is low to moderate quality for the evaluation of pulmonary embolism, with evaluation  of  the segmental and subsegmental pulmonary artery branches significantly limited by motion artifact. There are no convincing filling defects in the central, lobar, segmental or subsegmental pulmonary artery branches to suggest acute pulmonary embolism. Atherosclerotic nonaneurysmal thoracic aorta. Dilated main pulmonary artery (3.8 cm diameter). Normal heart size. No significant pericardial fluid/thickening. Left anterior descending and right coronary atherosclerosis. Mediastinum/Nodes: No discrete thyroid nodules. Mildly patulous and otherwise grossly normal esophagus. Mild bilateral axillary adenopathy measuring up to 1.1 cm on the left (series 5/image 23) and 1.0 cm on the right (series 5/image 30). Mildly enlarged level 2 neck lymph nodes bilaterally, largest 1.1 cm on the right (series 5/image 3). Right paratracheal adenopathy measuring up to 1.7 cm (series 5/image 45). Enlarged 1.8 cm subcarinal node (series 5/image 54). AP window adenopathy measuring up to 1.3 cm (series 5/image 46). Enlarged left prevascular anterior mediastinal nodes up to 1.1 cm (series 5/image 43). Prominently enlarged 2.8 cm right hilar node (series 5/image 56). Enlarged left hilar nodes up to 1.4 cm (series 5/image 57). Lungs/Pleura: No pneumothorax. Small dependent bilateral pleural effusions. Prominent interlobular septal thickening and patchy ground-glass attenuation throughout both lungs. Right infrahilar 3.9 x 2.4 cm mass/adenopathy (series 5/image 71). Numerous poorly marginated subsolid pulmonary nodules throughout both lungs, for example a 1.8 cm inferior right middle lobe nodule (series 4/image 92), a 2.7 cm posterior right lower lobe nodule (series 4/image 70) and a 0.8 cm left upper lobe nodule (series 4/image 36). Musculoskeletal: No aggressive appearing focal osseous lesions. Marked thoracic spondylosis. Review of the MIP images confirms the above findings. CT ABDOMEN and PELVIS FINDINGS Motion degraded scan.  Hepatobiliary: Normal liver size. A few scattered subcentimeter hypodense lesions throughout the liver, too small to characterize. Layering cholelithiasis with no gallbladder wall thickening or pericholecystic fluid. No biliary ductal dilatation. Pancreas: Normal, with no mass or duct dilation. Spleen: Normal size. No mass. Adrenals/Urinary Tract: Normal adrenals. Nonobstructing left nephrolithiasis measuring 12 mm in the interpolar left kidney and 10 mm in the lower left kidney. No hydronephrosis. Multiple simple bilateral renal cysts, largest 6.7 cm in the upper right kidney and 5.4 cm in the interpolar left kidney. Additional subcentimeter hypodense renal cortical lesions in both kidneys are too small to characterize and require no follow-up. Mild diffuse bladder trabeculation with a few small bladder diverticula, largest 2.2 cm in the anterior right bladder wall. Stomach/Bowel: Normal non-distended stomach. Normal caliber small bowel with no small bowel wall thickening. Appendix not discretely visualized. No pericecal inflammatory changes. Normal large bowel with no diverticulosis, large bowel wall thickening or pericolonic fat stranding. Vascular/Lymphatic: Atherosclerotic abdominal aorta with ectatic 2.6 cm infrarenal abdominal aorta. Patent portal, splenic, hepatic and renal veins. Left para-aortic adenopathy measuring up to 1.1 cm (series 16/image 19). Enlarged 1.2 cm aortocaval node (series 16/image 21). Bilateral common iliac, external iliac and borderline inguinal lymphadenopathy. Representative enlarged 1.8 cm left common iliac node (series 16/image 29). Representative enlarged 2.3 cm left external iliac node (series 16/image 44). Representative borderline enlarged 1.3 cm left inguinal node (series 16/image 51). Mildly enlarged bilateral upper retroperitoneal nodes between the adrenal glands and diaphragmatic cru, largest 1.4 cm on the right (series 5/image 98). Reproductive: Markedly enlarged prostate  with nonspecific internal prostatic calcifications. Other: No pneumoperitoneum, ascites or focal fluid collection. Musculoskeletal: No aggressive appearing focal osseous lesions. Marked lumbar spondylosis. Nonspecific subcentimeter sclerotic foci in L1, L3 and L4 vertebral bodies. Review of the MIP images confirms the above findings. IMPRESSION: 1. Motion degraded scan.  No evidence of pulmonary embolism. 2.  Widespread lymphadenopathy involving the bilateral neck, bilateral axilla, bilateral mediastinum, bilateral hilum, retroperitoneum and bilateral pelvis. Poorly marginated pulmonary nodules and interlobular septal thickening throughout both lungs. Lymphoma is favored. Widely metastatic primary bronchogenic carcinoma is on the differential given the asymmetrically bulky right hilar/right infrahilar adenopathy, but is considered less likely. 3. Small dependent bilateral pleural effusions. 4. Dilated main pulmonary artery, suggesting pulmonary arterial hypertension. 5. Markedly enlarged prostate. Diffuse bladder trabeculation and small bladder diverticula compatible with chronic bladder outlet obstruction. Nonobstructing left nephrolithiasis. No hydronephrosis. 6. Aortic Atherosclerosis (ICD10-I70.0). Ectatic infrarenal 2.6 cm abdominal aorta at risk for aneurysm development. Recommend followup by ultrasound in 5 years. This recommendation follows ACR consensus guidelines: White Paper of the ACR Incidental Findings Committee II on Vascular Findings. J Am Coll Radiol 2013; 10:789-794. 7. Cholelithiasis. Electronically Signed   By: Ilona Sorrel M.D.   On: 02/27/2017 17:42   Ct Abdomen Pelvis W Contrast  Result Date: 02/27/2017 CLINICAL DATA:  Cough.  Weight loss.  Abdominal pain. EXAM: CT ANGIOGRAPHY CHEST CT ABDOMEN AND PELVIS WITH CONTRAST TECHNIQUE: Multidetector CT imaging of the chest was performed using the standard protocol during bolus administration of intravenous contrast. Multiplanar CT image  reconstructions and MIPs were obtained to evaluate the vascular anatomy. Multidetector CT imaging of the abdomen and pelvis was performed using the standard protocol during bolus administration of intravenous contrast. CONTRAST:  60mL ISOVUE-370 IOPAMIDOL (ISOVUE-370) INJECTION 76% COMPARISON:  Chest radiograph from earlier today. 02/25/2015 renal sonogram. FINDINGS: CTA CHEST FINDINGS Cardiovascular: The study is low to moderate quality for the evaluation of pulmonary embolism, with evaluation of the segmental and subsegmental pulmonary artery branches significantly limited by motion artifact. There are no convincing filling defects in the central, lobar, segmental or subsegmental pulmonary artery branches to suggest acute pulmonary embolism. Atherosclerotic nonaneurysmal thoracic aorta. Dilated main pulmonary artery (3.8 cm diameter). Normal heart size. No significant pericardial fluid/thickening. Left anterior descending and right coronary atherosclerosis. Mediastinum/Nodes: No discrete thyroid nodules. Mildly patulous and otherwise grossly normal esophagus. Mild bilateral axillary adenopathy measuring up to 1.1 cm on the left (series 5/image 23) and 1.0 cm on the right (series 5/image 30). Mildly enlarged level 2 neck lymph nodes bilaterally, largest 1.1 cm on the right (series 5/image 3). Right paratracheal adenopathy measuring up to 1.7 cm (series 5/image 45). Enlarged 1.8 cm subcarinal node (series 5/image 54). AP window adenopathy measuring up to 1.3 cm (series 5/image 46). Enlarged left prevascular anterior mediastinal nodes up to 1.1 cm (series 5/image 43). Prominently enlarged 2.8 cm right hilar node (series 5/image 56). Enlarged left hilar nodes up to 1.4 cm (series 5/image 57). Lungs/Pleura: No pneumothorax. Small dependent bilateral pleural effusions. Prominent interlobular septal thickening and patchy ground-glass attenuation throughout both lungs. Right infrahilar 3.9 x 2.4 cm mass/adenopathy (series  5/image 71). Numerous poorly marginated subsolid pulmonary nodules throughout both lungs, for example a 1.8 cm inferior right middle lobe nodule (series 4/image 92), a 2.7 cm posterior right lower lobe nodule (series 4/image 70) and a 0.8 cm left upper lobe nodule (series 4/image 36). Musculoskeletal: No aggressive appearing focal osseous lesions. Marked thoracic spondylosis. Review of the MIP images confirms the above findings. CT ABDOMEN and PELVIS FINDINGS Motion degraded scan. Hepatobiliary: Normal liver size. A few scattered subcentimeter hypodense lesions throughout the liver, too small to characterize. Layering cholelithiasis with no gallbladder wall thickening or pericholecystic fluid. No biliary ductal dilatation. Pancreas: Normal, with no mass or duct dilation. Spleen: Normal size. No mass. Adrenals/Urinary Tract: Normal adrenals. Nonobstructing left  nephrolithiasis measuring 12 mm in the interpolar left kidney and 10 mm in the lower left kidney. No hydronephrosis. Multiple simple bilateral renal cysts, largest 6.7 cm in the upper right kidney and 5.4 cm in the interpolar left kidney. Additional subcentimeter hypodense renal cortical lesions in both kidneys are too small to characterize and require no follow-up. Mild diffuse bladder trabeculation with a few small bladder diverticula, largest 2.2 cm in the anterior right bladder wall. Stomach/Bowel: Normal non-distended stomach. Normal caliber small bowel with no small bowel wall thickening. Appendix not discretely visualized. No pericecal inflammatory changes. Normal large bowel with no diverticulosis, large bowel wall thickening or pericolonic fat stranding. Vascular/Lymphatic: Atherosclerotic abdominal aorta with ectatic 2.6 cm infrarenal abdominal aorta. Patent portal, splenic, hepatic and renal veins. Left para-aortic adenopathy measuring up to 1.1 cm (series 16/image 19). Enlarged 1.2 cm aortocaval node (series 16/image 21). Bilateral common iliac,  external iliac and borderline inguinal lymphadenopathy. Representative enlarged 1.8 cm left common iliac node (series 16/image 29). Representative enlarged 2.3 cm left external iliac node (series 16/image 44). Representative borderline enlarged 1.3 cm left inguinal node (series 16/image 51). Mildly enlarged bilateral upper retroperitoneal nodes between the adrenal glands and diaphragmatic cru, largest 1.4 cm on the right (series 5/image 98). Reproductive: Markedly enlarged prostate with nonspecific internal prostatic calcifications. Other: No pneumoperitoneum, ascites or focal fluid collection. Musculoskeletal: No aggressive appearing focal osseous lesions. Marked lumbar spondylosis. Nonspecific subcentimeter sclerotic foci in L1, L3 and L4 vertebral bodies. Review of the MIP images confirms the above findings. IMPRESSION: 1. Motion degraded scan.  No evidence of pulmonary embolism. 2. Widespread lymphadenopathy involving the bilateral neck, bilateral axilla, bilateral mediastinum, bilateral hilum, retroperitoneum and bilateral pelvis. Poorly marginated pulmonary nodules and interlobular septal thickening throughout both lungs. Lymphoma is favored. Widely metastatic primary bronchogenic carcinoma is on the differential given the asymmetrically bulky right hilar/right infrahilar adenopathy, but is considered less likely. 3. Small dependent bilateral pleural effusions. 4. Dilated main pulmonary artery, suggesting pulmonary arterial hypertension. 5. Markedly enlarged prostate. Diffuse bladder trabeculation and small bladder diverticula compatible with chronic bladder outlet obstruction. Nonobstructing left nephrolithiasis. No hydronephrosis. 6. Aortic Atherosclerosis (ICD10-I70.0). Ectatic infrarenal 2.6 cm abdominal aorta at risk for aneurysm development. Recommend followup by ultrasound in 5 years. This recommendation follows ACR consensus guidelines: White Paper of the ACR Incidental Findings Committee II on  Vascular Findings. J Am Coll Radiol 2013; 10:789-794. 7. Cholelithiasis. Electronically Signed   By: Ilona Sorrel M.D.   On: 02/27/2017 17:42    Procedures Procedures (including critical care time)  Medications Ordered in ED Medications  methylPREDNISolone sodium succinate (SOLU-MEDROL) 125 mg/2 mL injection 125 mg (125 mg Intravenous Refused 02/27/17 1654)  cefTRIAXone (ROCEPHIN) 1 g in sodium chloride 0.9 % 100 mL IVPB (not administered)  azithromycin (ZITHROMAX) 500 mg in sodium chloride 0.9 % 250 mL IVPB (not administered)  azithromycin (ZITHROMAX) 500 MG injection (not administered)  sodium chloride 0.9 % bolus 1,000 mL (0 mLs Intravenous Stopped 02/27/17 1733)  ondansetron (ZOFRAN) injection 4 mg (4 mg Intravenous Given 02/27/17 1540)  iopamidol (ISOVUE-370) 76 % injection 100 mL (80 mLs Intravenous Contrast Given 02/27/17 1635)  diphenhydrAMINE (BENADRYL) injection 25 mg (25 mg Intravenous Given 02/27/17 1655)  famotidine (PEPCID) IVPB 20 mg premix (0 mg Intravenous Stopped 02/27/17 1733)     Initial Impression / Assessment and Plan / ED Course  I have reviewed the triage vital signs and the nursing notes.  Pertinent labs & imaging results that were available during my care of  the patient were reviewed by me and considered in my medical decision making (see chart for details).     Jose Meza is a 84 y.o. male here with failure to thrive, persistent chills. Differential is broad- including malignancies, infection (pneumonia, UTI, flu), hyperthyroidism, electrolyte abnormalities. He is tachycardic and has shortness of breath so consider PE as well. Will get labs, UA, CXR, flu, CTA chest, CT ab/pel.   4:50 pm Patient had possible reaction at CT. Felt SOB but OP clear. Lungs clear. Spontaneously resolved with no intervention. Maybe anxiety vs allergic reaction. Ordered steroids, benadryl.   6:28 PM CTA showed no PE. Possible pneumonia vs lymphoma. Lactate nl, WBC 11. Given rocephin,  azithromycin. O2 still 90-91% on RA. Placed on 2 L [place]. Dr. Shanon Brow to admit to [facility].    Final Clinical Impressions(s) / ED Diagnoses   Final diagnoses:  None    ED Discharge Orders    None       Drenda Freeze, MD 02/27/17 [phone]

## 2017-02-27 NOTE — ED Notes (Signed)
Report to Danielle, RN at [facility].  

## 2017-02-27 NOTE — ED Notes (Signed)
Pt placed on 2L [place]

## 2017-02-27 NOTE — ED Notes (Signed)
Patient transported to CT 

## 2017-02-27 NOTE — ED Notes (Addendum)
While receiving CT contrast, felt like throat was closing up. ED MD in to see. When RN arrived , states" I feel better now" and denies throat tightening. ED MD informed and will administer meds upon returning to room. CT tech to finish up scan

## 2017-02-27 NOTE — H&P (Addendum)
History and Physical    Jose Meza:[id number] DOB: 1931/07/27 DOA: 02/27/2017  PCP: Lajean Manes, MD  Patient coming from: Home  Chief Complaint: Cough and chills  HPI: Jose Meza is a 84 y.o. male with medical history significant of chronic kidney disease, rheumatoid arthritis chronically on steroids, hypertension transferred here from [facility] after finding use disease in his chest concerning for lymphoma.  Patient arrived there with complaints of coughing for over a week with chills and low-grade fevers at home.  Is lost over 20 pounds in the last 4-6 weeks.  He is recently been treated for prostatitis with an unknown antibiotic a month ago.  His symptoms have persisted however despite recent steroid taper for bronchitis also.  He denies any nausea vomiting or diarrhea.  He has not noticed any swollen areas on his body lately.  He denies any rashes.  CTA was done which showed diffuse disease concerning for lymphoma with possible pneumonia.  Patient is referred for admission for hypoxia with 90% O2 sats on room air along with possible pneumonia.  He denies any pain.  Review of Systems: As per HPI otherwise 10 point review of systems negative.   Past Medical History:  Diagnosis Date  . Allergic rhinitis, seasonal   . Arthritis    neg rheum eval  . BPH (benign prostatic hypertrophy)    recurrent prostatitis, hx BOO  . Cauda equina, syndrome   . Chronic kidney disease    stage 2-3  . DJD (degenerative joint disease) of knee    right knee, s/p knee replacement 12/06  . GERD (gastroesophageal reflux disease)   . Hyperlipidemia   . Hypertension   . IBS (irritable bowel syndrome)    constipation prone  . Incomplete RBBB    ECG 05/08/13  . Polyp of colon, adenomatous 07/2009   buccini  . Rheumatoid arthritis [facility])     Past Surgical History:  Procedure Laterality Date  . JOINT REPLACEMENT  12/2004   R TKR  . KNEE ARTHROPLASTY    . Right knee replacement   2007  . [place]  . Ulner Tranpos  2009     reports that he quit smoking about 61 years ago. His smoking use included cigarettes. He has a 14.00 pack-year smoking history. he has never used smokeless tobacco. He reports that he does not drink alcohol or use drugs.  Allergies  Allergen Reactions  . Sulfa Antibiotics Other (See Comments)    Paradoxical effect. Patient states Sulfa does not work for him.    Family History  Problem Relation Age of Onset  . Colon cancer Mother     Prior to Admission medications   Medication Sig Start Date End Date Taking? Authorizing Provider  amitriptyline (ELAVIL) 10 MG tablet Take 1 tablet (10 mg total) by mouth at bedtime. 05/20/13 10/27/15  Rowe Clack, MD  atorvastatin (LIPITOR) 10 MG tablet Take 1 tablet (10 mg total) by mouth daily. Patient not taking: Reported on 11/13/2016 06/21/14   Rowe Clack, MD  b complex vitamins tablet Take 1 tablet by mouth daily.    [provider]  carisoprodol (SOMA) 350 MG tablet Take 350 mg by mouth daily as needed for muscle spasms.    [provider]  cholecalciferol (VITAMIN D) 1000 UNITS tablet Take 1,000 Units by mouth daily.    [provider]  esomeprazole (NEXIUM) 40 MG capsule Take 1 capsule (40 mg total) by mouth at bedtime.  Patient taking differently: Take 40 mg by mouth as needed.  03/22/14   Rowe Clack, MD  famotidine (PEPCID AC) 10 MG chewable tablet Chew 10 mg by mouth as needed.     [provider]  finasteride (PROSCAR) 5 MG tablet Take 5 mg by mouth daily.      [provider]  fluticasone (FLONASE) 50 MCG/ACT nasal spray Place into both nostrils daily.    [provider]  glucosamine-chondroitin 500-400 MG tablet Take 1 tablet by mouth 3 (three) times daily.    [provider]  LORazepam (ATIVAN) 0.5 MG tablet as needed. 11/09/16   [provider]  Lutein 20 MG CAPS Take 20 mg by mouth daily.     [provider]  meprobamate (EQUANIL) 200 MG tablet Take 2 tablets (400 mg total) by mouth at bedtime as needed (pain.). Patient not taking: Reported on 04/27/2016 06/02/14   Rowe Clack, MD  metoprolol succinate (TOPROL-XL) 100 MG 24 hr tablet Take 1 tablet (100 mg total) by mouth daily. 06/04/14   Rowe Clack, MD  montelukast (SINGULAIR) 10 MG tablet  11/12/16   [provider]  tamsulosin (FLOMAX) 0.4 MG CAPS capsule Take 1 capsule (0.4 mg total) by mouth daily. 04/09/13   Lawyer, Harrell Gave, PA-C  Tofacitinib Citrate (XELJANZ) 5 MG TABS Take 1 tablet by mouth daily. 02/13/17   Bo Merino, MD  vitamin C (ASCORBIC ACID) 500 MG tablet Take 4,000 mg by mouth daily.    [provider]  zolpidem (AMBIEN) 5 MG tablet Take 1 tablet (5 mg total) by mouth at bedtime as needed for sleep. 07/08/14   Rowe Clack, MD    Physical Exam: Vitals:   02/27/17 1730 02/27/17 1830 02/27/17 1930 02/27/17 1946  BP: (!) 121/97 117/65 118/71   Pulse: (!) 102 99 99   Resp: 19 (!) 34 (!) 30   Temp:      TempSrc:      SpO2: 91% 95% 99% 99%  Weight:      Height:          Constitutional: NAD, calm, comfortable Vitals:   02/27/17 1730 02/27/17 1830 02/27/17 1930 02/27/17 1946  BP: (!) 121/97 117/65 118/71   Pulse: (!) 102 99 99   Resp: 19 (!) 34 (!) 30   Temp:      TempSrc:      SpO2: 91% 95% 99% 99%  Weight:      Height:       Eyes: PERRL, lids and conjunctivae normal ENMT: Mucous membranes are moist. Posterior pharynx clear of any exudate or lesions.Normal dentition.  Neck: normal, supple, no masses, no thyromegaly Respiratory: clear to auscultation bilaterally, no wheezing, no crackles. Normal respiratory effort. No accessory muscle use.  Cardiovascular: Regular rate and rhythm, no murmurs / rubs / gallops. No extremity edema. 2+ pedal pulses. No carotid bruits.  Abdomen: no tenderness, no masses palpated. No hepatosplenomegaly. Bowel sounds  positive.  Musculoskeletal: no clubbing / cyanosis. No joint deformity upper and lower extremities. Good ROM, no contractures. Normal muscle tone.  Skin: no rashes, lesions, ulcers. No induration Neurologic: CN 2-12 grossly intact. Sensation intact, DTR normal. Strength 5/5 in all 4.  Psychiatric: Normal judgment and insight. Alert and oriented x 3. Normal mood.    Labs on Admission: I have personally reviewed following labs and imaging studies  CBC: Recent Labs  Lab 02/27/17 1514  WBC 11.6*  NEUTROABS 9.0*  HGB 12.2*  HCT 35.3*  MCV  96.7  PLT 182   Basic Metabolic Panel: Recent Labs  Lab 02/27/17 1514  NA 129*  K 4.2  CL 99*  CO2 19*  GLUCOSE 130*  BUN 30*  CREATININE 1.72*  CALCIUM 9.0   GFR: Estimated Creatinine Clearance: 40.5 mL/min (A) (by C-G formula based on SCr of 1.72 mg/dL (H)). Liver Function Tests: Recent Labs  Lab 02/27/17 1514  AST 29  ALT 25  ALKPHOS 59  BILITOT 0.4  PROT 7.3  ALBUMIN 2.7*   Recent Labs  Lab 02/27/17 1514  LIPASE 46   No results for input(s): AMMONIA in the last 168 hours. Coagulation Profile: No results for input(s): INR, PROTIME in the last 168 hours. Cardiac Enzymes: Recent Labs  Lab 02/27/17 1514  TROPONINI <0.03   BNP (last 3 results) No results for input(s): PROBNP in the last 8760 hours. HbA1C: No results for input(s): HGBA1C in the last 72 hours. CBG: No results for input(s): GLUCAP in the last 168 hours. Lipid Profile: No results for input(s): CHOL, HDL, LDLCALC, TRIG, CHOLHDL, LDLDIRECT in the last 72 hours. Thyroid Function Tests: No results for input(s): TSH, T4TOTAL, FREET4, T3FREE, THYROIDAB in the last 72 hours. Anemia Panel: No results for input(s): VITAMINB12, FOLATE, FERRITIN, TIBC, IRON, RETICCTPCT in the last 72 hours. Urine analysis:    Component Value Date/Time   COLORURINE YELLOW 02/27/2017 1940   APPEARANCEUR CLOUDY (A) 02/27/2017 1940   LABSPEC <1.005 (L) 02/27/2017 1940   PHURINE  6.0 02/27/2017 1940   GLUCOSEU NEGATIVE 02/27/2017 1940   HGBUR TRACE (A) 02/27/2017 1940   BILIRUBINUR NEGATIVE 02/27/2017 1940   KETONESUR NEGATIVE 02/27/2017 1940   PROTEINUR NEGATIVE 02/27/2017 1940   UROBILINOGEN 0.2 04/09/2013 1903   NITRITE POSITIVE (A) 02/27/2017 1940   LEUKOCYTESUR MODERATE (A) 02/27/2017 1940   Sepsis Labs: !!!!!!!!!!!!!!!!!!!!!!!!!!!!!!!!!!!!!!!!!!!! @LABRCNTIP (procalcitonin:4,lacticidven:4) )No results found for this or any previous visit (from the past 240 hour(s)).   Radiological Exams on Admission: Dg Chest 2 View  Result Date: 02/27/2017 CLINICAL DATA:  Cough for 2 months EXAM: CHEST  2 VIEW COMPARISON:  01/18/2017 FINDINGS: Cardiac shadow is stable. The lungs are well aerated bilaterally. Diffuse interstitial changes are noted as well as fullness in the region of the right hilum consistent with acute infiltrate. No sizable effusion is seen. No bony abnormality is noted. IMPRESSION: Right basilar infiltrate as well as some superimposed interstitial change new from the prior exam. Continued follow-up is recommended. Electronically Signed   By: Inez Catalina M.D.   On: 02/27/2017 15:39   Ct Angio Chest Pe W And/or Wo Contrast  Result Date: 02/27/2017 CLINICAL DATA:  Cough.  Weight loss.  Abdominal pain. EXAM: CT ANGIOGRAPHY CHEST CT ABDOMEN AND PELVIS WITH CONTRAST TECHNIQUE: Multidetector CT imaging of the chest was performed using the standard protocol during bolus administration of intravenous contrast. Multiplanar CT image reconstructions and MIPs were obtained to evaluate the vascular anatomy. Multidetector CT imaging of the abdomen and pelvis was performed using the standard protocol during bolus administration of intravenous contrast. CONTRAST:  62mL ISOVUE-370 IOPAMIDOL (ISOVUE-370) INJECTION 76% COMPARISON:  Chest radiograph from earlier today. 02/25/2015 renal sonogram. FINDINGS: CTA CHEST FINDINGS Cardiovascular: The study is low to moderate quality for  the evaluation of pulmonary embolism, with evaluation of the segmental and subsegmental pulmonary artery branches significantly limited by motion artifact. There are no convincing filling defects in the central, lobar, segmental or subsegmental pulmonary artery branches to suggest acute pulmonary embolism. Atherosclerotic nonaneurysmal thoracic aorta. Dilated main pulmonary artery (3.8 cm  diameter). Normal heart size. No significant pericardial fluid/thickening. Left anterior descending and right coronary atherosclerosis. Mediastinum/Nodes: No discrete thyroid nodules. Mildly patulous and otherwise grossly normal esophagus. Mild bilateral axillary adenopathy measuring up to 1.1 cm on the left (series 5/image 23) and 1.0 cm on the right (series 5/image 30). Mildly enlarged level 2 neck lymph nodes bilaterally, largest 1.1 cm on the right (series 5/image 3). Right paratracheal adenopathy measuring up to 1.7 cm (series 5/image 45). Enlarged 1.8 cm subcarinal node (series 5/image 54). AP window adenopathy measuring up to 1.3 cm (series 5/image 46). Enlarged left prevascular anterior mediastinal nodes up to 1.1 cm (series 5/image 43). Prominently enlarged 2.8 cm right hilar node (series 5/image 56). Enlarged left hilar nodes up to 1.4 cm (series 5/image 57). Lungs/Pleura: No pneumothorax. Small dependent bilateral pleural effusions. Prominent interlobular septal thickening and patchy ground-glass attenuation throughout both lungs. Right infrahilar 3.9 x 2.4 cm mass/adenopathy (series 5/image 71). Numerous poorly marginated subsolid pulmonary nodules throughout both lungs, for example a 1.8 cm inferior right middle lobe nodule (series 4/image 92), a 2.7 cm posterior right lower lobe nodule (series 4/image 70) and a 0.8 cm left upper lobe nodule (series 4/image 36). Musculoskeletal: No aggressive appearing focal osseous lesions. Marked thoracic spondylosis. Review of the MIP images confirms the above findings. CT ABDOMEN  and PELVIS FINDINGS Motion degraded scan. Hepatobiliary: Normal liver size. A few scattered subcentimeter hypodense lesions throughout the liver, too small to characterize. Layering cholelithiasis with no gallbladder wall thickening or pericholecystic fluid. No biliary ductal dilatation. Pancreas: Normal, with no mass or duct dilation. Spleen: Normal size. No mass. Adrenals/Urinary Tract: Normal adrenals. Nonobstructing left nephrolithiasis measuring 12 mm in the interpolar left kidney and 10 mm in the lower left kidney. No hydronephrosis. Multiple simple bilateral renal cysts, largest 6.7 cm in the upper right kidney and 5.4 cm in the interpolar left kidney. Additional subcentimeter hypodense renal cortical lesions in both kidneys are too small to characterize and require no follow-up. Mild diffuse bladder trabeculation with a few small bladder diverticula, largest 2.2 cm in the anterior right bladder wall. Stomach/Bowel: Normal non-distended stomach. Normal caliber small bowel with no small bowel wall thickening. Appendix not discretely visualized. No pericecal inflammatory changes. Normal large bowel with no diverticulosis, large bowel wall thickening or pericolonic fat stranding. Vascular/Lymphatic: Atherosclerotic abdominal aorta with ectatic 2.6 cm infrarenal abdominal aorta. Patent portal, splenic, hepatic and renal veins. Left para-aortic adenopathy measuring up to 1.1 cm (series 16/image 19). Enlarged 1.2 cm aortocaval node (series 16/image 21). Bilateral common iliac, external iliac and borderline inguinal lymphadenopathy. Representative enlarged 1.8 cm left common iliac node (series 16/image 29). Representative enlarged 2.3 cm left external iliac node (series 16/image 44). Representative borderline enlarged 1.3 cm left inguinal node (series 16/image 51). Mildly enlarged bilateral upper retroperitoneal nodes between the adrenal glands and diaphragmatic cru, largest 1.4 cm on the right (series 5/image 98).  Reproductive: Markedly enlarged prostate with nonspecific internal prostatic calcifications. Other: No pneumoperitoneum, ascites or focal fluid collection. Musculoskeletal: No aggressive appearing focal osseous lesions. Marked lumbar spondylosis. Nonspecific subcentimeter sclerotic foci in L1, L3 and L4 vertebral bodies. Review of the MIP images confirms the above findings. IMPRESSION: 1. Motion degraded scan.  No evidence of pulmonary embolism. 2. Widespread lymphadenopathy involving the bilateral neck, bilateral axilla, bilateral mediastinum, bilateral hilum, retroperitoneum and bilateral pelvis. Poorly marginated pulmonary nodules and interlobular septal thickening throughout both lungs. Lymphoma is favored. Widely metastatic primary bronchogenic carcinoma is on the differential given the asymmetrically bulky  right hilar/right infrahilar adenopathy, but is considered less likely. 3. Small dependent bilateral pleural effusions. 4. Dilated main pulmonary artery, suggesting pulmonary arterial hypertension. 5. Markedly enlarged prostate. Diffuse bladder trabeculation and small bladder diverticula compatible with chronic bladder outlet obstruction. Nonobstructing left nephrolithiasis. No hydronephrosis. 6. Aortic Atherosclerosis (ICD10-I70.0). Ectatic infrarenal 2.6 cm abdominal aorta at risk for aneurysm development. Recommend followup by ultrasound in 5 years. This recommendation follows ACR consensus guidelines: White Paper of the ACR Incidental Findings Committee II on Vascular Findings. J Am Coll Radiol 2013; 10:789-794. 7. Cholelithiasis. Electronically Signed   By: Ilona Sorrel M.D.   On: 02/27/2017 17:42   Ct Abdomen Pelvis W Contrast  Result Date: 02/27/2017 CLINICAL DATA:  Cough.  Weight loss.  Abdominal pain. EXAM: CT ANGIOGRAPHY CHEST CT ABDOMEN AND PELVIS WITH CONTRAST TECHNIQUE: Multidetector CT imaging of the chest was performed using the standard protocol during bolus administration of intravenous  contrast. Multiplanar CT image reconstructions and MIPs were obtained to evaluate the vascular anatomy. Multidetector CT imaging of the abdomen and pelvis was performed using the standard protocol during bolus administration of intravenous contrast. CONTRAST:  9mL ISOVUE-370 IOPAMIDOL (ISOVUE-370) INJECTION 76% COMPARISON:  Chest radiograph from earlier today. 02/25/2015 renal sonogram. FINDINGS: CTA CHEST FINDINGS Cardiovascular: The study is low to moderate quality for the evaluation of pulmonary embolism, with evaluation of the segmental and subsegmental pulmonary artery branches significantly limited by motion artifact. There are no convincing filling defects in the central, lobar, segmental or subsegmental pulmonary artery branches to suggest acute pulmonary embolism. Atherosclerotic nonaneurysmal thoracic aorta. Dilated main pulmonary artery (3.8 cm diameter). Normal heart size. No significant pericardial fluid/thickening. Left anterior descending and right coronary atherosclerosis. Mediastinum/Nodes: No discrete thyroid nodules. Mildly patulous and otherwise grossly normal esophagus. Mild bilateral axillary adenopathy measuring up to 1.1 cm on the left (series 5/image 23) and 1.0 cm on the right (series 5/image 30). Mildly enlarged level 2 neck lymph nodes bilaterally, largest 1.1 cm on the right (series 5/image 3). Right paratracheal adenopathy measuring up to 1.7 cm (series 5/image 45). Enlarged 1.8 cm subcarinal node (series 5/image 54). AP window adenopathy measuring up to 1.3 cm (series 5/image 46). Enlarged left prevascular anterior mediastinal nodes up to 1.1 cm (series 5/image 43). Prominently enlarged 2.8 cm right hilar node (series 5/image 56). Enlarged left hilar nodes up to 1.4 cm (series 5/image 57). Lungs/Pleura: No pneumothorax. Small dependent bilateral pleural effusions. Prominent interlobular septal thickening and patchy ground-glass attenuation throughout both lungs. Right infrahilar 3.9 x  2.4 cm mass/adenopathy (series 5/image 71). Numerous poorly marginated subsolid pulmonary nodules throughout both lungs, for example a 1.8 cm inferior right middle lobe nodule (series 4/image 92), a 2.7 cm posterior right lower lobe nodule (series 4/image 70) and a 0.8 cm left upper lobe nodule (series 4/image 36). Musculoskeletal: No aggressive appearing focal osseous lesions. Marked thoracic spondylosis. Review of the MIP images confirms the above findings. CT ABDOMEN and PELVIS FINDINGS Motion degraded scan. Hepatobiliary: Normal liver size. A few scattered subcentimeter hypodense lesions throughout the liver, too small to characterize. Layering cholelithiasis with no gallbladder wall thickening or pericholecystic fluid. No biliary ductal dilatation. Pancreas: Normal, with no mass or duct dilation. Spleen: Normal size. No mass. Adrenals/Urinary Tract: Normal adrenals. Nonobstructing left nephrolithiasis measuring 12 mm in the interpolar left kidney and 10 mm in the lower left kidney. No hydronephrosis. Multiple simple bilateral renal cysts, largest 6.7 cm in the upper right kidney and 5.4 cm in the interpolar left kidney. Additional subcentimeter hypodense  renal cortical lesions in both kidneys are too small to characterize and require no follow-up. Mild diffuse bladder trabeculation with a few small bladder diverticula, largest 2.2 cm in the anterior right bladder wall. Stomach/Bowel: Normal non-distended stomach. Normal caliber small bowel with no small bowel wall thickening. Appendix not discretely visualized. No pericecal inflammatory changes. Normal large bowel with no diverticulosis, large bowel wall thickening or pericolonic fat stranding. Vascular/Lymphatic: Atherosclerotic abdominal aorta with ectatic 2.6 cm infrarenal abdominal aorta. Patent portal, splenic, hepatic and renal veins. Left para-aortic adenopathy measuring up to 1.1 cm (series 16/image 19). Enlarged 1.2 cm aortocaval node (series 16/image  21). Bilateral common iliac, external iliac and borderline inguinal lymphadenopathy. Representative enlarged 1.8 cm left common iliac node (series 16/image 29). Representative enlarged 2.3 cm left external iliac node (series 16/image 44). Representative borderline enlarged 1.3 cm left inguinal node (series 16/image 51). Mildly enlarged bilateral upper retroperitoneal nodes between the adrenal glands and diaphragmatic cru, largest 1.4 cm on the right (series 5/image 98). Reproductive: Markedly enlarged prostate with nonspecific internal prostatic calcifications. Other: No pneumoperitoneum, ascites or focal fluid collection. Musculoskeletal: No aggressive appearing focal osseous lesions. Marked lumbar spondylosis. Nonspecific subcentimeter sclerotic foci in L1, L3 and L4 vertebral bodies. Review of the MIP images confirms the above findings. IMPRESSION: 1. Motion degraded scan.  No evidence of pulmonary embolism. 2. Widespread lymphadenopathy involving the bilateral neck, bilateral axilla, bilateral mediastinum, bilateral hilum, retroperitoneum and bilateral pelvis. Poorly marginated pulmonary nodules and interlobular septal thickening throughout both lungs. Lymphoma is favored. Widely metastatic primary bronchogenic carcinoma is on the differential given the asymmetrically bulky right hilar/right infrahilar adenopathy, but is considered less likely. 3. Small dependent bilateral pleural effusions. 4. Dilated main pulmonary artery, suggesting pulmonary arterial hypertension. 5. Markedly enlarged prostate. Diffuse bladder trabeculation and small bladder diverticula compatible with chronic bladder outlet obstruction. Nonobstructing left nephrolithiasis. No hydronephrosis. 6. Aortic Atherosclerosis (ICD10-I70.0). Ectatic infrarenal 2.6 cm abdominal aorta at risk for aneurysm development. Recommend followup by ultrasound in 5 years. This recommendation follows ACR consensus guidelines: White Paper of the ACR Incidental  Findings Committee II on Vascular Findings. J Am Coll Radiol 2013; 10:789-794. 7. Cholelithiasis. Electronically Signed   By: Ilona Sorrel M.D.   On: 02/27/2017 17:42    Old chart reviewed Case discussed with EDP  Assessment/Plan 84 year old male with mild hypoxia found to have widespread lymphadenopathy in the neck axilla mediastinum along with probable pneumonia  Principal Problem:   PNA (pneumonia)-placed on IV Rocephin and azithromycin.  Obtain blood cultures and sputum cultures.  Vital signs stable patient lactic acid level is normal.  No wheezing on exam.  Active Problems:   Hyperlipidemia-clarify home meds   Hypertension-Home meds   CKD (chronic kidney disease) stage 3, GFR 30-59 ml/min (HCC)-stable at baseline   Rheumatoid arthritis of multiple sites with negative rheumatoid factor (HCC)-continue oral prednisone Diffuse lymphadenopathy on CTA of chest-I have ordered for IR to try to biopsy 1 of his lymph nodes if possible tomorrow  DVT prophylaxis: SCDs Code Status: Full Family Communication: None Disposition Plan: Per day team Consults called: IR Admission status: Observation   Amethyst Gainer A MD [facility]  If 7PM-7AM, please contact night-coverage [email or web address] Password [facility]  02/27/2017, 10:00 PM   Patient seen before midnight

## 2017-02-27 NOTE — ED Triage Notes (Signed)
Pt feels he is dehydrated due to decreased po intake-recent tx for bronchitis, prostatitis-is scheduled for nasal surgery-was advised by PCP to come to ED-pt NAD-presents to triage in w/c with daughter

## 2017-02-27 NOTE — ED Notes (Signed)
Attempted to call report to 1W; nurse unavailable; this RN contact info provided.

## 2017-02-27 NOTE — Progress Notes (Signed)
Pt admitted from [facility]. [facility] transported pt to room. Pt ambulated from stretcher to bed with standby assistance. VSS. Pt on 4L O2 [place]. Placed in bed. Dr. Shanon Brow paged for admission orders.

## 2017-02-28 ENCOUNTER — Telehealth: Payer: Self-pay | Admitting: Rheumatology

## 2017-02-28 ENCOUNTER — Observation Stay (HOSPITAL_COMMUNITY): Payer: Medicare HMO

## 2017-02-28 DIAGNOSIS — N390 Urinary tract infection, site not specified: Secondary | ICD-10-CM | POA: Diagnosis not present

## 2017-02-28 DIAGNOSIS — E43 Unspecified severe protein-calorie malnutrition: Secondary | ICD-10-CM | POA: Diagnosis not present

## 2017-02-28 DIAGNOSIS — R05 Cough: Secondary | ICD-10-CM | POA: Diagnosis not present

## 2017-02-28 DIAGNOSIS — R634 Abnormal weight loss: Secondary | ICD-10-CM | POA: Diagnosis not present

## 2017-02-28 DIAGNOSIS — Z882 Allergy status to sulfonamides status: Secondary | ICD-10-CM | POA: Diagnosis not present

## 2017-02-28 DIAGNOSIS — Z978 Presence of other specified devices: Secondary | ICD-10-CM | POA: Diagnosis not present

## 2017-02-28 DIAGNOSIS — C865 Angioimmunoblastic T-cell lymphoma: Secondary | ICD-10-CM | POA: Diagnosis not present

## 2017-02-28 DIAGNOSIS — R61 Generalized hyperhidrosis: Secondary | ICD-10-CM | POA: Diagnosis not present

## 2017-02-28 DIAGNOSIS — Z6823 Body mass index (BMI) 23.0-23.9, adult: Secondary | ICD-10-CM | POA: Diagnosis not present

## 2017-02-28 DIAGNOSIS — R600 Localized edema: Secondary | ICD-10-CM | POA: Diagnosis not present

## 2017-02-28 DIAGNOSIS — R0902 Hypoxemia: Secondary | ICD-10-CM | POA: Diagnosis not present

## 2017-02-28 DIAGNOSIS — I959 Hypotension, unspecified: Secondary | ICD-10-CM | POA: Diagnosis not present

## 2017-02-28 DIAGNOSIS — R319 Hematuria, unspecified: Secondary | ICD-10-CM | POA: Diagnosis not present

## 2017-02-28 DIAGNOSIS — C8250 Diffuse follicle center lymphoma, unspecified site: Secondary | ICD-10-CM | POA: Diagnosis not present

## 2017-02-28 DIAGNOSIS — J342 Deviated nasal septum: Secondary | ICD-10-CM | POA: Diagnosis not present

## 2017-02-28 DIAGNOSIS — R599 Enlarged lymph nodes, unspecified: Secondary | ICD-10-CM | POA: Diagnosis not present

## 2017-02-28 DIAGNOSIS — R591 Generalized enlarged lymph nodes: Secondary | ICD-10-CM | POA: Diagnosis not present

## 2017-02-28 DIAGNOSIS — I361 Nonrheumatic tricuspid (valve) insufficiency: Secondary | ICD-10-CM | POA: Diagnosis not present

## 2017-02-28 DIAGNOSIS — R5383 Other fatigue: Secondary | ICD-10-CM | POA: Diagnosis not present

## 2017-02-28 DIAGNOSIS — M0609 Rheumatoid arthritis without rheumatoid factor, multiple sites: Secondary | ICD-10-CM | POA: Diagnosis not present

## 2017-02-28 DIAGNOSIS — I10 Essential (primary) hypertension: Secondary | ICD-10-CM | POA: Diagnosis not present

## 2017-02-28 DIAGNOSIS — R140 Abdominal distension (gaseous): Secondary | ICD-10-CM | POA: Diagnosis not present

## 2017-02-28 DIAGNOSIS — R590 Localized enlarged lymph nodes: Secondary | ICD-10-CM | POA: Diagnosis not present

## 2017-02-28 DIAGNOSIS — E785 Hyperlipidemia, unspecified: Secondary | ICD-10-CM | POA: Diagnosis not present

## 2017-02-28 DIAGNOSIS — J90 Pleural effusion, not elsewhere classified: Secondary | ICD-10-CM | POA: Diagnosis not present

## 2017-02-28 DIAGNOSIS — K219 Gastro-esophageal reflux disease without esophagitis: Secondary | ICD-10-CM | POA: Diagnosis present

## 2017-02-28 DIAGNOSIS — C8590 Non-Hodgkin lymphoma, unspecified, unspecified site: Secondary | ICD-10-CM | POA: Diagnosis not present

## 2017-02-28 DIAGNOSIS — L299 Pruritus, unspecified: Secondary | ICD-10-CM | POA: Diagnosis not present

## 2017-02-28 DIAGNOSIS — E883 Tumor lysis syndrome: Secondary | ICD-10-CM | POA: Diagnosis not present

## 2017-02-28 DIAGNOSIS — J189 Pneumonia, unspecified organism: Secondary | ICD-10-CM

## 2017-02-28 DIAGNOSIS — D649 Anemia, unspecified: Secondary | ICD-10-CM | POA: Diagnosis not present

## 2017-02-28 DIAGNOSIS — M6281 Muscle weakness (generalized): Secondary | ICD-10-CM | POA: Diagnosis not present

## 2017-02-28 DIAGNOSIS — Z95828 Presence of other vascular implants and grafts: Secondary | ICD-10-CM | POA: Diagnosis not present

## 2017-02-28 DIAGNOSIS — R5381 Other malaise: Secondary | ICD-10-CM | POA: Diagnosis not present

## 2017-02-28 DIAGNOSIS — Z4682 Encounter for fitting and adjustment of non-vascular catheter: Secondary | ICD-10-CM | POA: Diagnosis not present

## 2017-02-28 DIAGNOSIS — R0602 Shortness of breath: Secondary | ICD-10-CM | POA: Diagnosis not present

## 2017-02-28 DIAGNOSIS — T85528A Displacement of other gastrointestinal prosthetic devices, implants and grafts, initial encounter: Secondary | ICD-10-CM | POA: Diagnosis not present

## 2017-02-28 DIAGNOSIS — Z87891 Personal history of nicotine dependence: Secondary | ICD-10-CM | POA: Diagnosis not present

## 2017-02-28 DIAGNOSIS — Z96651 Presence of right artificial knee joint: Secondary | ICD-10-CM | POA: Diagnosis present

## 2017-02-28 DIAGNOSIS — I898 Other specified noninfective disorders of lymphatic vessels and lymph nodes: Secondary | ICD-10-CM | POA: Diagnosis not present

## 2017-02-28 DIAGNOSIS — N183 Chronic kidney disease, stage 3 (moderate): Secondary | ICD-10-CM | POA: Diagnosis not present

## 2017-02-28 DIAGNOSIS — R498 Other voice and resonance disorders: Secondary | ICD-10-CM | POA: Diagnosis not present

## 2017-02-28 DIAGNOSIS — E440 Moderate protein-calorie malnutrition: Secondary | ICD-10-CM | POA: Diagnosis not present

## 2017-02-28 DIAGNOSIS — R509 Fever, unspecified: Secondary | ICD-10-CM | POA: Diagnosis not present

## 2017-02-28 DIAGNOSIS — D72829 Elevated white blood cell count, unspecified: Secondary | ICD-10-CM | POA: Diagnosis not present

## 2017-02-28 DIAGNOSIS — D899 Disorder involving the immune mechanism, unspecified: Secondary | ICD-10-CM | POA: Diagnosis present

## 2017-02-28 DIAGNOSIS — M069 Rheumatoid arthritis, unspecified: Secondary | ICD-10-CM | POA: Diagnosis present

## 2017-02-28 DIAGNOSIS — Z5111 Encounter for antineoplastic chemotherapy: Secondary | ICD-10-CM | POA: Diagnosis not present

## 2017-02-28 DIAGNOSIS — D487 Neoplasm of uncertain behavior of other specified sites: Secondary | ICD-10-CM | POA: Diagnosis not present

## 2017-02-28 DIAGNOSIS — J343 Hypertrophy of nasal turbinates: Secondary | ICD-10-CM | POA: Diagnosis not present

## 2017-02-28 DIAGNOSIS — M7989 Other specified soft tissue disorders: Secondary | ICD-10-CM | POA: Diagnosis not present

## 2017-02-28 DIAGNOSIS — I82613 Acute embolism and thrombosis of superficial veins of upper extremity, bilateral: Secondary | ICD-10-CM | POA: Diagnosis not present

## 2017-02-28 DIAGNOSIS — R918 Other nonspecific abnormal finding of lung field: Secondary | ICD-10-CM | POA: Diagnosis present

## 2017-02-28 DIAGNOSIS — R278 Other lack of coordination: Secondary | ICD-10-CM | POA: Diagnosis not present

## 2017-02-28 DIAGNOSIS — J181 Lobar pneumonia, unspecified organism: Secondary | ICD-10-CM | POA: Diagnosis present

## 2017-02-28 DIAGNOSIS — I129 Hypertensive chronic kidney disease with stage 1 through stage 4 chronic kidney disease, or unspecified chronic kidney disease: Secondary | ICD-10-CM | POA: Diagnosis not present

## 2017-02-28 DIAGNOSIS — E46 Unspecified protein-calorie malnutrition: Secondary | ICD-10-CM | POA: Diagnosis not present

## 2017-02-28 DIAGNOSIS — B961 Klebsiella pneumoniae [K. pneumoniae] as the cause of diseases classified elsewhere: Secondary | ICD-10-CM | POA: Diagnosis present

## 2017-02-28 DIAGNOSIS — R21 Rash and other nonspecific skin eruption: Secondary | ICD-10-CM | POA: Diagnosis not present

## 2017-02-28 DIAGNOSIS — J9601 Acute respiratory failure with hypoxia: Secondary | ICD-10-CM | POA: Diagnosis not present

## 2017-02-28 DIAGNOSIS — E860 Dehydration: Secondary | ICD-10-CM | POA: Diagnosis not present

## 2017-02-28 DIAGNOSIS — R0981 Nasal congestion: Secondary | ICD-10-CM | POA: Diagnosis not present

## 2017-02-28 DIAGNOSIS — R0603 Acute respiratory distress: Secondary | ICD-10-CM | POA: Diagnosis not present

## 2017-02-28 DIAGNOSIS — R609 Edema, unspecified: Secondary | ICD-10-CM | POA: Diagnosis not present

## 2017-02-28 DIAGNOSIS — N179 Acute kidney failure, unspecified: Secondary | ICD-10-CM | POA: Diagnosis not present

## 2017-02-28 DIAGNOSIS — Z79899 Other long term (current) drug therapy: Secondary | ICD-10-CM | POA: Diagnosis not present

## 2017-02-28 DIAGNOSIS — R69 Illness, unspecified: Secondary | ICD-10-CM | POA: Diagnosis not present

## 2017-02-28 DIAGNOSIS — R2689 Other abnormalities of gait and mobility: Secondary | ICD-10-CM | POA: Diagnosis not present

## 2017-02-28 DIAGNOSIS — C8440 Peripheral T-cell lymphoma, not classified, unspecified site: Secondary | ICD-10-CM | POA: Diagnosis not present

## 2017-02-28 LAB — CBC WITH DIFFERENTIAL/PLATELET
BASOS ABS: 0.1 10*3/uL (ref 0.0–0.1)
Basophils Relative: 1 %
Eosinophils Absolute: 0.3 10*3/uL (ref 0.0–0.7)
Eosinophils Relative: 3 %
HEMATOCRIT: 32.1 % — AB (ref 39.0–52.0)
Hemoglobin: 10.9 g/dL — ABNORMAL LOW (ref 13.0–17.0)
LYMPHS ABS: 1.4 10*3/uL (ref 0.7–4.0)
LYMPHS PCT: 14 %
MCH: 33.3 pg (ref 26.0–34.0)
MCHC: 34 g/dL (ref 30.0–36.0)
MCV: 98.2 fL (ref 78.0–100.0)
MONOS PCT: 4 %
Monocytes Absolute: 0.4 10*3/uL (ref 0.1–1.0)
Neutro Abs: 7.8 10*3/uL — ABNORMAL HIGH (ref 1.7–7.7)
Neutrophils Relative %: 78 %
Platelets: 217 10*3/uL (ref 150–400)
RBC: 3.27 MIL/uL — AB (ref 4.22–5.81)
RDW: 16.4 % — AB (ref 11.5–15.5)
WBC: 10 10*3/uL (ref 4.0–10.5)

## 2017-02-28 LAB — BASIC METABOLIC PANEL
Anion gap: 10 (ref 5–15)
BUN: 29 mg/dL — AB (ref 6–20)
CO2: 21 mmol/L — ABNORMAL LOW (ref 22–32)
Calcium: 8.6 mg/dL — ABNORMAL LOW (ref 8.9–10.3)
Chloride: 103 mmol/L (ref 101–111)
Creatinine, Ser: 1.81 mg/dL — ABNORMAL HIGH (ref 0.61–1.24)
GFR calc Af Amer: 38 mL/min — ABNORMAL LOW (ref >60)
GFR, EST NON AFRICAN AMERICAN: 33 mL/min — AB (ref >60)
Glucose, Bld: 105 mg/dL — ABNORMAL HIGH (ref 65–99)
POTASSIUM: 4.3 mmol/L (ref 3.5–5.1)
SODIUM: 134 mmol/L — AB (ref 135–145)

## 2017-02-28 LAB — PROCALCITONIN: PROCALCITONIN: 0.38 ng/mL

## 2017-02-28 LAB — PROTIME-INR
INR: 1.28
PROTHROMBIN TIME: 15.9 s — AB (ref 11.4–15.2)

## 2017-02-28 LAB — STREP PNEUMONIAE URINARY ANTIGEN: STREP PNEUMO URINARY ANTIGEN: NEGATIVE

## 2017-02-28 MED ORDER — SODIUM CHLORIDE 0.9 % IV BOLUS (SEPSIS)
1000.0000 mL | Freq: Once | INTRAVENOUS | Status: AC
  Administered 2017-02-28: 1000 mL via INTRAVENOUS

## 2017-02-28 MED ORDER — FENTANYL CITRATE (PF) 100 MCG/2ML IJ SOLN
INTRAMUSCULAR | Status: AC
  Filled 2017-02-28: qty 2

## 2017-02-28 MED ORDER — FLUTICASONE PROPIONATE 50 MCG/ACT NA SUSP
1.0000 | Freq: Every day | NASAL | Status: DC
  Administered 2017-02-28 – 2017-03-14 (×13): 1 via NASAL
  Filled 2017-02-28 (×2): qty 16

## 2017-02-28 MED ORDER — MIDAZOLAM HCL 2 MG/2ML IJ SOLN
INTRAMUSCULAR | Status: AC
  Filled 2017-02-28: qty 2

## 2017-02-28 MED ORDER — ALUM & MAG HYDROXIDE-SIMETH 200-200-20 MG/5ML PO SUSP
30.0000 mL | Freq: Four times a day (QID) | ORAL | Status: DC | PRN
  Administered 2017-03-01: 30 mL via ORAL
  Filled 2017-02-28 (×2): qty 30

## 2017-02-28 MED ORDER — OXYMETAZOLINE HCL 0.05 % NA SOLN
1.0000 | Freq: Two times a day (BID) | NASAL | Status: AC | PRN
  Administered 2017-02-28 – 2017-03-01 (×2): 1 via NASAL
  Filled 2017-02-28 (×2): qty 15

## 2017-02-28 MED ORDER — TAMSULOSIN HCL 0.4 MG PO CAPS
0.4000 mg | ORAL_CAPSULE | Freq: Every day | ORAL | Status: DC
  Administered 2017-02-28 – 2017-03-14 (×14): 0.4 mg via ORAL
  Filled 2017-02-28 (×14): qty 1

## 2017-02-28 MED ORDER — ACETAMINOPHEN 325 MG PO TABS
650.0000 mg | ORAL_TABLET | Freq: Four times a day (QID) | ORAL | Status: DC | PRN
  Administered 2017-02-28 (×2): 650 mg via ORAL
  Filled 2017-02-28 (×2): qty 2

## 2017-02-28 MED ORDER — FINASTERIDE 5 MG PO TABS
5.0000 mg | ORAL_TABLET | Freq: Every day | ORAL | Status: DC
  Administered 2017-02-28 – 2017-03-14 (×14): 5 mg via ORAL
  Filled 2017-02-28 (×14): qty 1

## 2017-02-28 MED ORDER — FENTANYL CITRATE (PF) 100 MCG/2ML IJ SOLN
INTRAMUSCULAR | Status: AC | PRN
  Administered 2017-02-28: 25 ug via INTRAVENOUS

## 2017-02-28 MED ORDER — SODIUM CHLORIDE 0.9 % IV SOLN
INTRAVENOUS | Status: DC
  Administered 2017-02-28 (×2): via INTRAVENOUS

## 2017-02-28 MED ORDER — PANTOPRAZOLE SODIUM 40 MG PO TBEC
40.0000 mg | DELAYED_RELEASE_TABLET | Freq: Every day | ORAL | Status: DC
  Administered 2017-03-01 – 2017-03-14 (×13): 40 mg via ORAL
  Filled 2017-02-28 (×13): qty 1

## 2017-02-28 MED ORDER — LIDOCAINE HCL (PF) 1 % IJ SOLN
INTRAMUSCULAR | Status: AC | PRN
  Administered 2017-02-28: 10 mL

## 2017-02-28 MED ORDER — IBUPROFEN 200 MG PO TABS
400.0000 mg | ORAL_TABLET | Freq: Four times a day (QID) | ORAL | Status: DC | PRN
  Administered 2017-02-28 – 2017-03-01 (×3): 400 mg via ORAL
  Filled 2017-02-28 (×3): qty 2

## 2017-02-28 MED ORDER — GUAIFENESIN ER 600 MG PO TB12
600.0000 mg | ORAL_TABLET | Freq: Two times a day (BID) | ORAL | Status: DC | PRN
  Administered 2017-02-28 – 2017-03-01 (×2): 600 mg via ORAL
  Filled 2017-02-28 (×2): qty 1

## 2017-02-28 MED ORDER — LIDOCAINE-EPINEPHRINE (PF) 2 %-1:200000 IJ SOLN
INTRAMUSCULAR | Status: AC
  Filled 2017-02-28: qty 20

## 2017-02-28 MED ORDER — MIDAZOLAM HCL 2 MG/2ML IJ SOLN
INTRAMUSCULAR | Status: AC | PRN
  Administered 2017-02-28: 0.5 mg via INTRAVENOUS

## 2017-02-28 NOTE — Telephone Encounter (Signed)
Patients daughter calling to let you know patient was admitted to hospital. Patient has either Lipoma  or Bronchitis, or both. A biopsy has been done, and they are waiting for results.  Does patient continue with Xeljanx? Please call to advise.

## 2017-02-28 NOTE — Procedures (Signed)
Pre Procedure Dx: Liver mass Post Procedural Dx: Same  Technically successful US guided biopsy of ill defined infiltrative mass involving the GB and extending to the adjacent hepatic parenchyma.   EBL: None  No immediate complications.   Ronny Bacon, MD Pager #: [phone]

## 2017-02-28 NOTE — Procedures (Signed)
Pre Procedure Dx: Left inguinal lymphadenopathy Post Procedural Dx: Same  Technically successful US guided biopsy of indeterminate left inguinal lymph node.  EBL: None  No immediate complications.   Ronny Bacon, MD Pager #: [phone]

## 2017-02-28 NOTE — Telephone Encounter (Signed)
Left message to advise Sahib Pella should hold his [name] and keep [place] updated on his condition.

## 2017-02-28 NOTE — Progress Notes (Addendum)
PROGRESS NOTE    Jose Meza  FAO:[id number] DOB: December 02, 1931 DOA: 02/27/2017 PCP: Lajean Manes, MD   Brief Narrative:  Jose Meza is Jose Meza 84 y.o. male with medical history significant of chronic kidney disease, rheumatoid arthritis chronically on steroids, hypertension transferred here from [facility] after finding use disease in his chest concerning for lymphoma.  Patient arrived there with complaints of coughing for over Loman Logan week with chills and low-grade fevers at home.  Is lost over 20 pounds in the last 4-6 weeks.  He is recently been treated for prostatitis with an unknown antibiotic Kiwana Deblasi month ago.  His symptoms have persisted however despite recent steroid taper for bronchitis also.  He denies any nausea vomiting or diarrhea.  He has not noticed any swollen areas on his body lately.  He denies any rashes.  CTA was done which showed diffuse disease concerning for lymphoma with possible pneumonia.  Patient is referred for admission for hypoxia with 90% O2 sats on room air along with possible pneumonia.  He denies any pain.  Assessment & Plan:   Principal Problem:   PNA (pneumonia) Active Problems:   Hyperlipidemia   Hypertension   CKD (chronic kidney disease) stage 3, GFR 30-59 ml/min (HCC)   Rheumatoid arthritis of multiple sites with negative rheumatoid factor (HCC)  Acute Hypoxic Respiratory Failure:  Currently being treated for community acquired pneumonia, but patient also with widepread lymphadenopathy and pulmonary nodules and interlobular septal thickening throughout lungs which could be contributing.  Immunocompromised with treatment for RA on xeljanz, recently treated with steroids as well for past few days.  Of note, neg quant gold 10/2016 and denies risk factors for exposure.    Ceftriaxone and azithromycin (2/13 - )  Sputum cx pending, urine strep negative, urine legionella pending Negative influenza RVP pending PCT 0.38, follow   Diffuse Lymphadenopathy   Weight Loss  Fevers: imaging findings concerning for lymphoma per radiology.  He notes weight loss and low grade temps over past several months.  Denies night sweats.  Also on differential based on imaging findings is metastatic primary bronchogenic carcinoma.    S/p biopsy of inguinal LN by IR Will follow results, c/s oncology pending results and symptoms  Nutrition  UTI: UA nitrite positive, bacteria, and 6-30 WBC, though with positive squams.  Follow cx.  Continue ceftriaxone. Hx of prostatitis.   Tachycardia: noted this morning, sinus.  EKG appeared similar to priors.  Bolus and IVF. Place on telemetry.  Continue metop.   Anemia: slightly downtrended, likely 2/2 IVF.  Continue to monitor  Rheumatoid Arthritis:  Hold xeljanz  CKD Stage III: baseline creatinine looks to be between 1.5-1.7. Mildly elevated today to 1.81.  Continue to monitor.   HTN: metoprolol  GERD: PPI  Anxiety: holding ativan for now with above (he takes this prn, not always daily)  Ectatic Infrarenal Abdominal Aorta: follow up as noted per rads  DVT prophylaxis: SCDs Code Status: full code Family Communication: discussed with daughter Disposition Plan: pending improvement in resp status and bx results   Consultants:   IR  Procedures:   US Guided Biopsy of L inguinal LN 02/28/17  Antimicrobials:  Anti-infectives (From admission, onward)   Start     Dose/Rate Route Frequency Ordered Stop   02/28/17 1800  cefTRIAXone (ROCEPHIN) 1 g in sodium chloride 0.9 % 100 mL IVPB     1 g 200 mL/hr over 30 Minutes Intravenous Every 24 hours 02/27/17 2205 03/07/17 1759   02/28/17 1800  azithromycin (ZITHROMAX) 500 mg in sodium chloride 0.9 % 250 mL IVPB     500 mg 250 mL/hr over 60 Minutes Intravenous Every 24 hours 02/27/17 2205 03/07/17 1759   02/27/17 1824  azithromycin (ZITHROMAX) 500 MG injection    Comments:  Peel, Adrienne   : cabinet override      02/27/17 1824 02/28/17 0629   02/27/17 1800  cefTRIAXone  (ROCEPHIN) 1 g in sodium chloride 0.9 % 100 mL IVPB     1 g 200 mL/hr over 30 Minutes Intravenous  Once 02/27/17 1757 02/27/17 1905   02/27/17 1800  azithromycin (ZITHROMAX) 500 mg in sodium chloride 0.9 % 250 mL IVPB     500 mg 250 mL/hr over 60 Minutes Intravenous  Once 02/27/17 1757 02/27/17 2034     Subjective: Sx present for months. Low grade temps, chills.  No night sweats.  Loss of 15 lbs.  Decreased appetite.  Objective: Vitals:   02/27/17 2150 02/27/17 2210 02/28/17 0517 02/28/17 0818  BP: 125/64  (!) 114/46 (!) 126/56  Pulse: (!) 118  (!) 107 (!) 135  Resp: (!) 22  (!) 21 20  Temp: (!) 97.4 F (36.3 C)  97.7 F (36.5 C) 100 F (37.8 C)  TempSrc: Oral  Oral Oral  SpO2: 95%  100% 96%  Weight:  90.8 kg (200 lb 2.8 oz)    Height:  6\' 5"  (1.956 m)      Intake/Output Summary (Last 24 hours) at 02/28/2017 0840 Last data filed at 02/28/2017 0714 Gross per 24 hour  Intake 1100 ml  Output 650 ml  Net 450 ml   Filed Weights   02/27/17 1503 02/27/17 2210  Weight: 88 kg (194 lb) 90.8 kg (200 lb 2.8 oz)    Examination:  General exam: Appears calm and comfortable  Respiratory system: Clear to auscultation. Respiratory effort normal. Cardiovascular system: S1 & S2 heard, RRR. No JVD, murmurs, rubs, gallops or clicks. No pedal edema. Gastrointestinal system: Abdomen is nondistended, soft and nontender. No organomegaly or masses felt. Normal bowel sounds heard. Central nervous system: Alert and oriented. No focal neurological deficits. Extremities: Symmetric 5 x 5 power. Skin: No rashes, lesions or ulcers Psychiatry: Judgement and insight appear normal. Mood & affect appropriate.     Data Reviewed: I have personally reviewed following labs and imaging studies  CBC: Recent Labs  Lab 02/27/17 1514 02/28/17 0423  WBC 11.6* 10.0  NEUTROABS 9.0* 7.8*  HGB 12.2* 10.9*  HCT 35.3* 32.1*  MCV 96.7 98.2  PLT 207 397   Basic Metabolic Panel: Recent Labs  Lab  02/27/17 1514 02/28/17 0423  NA 129* 134*  K 4.2 4.3  CL 99* 103  CO2 19* 21*  GLUCOSE 130* 105*  BUN 30* 29*  CREATININE 1.72* 1.81*  CALCIUM 9.0 8.6*   GFR: Estimated Creatinine Clearance: 39 mL/min (Tajanae Guilbault) (by C-G formula based on SCr of 1.81 mg/dL (H)). Liver Function Tests: Recent Labs  Lab 02/27/17 1514  AST 29  ALT 25  ALKPHOS 59  BILITOT 0.4  PROT 7.3  ALBUMIN 2.7*   Recent Labs  Lab 02/27/17 1514  LIPASE 46   No results for input(s): AMMONIA in the last 168 hours. Coagulation Profile: Recent Labs  Lab 02/28/17 0423  INR 1.28   Cardiac Enzymes: Recent Labs  Lab 02/27/17 1514  TROPONINI <0.03   BNP (last 3 results) No results for input(s): PROBNP in the last 8760 hours. HbA1C: No results for input(s): HGBA1C in the last 72 hours.  CBG: No results for input(s): GLUCAP in the last 168 hours. Lipid Profile: No results for input(s): CHOL, HDL, LDLCALC, TRIG, CHOLHDL, LDLDIRECT in the last 72 hours. Thyroid Function Tests: Recent Labs    02/27/17 1940  TSH 6.452*   Anemia Panel: No results for input(s): VITAMINB12, FOLATE, FERRITIN, TIBC, IRON, RETICCTPCT in the last 72 hours. Sepsis Labs: Recent Labs  Lab 02/27/17 1814  LATICACIDVEN 1.29    No results found for this or any previous visit (from the past 240 hour(s)).       Radiology Studies: Dg Chest 2 View  Result Date: 02/27/2017 CLINICAL DATA:  Cough for 2 months EXAM: CHEST  2 VIEW COMPARISON:  01/18/2017 FINDINGS: Cardiac shadow is stable. The lungs are well aerated bilaterally. Diffuse interstitial changes are noted as well as fullness in the region of the right hilum consistent with acute infiltrate. No sizable effusion is seen. No bony abnormality is noted. IMPRESSION: Right basilar infiltrate as well as some superimposed interstitial change new from the prior exam. Continued follow-up is recommended. Electronically Signed   By: Inez Catalina M.D.   On: 02/27/2017 15:39   Ct Angio Chest  Pe W And/or Wo Contrast  Result Date: 02/27/2017 CLINICAL DATA:  Cough.  Weight loss.  Abdominal pain. EXAM: CT ANGIOGRAPHY CHEST CT ABDOMEN AND PELVIS WITH CONTRAST TECHNIQUE: Multidetector CT imaging of the chest was performed using the standard protocol during bolus administration of intravenous contrast. Multiplanar CT image reconstructions and MIPs were obtained to evaluate the vascular anatomy. Multidetector CT imaging of the abdomen and pelvis was performed using the standard protocol during bolus administration of intravenous contrast. CONTRAST:  15mL ISOVUE-370 IOPAMIDOL (ISOVUE-370) INJECTION 76% COMPARISON:  Chest radiograph from earlier today. 02/25/2015 renal sonogram. FINDINGS: CTA CHEST FINDINGS Cardiovascular: The study is low to moderate quality for the evaluation of pulmonary embolism, with evaluation of the segmental and subsegmental pulmonary artery branches significantly limited by motion artifact. There are no convincing filling defects in the central, lobar, segmental or subsegmental pulmonary artery branches to suggest acute pulmonary embolism. Atherosclerotic nonaneurysmal thoracic aorta. Dilated main pulmonary artery (3.8 cm diameter). Normal heart size. No significant pericardial fluid/thickening. Left anterior descending and right coronary atherosclerosis. Mediastinum/Nodes: No discrete thyroid nodules. Mildly patulous and otherwise grossly normal esophagus. Mild bilateral axillary adenopathy measuring up to 1.1 cm on the left (series 5/image 23) and 1.0 cm on the right (series 5/image 30). Mildly enlarged level 2 neck lymph nodes bilaterally, largest 1.1 cm on the right (series 5/image 3). Right paratracheal adenopathy measuring up to 1.7 cm (series 5/image 45). Enlarged 1.8 cm subcarinal node (series 5/image 54). AP window adenopathy measuring up to 1.3 cm (series 5/image 46). Enlarged left prevascular anterior mediastinal nodes up to 1.1 cm (series 5/image 43). Prominently enlarged  2.8 cm right hilar node (series 5/image 56). Enlarged left hilar nodes up to 1.4 cm (series 5/image 57). Lungs/Pleura: No pneumothorax. Small dependent bilateral pleural effusions. Prominent interlobular septal thickening and patchy ground-glass attenuation throughout both lungs. Right infrahilar 3.9 x 2.4 cm mass/adenopathy (series 5/image 71). Numerous poorly marginated subsolid pulmonary nodules throughout both lungs, for example Anjelita Sheahan 1.8 cm inferior right middle lobe nodule (series 4/image 92), Brain Honeycutt 2.7 cm posterior right lower lobe nodule (series 4/image 70) and Jearl Soto 0.8 cm left upper lobe nodule (series 4/image 36). Musculoskeletal: No aggressive appearing focal osseous lesions. Marked thoracic spondylosis. Review of the MIP images confirms the above findings. CT ABDOMEN and PELVIS FINDINGS Motion degraded scan. Hepatobiliary: Normal liver size.  Tashae Inda few scattered subcentimeter hypodense lesions throughout the liver, too small to characterize. Layering cholelithiasis with no gallbladder wall thickening or pericholecystic fluid. No biliary ductal dilatation. Pancreas: Normal, with no mass or duct dilation. Spleen: Normal size. No mass. Adrenals/Urinary Tract: Normal adrenals. Nonobstructing left nephrolithiasis measuring 12 mm in the interpolar left kidney and 10 mm in the lower left kidney. No hydronephrosis. Multiple simple bilateral renal cysts, largest 6.7 cm in the upper right kidney and 5.4 cm in the interpolar left kidney. Additional subcentimeter hypodense renal cortical lesions in both kidneys are too small to characterize and require no follow-up. Mild diffuse bladder trabeculation with Jaine Estabrooks few small bladder diverticula, largest 2.2 cm in the anterior right bladder wall. Stomach/Bowel: Normal non-distended stomach. Normal caliber small bowel with no small bowel wall thickening. Appendix not discretely visualized. No pericecal inflammatory changes. Normal large bowel with no diverticulosis, large bowel wall  thickening or pericolonic fat stranding. Vascular/Lymphatic: Atherosclerotic abdominal aorta with ectatic 2.6 cm infrarenal abdominal aorta. Patent portal, splenic, hepatic and renal veins. Left para-aortic adenopathy measuring up to 1.1 cm (series 16/image 19). Enlarged 1.2 cm aortocaval node (series 16/image 21). Bilateral common iliac, external iliac and borderline inguinal lymphadenopathy. Representative enlarged 1.8 cm left common iliac node (series 16/image 29). Representative enlarged 2.3 cm left external iliac node (series 16/image 44). Representative borderline enlarged 1.3 cm left inguinal node (series 16/image 51). Mildly enlarged bilateral upper retroperitoneal nodes between the adrenal glands and diaphragmatic cru, largest 1.4 cm on the right (series 5/image 98). Reproductive: Markedly enlarged prostate with nonspecific internal prostatic calcifications. Other: No pneumoperitoneum, ascites or focal fluid collection. Musculoskeletal: No aggressive appearing focal osseous lesions. Marked lumbar spondylosis. Nonspecific subcentimeter sclerotic foci in L1, L3 and L4 vertebral bodies. Review of the MIP images confirms the above findings. IMPRESSION: 1. Motion degraded scan.  No evidence of pulmonary embolism. 2. Widespread lymphadenopathy involving the bilateral neck, bilateral axilla, bilateral mediastinum, bilateral hilum, retroperitoneum and bilateral pelvis. Poorly marginated pulmonary nodules and interlobular septal thickening throughout both lungs. Lymphoma is favored. Widely metastatic primary bronchogenic carcinoma is on the differential given the asymmetrically bulky right hilar/right infrahilar adenopathy, but is considered less likely. 3. Small dependent bilateral pleural effusions. 4. Dilated main pulmonary artery, suggesting pulmonary arterial hypertension. 5. Markedly enlarged prostate. Diffuse bladder trabeculation and small bladder diverticula compatible with chronic bladder outlet  obstruction. Nonobstructing left nephrolithiasis. No hydronephrosis. 6. Aortic Atherosclerosis (ICD10-I70.0). Ectatic infrarenal 2.6 cm abdominal aorta at risk for aneurysm development. Recommend followup by ultrasound in 5 years. This recommendation follows ACR consensus guidelines: White Paper of the ACR Incidental Findings Committee II on Vascular Findings. J Am Coll Radiol 2013; 10:789-794. 7. Cholelithiasis. Electronically Signed   By: Ilona Sorrel M.D.   On: 02/27/2017 17:42   Ct Abdomen Pelvis W Contrast  Result Date: 02/27/2017 CLINICAL DATA:  Cough.  Weight loss.  Abdominal pain. EXAM: CT ANGIOGRAPHY CHEST CT ABDOMEN AND PELVIS WITH CONTRAST TECHNIQUE: Multidetector CT imaging of the chest was performed using the standard protocol during bolus administration of intravenous contrast. Multiplanar CT image reconstructions and MIPs were obtained to evaluate the vascular anatomy. Multidetector CT imaging of the abdomen and pelvis was performed using the standard protocol during bolus administration of intravenous contrast. CONTRAST:  65mL ISOVUE-370 IOPAMIDOL (ISOVUE-370) INJECTION 76% COMPARISON:  Chest radiograph from earlier today. 02/25/2015 renal sonogram. FINDINGS: CTA CHEST FINDINGS Cardiovascular: The study is low to moderate quality for the evaluation of pulmonary embolism, with evaluation of the segmental and subsegmental pulmonary artery  branches significantly limited by motion artifact. There are no convincing filling defects in the central, lobar, segmental or subsegmental pulmonary artery branches to suggest acute pulmonary embolism. Atherosclerotic nonaneurysmal thoracic aorta. Dilated main pulmonary artery (3.8 cm diameter). Normal heart size. No significant pericardial fluid/thickening. Left anterior descending and right coronary atherosclerosis. Mediastinum/Nodes: No discrete thyroid nodules. Mildly patulous and otherwise grossly normal esophagus. Mild bilateral axillary adenopathy  measuring up to 1.1 cm on the left (series 5/image 23) and 1.0 cm on the right (series 5/image 30). Mildly enlarged level 2 neck lymph nodes bilaterally, largest 1.1 cm on the right (series 5/image 3). Right paratracheal adenopathy measuring up to 1.7 cm (series 5/image 45). Enlarged 1.8 cm subcarinal node (series 5/image 54). AP window adenopathy measuring up to 1.3 cm (series 5/image 46). Enlarged left prevascular anterior mediastinal nodes up to 1.1 cm (series 5/image 43). Prominently enlarged 2.8 cm right hilar node (series 5/image 56). Enlarged left hilar nodes up to 1.4 cm (series 5/image 57). Lungs/Pleura: No pneumothorax. Small dependent bilateral pleural effusions. Prominent interlobular septal thickening and patchy ground-glass attenuation throughout both lungs. Right infrahilar 3.9 x 2.4 cm mass/adenopathy (series 5/image 71). Numerous poorly marginated subsolid pulmonary nodules throughout both lungs, for example Brinlynn Gorton 1.8 cm inferior right middle lobe nodule (series 4/image 92), Marlow Berenguer 2.7 cm posterior right lower lobe nodule (series 4/image 70) and Elion Hocker 0.8 cm left upper lobe nodule (series 4/image 36). Musculoskeletal: No aggressive appearing focal osseous lesions. Marked thoracic spondylosis. Review of the MIP images confirms the above findings. CT ABDOMEN and PELVIS FINDINGS Motion degraded scan. Hepatobiliary: Normal liver size. Gwynn Chalker few scattered subcentimeter hypodense lesions throughout the liver, too small to characterize. Layering cholelithiasis with no gallbladder wall thickening or pericholecystic fluid. No biliary ductal dilatation. Pancreas: Normal, with no mass or duct dilation. Spleen: Normal size. No mass. Adrenals/Urinary Tract: Normal adrenals. Nonobstructing left nephrolithiasis measuring 12 mm in the interpolar left kidney and 10 mm in the lower left kidney. No hydronephrosis. Multiple simple bilateral renal cysts, largest 6.7 cm in the upper right kidney and 5.4 cm in the interpolar left kidney.  Additional subcentimeter hypodense renal cortical lesions in both kidneys are too small to characterize and require no follow-up. Mild diffuse bladder trabeculation with Edda Orea few small bladder diverticula, largest 2.2 cm in the anterior right bladder wall. Stomach/Bowel: Normal non-distended stomach. Normal caliber small bowel with no small bowel wall thickening. Appendix not discretely visualized. No pericecal inflammatory changes. Normal large bowel with no diverticulosis, large bowel wall thickening or pericolonic fat stranding. Vascular/Lymphatic: Atherosclerotic abdominal aorta with ectatic 2.6 cm infrarenal abdominal aorta. Patent portal, splenic, hepatic and renal veins. Left para-aortic adenopathy measuring up to 1.1 cm (series 16/image 19). Enlarged 1.2 cm aortocaval node (series 16/image 21). Bilateral common iliac, external iliac and borderline inguinal lymphadenopathy. Representative enlarged 1.8 cm left common iliac node (series 16/image 29). Representative enlarged 2.3 cm left external iliac node (series 16/image 44). Representative borderline enlarged 1.3 cm left inguinal node (series 16/image 51). Mildly enlarged bilateral upper retroperitoneal nodes between the adrenal glands and diaphragmatic cru, largest 1.4 cm on the right (series 5/image 98). Reproductive: Markedly enlarged prostate with nonspecific internal prostatic calcifications. Other: No pneumoperitoneum, ascites or focal fluid collection. Musculoskeletal: No aggressive appearing focal osseous lesions. Marked lumbar spondylosis. Nonspecific subcentimeter sclerotic foci in L1, L3 and L4 vertebral bodies. Review of the MIP images confirms the above findings. IMPRESSION: 1. Motion degraded scan.  No evidence of pulmonary embolism. 2. Widespread lymphadenopathy involving the bilateral neck,  bilateral axilla, bilateral mediastinum, bilateral hilum, retroperitoneum and bilateral pelvis. Poorly marginated pulmonary nodules and interlobular septal  thickening throughout both lungs. Lymphoma is favored. Widely metastatic primary bronchogenic carcinoma is on the differential given the asymmetrically bulky right hilar/right infrahilar adenopathy, but is considered less likely. 3. Small dependent bilateral pleural effusions. 4. Dilated main pulmonary artery, suggesting pulmonary arterial hypertension. 5. Markedly enlarged prostate. Diffuse bladder trabeculation and small bladder diverticula compatible with chronic bladder outlet obstruction. Nonobstructing left nephrolithiasis. No hydronephrosis. 6. Aortic Atherosclerosis (ICD10-I70.0). Ectatic infrarenal 2.6 cm abdominal aorta at risk for aneurysm development. Recommend followup by ultrasound in 5 years. This recommendation follows ACR consensus guidelines: White Paper of the ACR Incidental Findings Committee II on Vascular Findings. J Am Coll Radiol 2013; 10:789-794. 7. Cholelithiasis. Electronically Signed   By: Ilona Sorrel M.D.   On: 02/27/2017 17:42        Scheduled Meds: . metoprolol succinate  100 mg Oral Daily  . sodium chloride flush  3 mL Intravenous Q12H   Continuous Infusions: . sodium chloride    . sodium chloride    . azithromycin    . cefTRIAXone (ROCEPHIN)  IV    . sodium chloride 1,000 mL (02/28/17 0839)     LOS: 0 days    Time spent: over 30 min    Fayrene Helper, MD [facility] Pager [phone]  If 7PM-7AM, please contact night-coverage [email or web address] Password [email or web address] 02/28/2017, 8:40 AM

## 2017-02-28 NOTE — Care Management Obs Status (Signed)
[facility] NOTIFICATION   Patient Details  Name: Jose Meza MRN: [id number] Date of Birth: Dec 26, 1931   Medicare Observation Status Notification Given:  Yes    Lynnell Catalan, RN 02/28/2017, 1:31 PM

## 2017-02-28 NOTE — Progress Notes (Signed)
MD notified of pt's HR and low grade temp. Waiting for orders at this time.

## 2017-02-28 NOTE — Consult Note (Signed)
Chief Complaint: Patient was seen in consultation today for ultrasound-guided left inguinal lymph node biopsy  Referring Physician(s): David,R  Supervising Physician: Sandi Mariscal  Patient Status: [facility] - In-pt  History of Present Illness: Jose Meza is a 84 y.o. male with history of chronic kidney disease, rheumatoid arthritis, hypertension, recently treated prostatitis who was admitted to [facility] yesterday with fatigue, cough, chills, fever, weight loss and imaging findings of widespread lymphadenopathy concerning for lymphoma.  Request now received for image guided lymph node biopsy for further evaluation.  Past Medical History:  Diagnosis Date  . Allergic rhinitis, seasonal   . Arthritis    neg rheum eval  . BPH (benign prostatic hypertrophy)    recurrent prostatitis, hx BOO  . Cauda equina, syndrome   . Chronic kidney disease    stage 2-3  . DJD (degenerative joint disease) of knee    right knee, s/p knee replacement 12/06  . GERD (gastroesophageal reflux disease)   . Hyperlipidemia   . Hypertension   . IBS (irritable bowel syndrome)    constipation prone  . Incomplete RBBB    ECG 05/08/13  . Polyp of colon, adenomatous 07/2009   buccini  . Rheumatoid arthritis [facility])     Past Surgical History:  Procedure Laterality Date  . JOINT REPLACEMENT  12/2004   R TKR  . KNEE ARTHROPLASTY    . Right knee replacement  2007  . [place]  . Ulner Tranpos  2009    Allergies: Sulfa antibiotics  Medications: Prior to Admission medications   Medication Sig Start Date End Date Taking? Authorizing Provider  atorvastatin (LIPITOR) 10 MG tablet Take 1 tablet (10 mg total) by mouth daily. 06/21/14  Yes Rowe Clack, MD  b complex vitamins tablet Take 1 tablet by mouth daily.   Yes [provider]  carisoprodol (SOMA) 350 MG tablet Take 350 mg by mouth daily as needed for muscle spasms.   Yes [provider]  cholecalciferol (VITAMIN D) 1000  UNITS tablet Take 1,000 Units by mouth daily.   Yes [provider]  esomeprazole (NEXIUM) 40 MG capsule Take 1 capsule (40 mg total) by mouth at bedtime. Patient taking differently: Take 40 mg by mouth as needed.  03/22/14  Yes Rowe Clack, MD  famotidine (PEPCID AC) 10 MG chewable tablet Chew 10 mg by mouth as needed.    Yes [provider]  finasteride (PROSCAR) 5 MG tablet Take 5 mg by mouth daily.     Yes [provider]  fluticasone (FLONASE) 50 MCG/ACT nasal spray Place into both nostrils daily.   Yes [provider]  glucosamine-chondroitin 500-400 MG tablet Take 1 tablet by mouth 3 (three) times daily.   Yes [provider]  LORazepam (ATIVAN) 0.5 MG tablet as needed. 11/09/16  Yes [provider]  Lutein 20 MG CAPS Take 20 mg by mouth daily.   Yes [provider]  metoprolol succinate (TOPROL-XL) 100 MG 24 hr tablet Take 1 tablet (100 mg total) by mouth daily. 06/04/14  Yes Rowe Clack, MD  montelukast (SINGULAIR) 10 MG tablet  11/12/16  Yes [provider]  tamsulosin (FLOMAX) 0.4 MG CAPS capsule Take 1 capsule (0.4 mg total) by mouth daily. 04/09/13  Yes Lawyer, Harrell Gave, PA-C  Tofacitinib Citrate (XELJANZ) 5 MG TABS Take 1 tablet by mouth daily. 02/13/17  Yes Deveshwar, Abel Presto, MD  vitamin C (ASCORBIC ACID) 500 MG tablet Take 4,000 mg by mouth daily.  Yes [provider]  zolpidem (AMBIEN CR) 12.5 MG CR tablet Take 12.5 mg by mouth at bedtime as needed for sleep.   Yes [provider]  amitriptyline (ELAVIL) 10 MG tablet Take 1 tablet (10 mg total) by mouth at bedtime. 05/20/13 10/27/15  Rowe Clack, MD  meprobamate (EQUANIL) 200 MG tablet Take 2 tablets (400 mg total) by mouth at bedtime as needed (pain.). Patient not taking: Reported on 04/27/2016 06/02/14   Rowe Clack, MD  zolpidem (AMBIEN) 5 MG tablet Take 1 tablet (5 mg total) by mouth at bedtime as needed for  sleep. Patient not taking: Reported on 02/27/2017 07/08/14   Rowe Clack, MD     Family History  Problem Relation Age of Onset  . Colon cancer Mother     Social History   Socioeconomic History  . Marital status: Widowed    Spouse name: None  . Number of children: None  . Years of education: None  . Highest education level: None  Social Needs  . Financial resource strain: None  . Food insecurity - worry: None  . Food insecurity - inability: None  . Transportation needs - medical: None  . Transportation needs - non-medical: None  Occupational History  . None  Tobacco Use  . Smoking status: Former Smoker    Packs/day: 2.00    Years: 7.00    Pack years: 14.00    Types: Cigarettes    Last attempt to quit: 01/16/1956    Years since quitting: 61.1  . Smokeless tobacco: Never Used  Substance and Sexual Activity  . Alcohol use: No    Frequency: Never  . Drug use: No  . Sexual activity: None  Other Topics Concern  . 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  Chest with sl diminished breath sounds bases.  Heart with tachycardic but regular rhythm; abdomen soft, positive bowel sounds, currently nontender.  No lower extremity edema.  Left inguinal adenopathy noted Imaging: Dg Chest 2 View  Result Date: 02/27/2017 CLINICAL DATA:  Cough for 2 months EXAM: CHEST  2 VIEW COMPARISON:  01/18/2017 FINDINGS: Cardiac shadow is stable. The lungs are well aerated bilaterally. Diffuse interstitial changes are noted as well as fullness in the region of the right hilum consistent with acute infiltrate. No sizable  effusion is seen. No bony abnormality is noted. IMPRESSION: Right basilar infiltrate as well as some superimposed interstitial change new from the prior exam. Continued follow-up is recommended. Electronically Signed   By: Inez Catalina M.D.   On: 02/27/2017 15:39   Ct Angio Chest Pe W And/or Wo Contrast  Result Date: 02/27/2017 CLINICAL DATA:  Cough.  Weight loss.  Abdominal pain. EXAM: CT ANGIOGRAPHY CHEST CT ABDOMEN AND PELVIS WITH CONTRAST TECHNIQUE: Multidetector CT imaging of the chest was performed using the standard protocol during bolus administration of intravenous contrast. Multiplanar CT image reconstructions and MIPs were obtained to evaluate the vascular anatomy. Multidetector CT imaging  of the abdomen and pelvis was performed using the standard protocol during bolus administration of intravenous contrast. CONTRAST:  39mL ISOVUE-370 IOPAMIDOL (ISOVUE-370) INJECTION 76% COMPARISON:  Chest radiograph from earlier today. 02/25/2015 renal sonogram. FINDINGS: CTA CHEST FINDINGS Cardiovascular: The study is low to moderate quality for the evaluation of pulmonary embolism, with evaluation of the segmental and subsegmental pulmonary artery branches significantly limited by motion artifact. There are no convincing filling defects in the central, lobar, segmental or subsegmental pulmonary artery branches to suggest acute pulmonary embolism. Atherosclerotic nonaneurysmal thoracic aorta. Dilated main pulmonary artery (3.8 cm diameter). Normal heart size. No significant pericardial fluid/thickening. Left anterior descending and right coronary atherosclerosis. Mediastinum/Nodes: No discrete thyroid nodules. Mildly patulous and otherwise grossly normal esophagus. Mild bilateral axillary adenopathy measuring up to 1.1 cm on the left (series 5/image 23) and 1.0 cm on the right (series 5/image 30). Mildly enlarged level 2 neck lymph nodes bilaterally, largest 1.1 cm on the right (series 5/image 3). Right paratracheal  adenopathy measuring up to 1.7 cm (series 5/image 45). Enlarged 1.8 cm subcarinal node (series 5/image 54). AP window adenopathy measuring up to 1.3 cm (series 5/image 46). Enlarged left prevascular anterior mediastinal nodes up to 1.1 cm (series 5/image 43). Prominently enlarged 2.8 cm right hilar node (series 5/image 56). Enlarged left hilar nodes up to 1.4 cm (series 5/image 57). Lungs/Pleura: No pneumothorax. Small dependent bilateral pleural effusions. Prominent interlobular septal thickening and patchy ground-glass attenuation throughout both lungs. Right infrahilar 3.9 x 2.4 cm mass/adenopathy (series 5/image 71). Numerous poorly marginated subsolid pulmonary nodules throughout both lungs, for example a 1.8 cm inferior right middle lobe nodule (series 4/image 92), a 2.7 cm posterior right lower lobe nodule (series 4/image 70) and a 0.8 cm left upper lobe nodule (series 4/image 36). Musculoskeletal: No aggressive appearing focal osseous lesions. Marked thoracic spondylosis. Review of the MIP images confirms the above findings. CT ABDOMEN and PELVIS FINDINGS Motion degraded scan. Hepatobiliary: Normal liver size. A few scattered subcentimeter hypodense lesions throughout the liver, too small to characterize. Layering cholelithiasis with no gallbladder wall thickening or pericholecystic fluid. No biliary ductal dilatation. Pancreas: Normal, with no mass or duct dilation. Spleen: Normal size. No mass. Adrenals/Urinary Tract: Normal adrenals. Nonobstructing left nephrolithiasis measuring 12 mm in the interpolar left kidney and 10 mm in the lower left kidney. No hydronephrosis. Multiple simple bilateral renal cysts, largest 6.7 cm in the upper right kidney and 5.4 cm in the interpolar left kidney. Additional subcentimeter hypodense renal cortical lesions in both kidneys are too small to characterize and require no follow-up. Mild diffuse bladder trabeculation with a few small bladder diverticula, largest 2.2 cm in  the anterior right bladder wall. Stomach/Bowel: Normal non-distended stomach. Normal caliber small bowel with no small bowel wall thickening. Appendix not discretely visualized. No pericecal inflammatory changes. Normal large bowel with no diverticulosis, large bowel wall thickening or pericolonic fat stranding. Vascular/Lymphatic: Atherosclerotic abdominal aorta with ectatic 2.6 cm infrarenal abdominal aorta. Patent portal, splenic, hepatic and renal veins. Left para-aortic adenopathy measuring up to 1.1 cm (series 16/image 19). Enlarged 1.2 cm aortocaval node (series 16/image 21). Bilateral common iliac, external iliac and borderline inguinal lymphadenopathy. Representative enlarged 1.8 cm left common iliac node (series 16/image 29). Representative enlarged 2.3 cm left external iliac node (series 16/image 44). Representative borderline enlarged 1.3 cm left inguinal node (series 16/image 51). Mildly enlarged bilateral upper retroperitoneal nodes between the adrenal glands and diaphragmatic cru, largest 1.4 cm on the right (series 5/image 98). Reproductive: Markedly enlarged  prostate with nonspecific internal prostatic calcifications. Other: No pneumoperitoneum, ascites or focal fluid collection. Musculoskeletal: No aggressive appearing focal osseous lesions. Marked lumbar spondylosis. Nonspecific subcentimeter sclerotic foci in L1, L3 and L4 vertebral bodies. Review of the MIP images confirms the above findings. IMPRESSION: 1. Motion degraded scan.  No evidence of pulmonary embolism. 2. Widespread lymphadenopathy involving the bilateral neck, bilateral axilla, bilateral mediastinum, bilateral hilum, retroperitoneum and bilateral pelvis. Poorly marginated pulmonary nodules and interlobular septal thickening throughout both lungs. Lymphoma is favored. Widely metastatic primary bronchogenic carcinoma is on the differential given the asymmetrically bulky right hilar/right infrahilar adenopathy, but is considered less  likely. 3. Small dependent bilateral pleural effusions. 4. Dilated main pulmonary artery, suggesting pulmonary arterial hypertension. 5. Markedly enlarged prostate. Diffuse bladder trabeculation and small bladder diverticula compatible with chronic bladder outlet obstruction. Nonobstructing left nephrolithiasis. No hydronephrosis. 6. Aortic Atherosclerosis (ICD10-I70.0). Ectatic infrarenal 2.6 cm abdominal aorta at risk for aneurysm development. Recommend followup by ultrasound in 5 years. This recommendation follows ACR consensus guidelines: White Paper of the ACR Incidental Findings Committee II on Vascular Findings. J Am Coll Radiol 2013; 10:789-794. 7. Cholelithiasis. Electronically Signed   By: Ilona Sorrel M.D.   On: 02/27/2017 17:42   Ct Abdomen Pelvis W Contrast  Result Date: 02/27/2017 CLINICAL DATA:  Cough.  Weight loss.  Abdominal pain. EXAM: CT ANGIOGRAPHY CHEST CT ABDOMEN AND PELVIS WITH CONTRAST TECHNIQUE: Multidetector CT imaging of the chest was performed using the standard protocol during bolus administration of intravenous contrast. Multiplanar CT image reconstructions and MIPs were obtained to evaluate the vascular anatomy. Multidetector CT imaging of the abdomen and pelvis was performed using the standard protocol during bolus administration of intravenous contrast. CONTRAST:  32mL ISOVUE-370 IOPAMIDOL (ISOVUE-370) INJECTION 76% COMPARISON:  Chest radiograph from earlier today. 02/25/2015 renal sonogram. FINDINGS: CTA CHEST FINDINGS Cardiovascular: The study is low to moderate quality for the evaluation of pulmonary embolism, with evaluation of the segmental and subsegmental pulmonary artery branches significantly limited by motion artifact. There are no convincing filling defects in the central, lobar, segmental or subsegmental pulmonary artery branches to suggest acute pulmonary embolism. Atherosclerotic nonaneurysmal thoracic aorta. Dilated main pulmonary artery (3.8 cm diameter). Normal  heart size. No significant pericardial fluid/thickening. Left anterior descending and right coronary atherosclerosis. Mediastinum/Nodes: No discrete thyroid nodules. Mildly patulous and otherwise grossly normal esophagus. Mild bilateral axillary adenopathy measuring up to 1.1 cm on the left (series 5/image 23) and 1.0 cm on the right (series 5/image 30). Mildly enlarged level 2 neck lymph nodes bilaterally, largest 1.1 cm on the right (series 5/image 3). Right paratracheal adenopathy measuring up to 1.7 cm (series 5/image 45). Enlarged 1.8 cm subcarinal node (series 5/image 54). AP window adenopathy measuring up to 1.3 cm (series 5/image 46). Enlarged left prevascular anterior mediastinal nodes up to 1.1 cm (series 5/image 43). Prominently enlarged 2.8 cm right hilar node (series 5/image 56). Enlarged left hilar nodes up to 1.4 cm (series 5/image 57). Lungs/Pleura: No pneumothorax. Small dependent bilateral pleural effusions. Prominent interlobular septal thickening and patchy ground-glass attenuation throughout both lungs. Right infrahilar 3.9 x 2.4 cm mass/adenopathy (series 5/image 71). Numerous poorly marginated subsolid pulmonary nodules throughout both lungs, for example a 1.8 cm inferior right middle lobe nodule (series 4/image 92), a 2.7 cm posterior right lower lobe nodule (series 4/image 70) and a 0.8 cm left upper lobe nodule (series 4/image 36). Musculoskeletal: No aggressive appearing focal osseous lesions. Marked thoracic spondylosis. Review of the MIP images confirms the above findings. CT ABDOMEN  and PELVIS FINDINGS Motion degraded scan. Hepatobiliary: Normal liver size. A few scattered subcentimeter hypodense lesions throughout the liver, too small to characterize. Layering cholelithiasis with no gallbladder wall thickening or pericholecystic fluid. No biliary ductal dilatation. Pancreas: Normal, with no mass or duct dilation. Spleen: Normal size. No mass. Adrenals/Urinary Tract: Normal adrenals.  Nonobstructing left nephrolithiasis measuring 12 mm in the interpolar left kidney and 10 mm in the lower left kidney. No hydronephrosis. Multiple simple bilateral renal cysts, largest 6.7 cm in the upper right kidney and 5.4 cm in the interpolar left kidney. Additional subcentimeter hypodense renal cortical lesions in both kidneys are too small to characterize and require no follow-up. Mild diffuse bladder trabeculation with a few small bladder diverticula, largest 2.2 cm in the anterior right bladder wall. Stomach/Bowel: Normal non-distended stomach. Normal caliber small bowel with no small bowel wall thickening. Appendix not discretely visualized. No pericecal inflammatory changes. Normal large bowel with no diverticulosis, large bowel wall thickening or pericolonic fat stranding. Vascular/Lymphatic: Atherosclerotic abdominal aorta with ectatic 2.6 cm infrarenal abdominal aorta. Patent portal, splenic, hepatic and renal veins. Left para-aortic adenopathy measuring up to 1.1 cm (series 16/image 19). Enlarged 1.2 cm aortocaval node (series 16/image 21). Bilateral common iliac, external iliac and borderline inguinal lymphadenopathy. Representative enlarged 1.8 cm left common iliac node (series 16/image 29). Representative enlarged 2.3 cm left external iliac node (series 16/image 44). Representative borderline enlarged 1.3 cm left inguinal node (series 16/image 51). Mildly enlarged bilateral upper retroperitoneal nodes between the adrenal glands and diaphragmatic cru, largest 1.4 cm on the right (series 5/image 98). Reproductive: Markedly enlarged prostate with nonspecific internal prostatic calcifications. Other: No pneumoperitoneum, ascites or focal fluid collection. Musculoskeletal: No aggressive appearing focal osseous lesions. Marked lumbar spondylosis. Nonspecific subcentimeter sclerotic foci in L1, L3 and L4 vertebral bodies. Review of the MIP images confirms the above findings. IMPRESSION: 1. Motion degraded  scan.  No evidence of pulmonary embolism. 2. Widespread lymphadenopathy involving the bilateral neck, bilateral axilla, bilateral mediastinum, bilateral hilum, retroperitoneum and bilateral pelvis. Poorly marginated pulmonary nodules and interlobular septal thickening throughout both lungs. Lymphoma is favored. Widely metastatic primary bronchogenic carcinoma is on the differential given the asymmetrically bulky right hilar/right infrahilar adenopathy, but is considered less likely. 3. Small dependent bilateral pleural effusions. 4. Dilated main pulmonary artery, suggesting pulmonary arterial hypertension. 5. Markedly enlarged prostate. Diffuse bladder trabeculation and small bladder diverticula compatible with chronic bladder outlet obstruction. Nonobstructing left nephrolithiasis. No hydronephrosis. 6. Aortic Atherosclerosis (ICD10-I70.0). Ectatic infrarenal 2.6 cm abdominal aorta at risk for aneurysm development. Recommend followup by ultrasound in 5 years. This recommendation follows ACR consensus guidelines: White Paper of the ACR Incidental Findings Committee II on Vascular Findings. J Am Coll Radiol 2013; 10:789-794. 7. Cholelithiasis. Electronically Signed   By: Ilona Sorrel M.D.   On: 02/27/2017 17:42    Labs:  CBC: Recent Labs    07/30/16 1423 11/13/16 1530 02/27/17 1514 02/28/17 0423  WBC 6.6 9.1 11.6* 10.0  HGB 14.4 12.2* 12.2* 10.9*  HCT 42.3 34.9* 35.3* 32.1*  PLT 148 218 207 217    COAGS: Recent Labs    02/28/17 0423  INR 1.28    BMP: Recent Labs    07/30/16 1423 11/13/16 1530 02/27/17 1514 02/28/17 0423  NA 140 137 129* 134*  K 3.6 3.5 4.2 4.3  CL 105 103 99* 103  CO2 25 26 19* 21*  GLUCOSE 102* 85 130* 105*  BUN 31* 25 30* 29*  CALCIUM 8.5* 8.4* 9.0 8.6*  CREATININE  1.70* 1.56* 1.72* 1.81*  GFRNONAA 37* 40* 35* 33*  GFRAA 42* 47* 41* 38*    LIVER FUNCTION TESTS: Recent Labs    04/02/16 1218 07/30/16 1423 11/13/16 1530 02/27/17 1514  BILITOT 0.5 0.5  0.3 0.4  AST 23 25 15 29   ALT 31 32 17 25  ALKPHOS 60 57  --  59  PROT 6.8 6.7 6.7 7.3  ALBUMIN 4.1 3.8  --  2.7*    TUMOR MARKERS: No results for input(s): AFPTM, CEA, CA199, CHROMGRNA in the last 8760 hours.  Assessment and Plan: 84 y.o. male with history of chronic kidney disease, rheumatoid arthritis, hypertension, recently treated prostatitis who was admitted to [facility] yesterday with fatigue, cough, chills, fever, weight loss and imaging findings of widespread lymphadenopathy concerning for lymphoma.  Request now received for image guided lymph node biopsy for further evaluation.  Imaging studies have been reviewed by Dr. Pascal Lux and he  feels most accessible/ safest site to biopsy is left inguinal lymph node.Risks and benefits discussed with the patient/daughter including, but not limited to bleeding, infection, damage to adjacent structures or low yield requiring additional tests.  All of the patient's questions were answered, patient is agreeable to proceed. Consent signed and in chart.  Procedure scheduled for today.  Patient's heart rate is elevated today and being addressed by TR[facility].   Thank you for this interesting consult.  I greatly enjoyed meeting KALVYN DESA and look forward to participating in their care.  A copy of this report was sent to the requesting provider on this date.  Electronically Signed: D. Rowe Robert, PA-C 02/28/2017, 10:26 AM   I spent a total of  25 minutes in consultation,   greater than 50% of which was counseling/coordinating care for ultrasound guided left inguinal lymph node biopsy

## 2017-03-01 DIAGNOSIS — R591 Generalized enlarged lymph nodes: Secondary | ICD-10-CM | POA: Diagnosis present

## 2017-03-01 LAB — RESPIRATORY PANEL BY PCR
Adenovirus: NOT DETECTED
BORDETELLA PERTUSSIS-RVPCR: NOT DETECTED
CHLAMYDOPHILA PNEUMONIAE-RVPPCR: NOT DETECTED
CORONAVIRUS 229E-RVPPCR: NOT DETECTED
CORONAVIRUS HKU1-RVPPCR: NOT DETECTED
Coronavirus NL63: NOT DETECTED
Coronavirus OC43: NOT DETECTED
Influenza A: NOT DETECTED
Influenza B: NOT DETECTED
Metapneumovirus: NOT DETECTED
Mycoplasma pneumoniae: NOT DETECTED
Parainfluenza Virus 1: NOT DETECTED
Parainfluenza Virus 2: NOT DETECTED
Parainfluenza Virus 3: NOT DETECTED
Parainfluenza Virus 4: NOT DETECTED
Respiratory Syncytial Virus: NOT DETECTED
Rhinovirus / Enterovirus: NOT DETECTED

## 2017-03-01 LAB — CBC
HCT: 33.7 % — ABNORMAL LOW (ref 39.0–52.0)
Hemoglobin: 11.4 g/dL — ABNORMAL LOW (ref 13.0–17.0)
MCH: 33.4 pg (ref 26.0–34.0)
MCHC: 33.8 g/dL (ref 30.0–36.0)
MCV: 98.8 fL (ref 78.0–100.0)
Platelets: 202 10*3/uL (ref 150–400)
RBC: 3.41 MIL/uL — ABNORMAL LOW (ref 4.22–5.81)
RDW: 16.7 % — ABNORMAL HIGH (ref 11.5–15.5)
WBC: 11.9 10*3/uL — ABNORMAL HIGH (ref 4.0–10.5)

## 2017-03-01 LAB — BASIC METABOLIC PANEL
ANION GAP: 10 (ref 5–15)
BUN: 29 mg/dL — ABNORMAL HIGH (ref 6–20)
CHLORIDE: 106 mmol/L (ref 101–111)
CO2: 19 mmol/L — AB (ref 22–32)
Calcium: 8.1 mg/dL — ABNORMAL LOW (ref 8.9–10.3)
Creatinine, Ser: 1.82 mg/dL — ABNORMAL HIGH (ref 0.61–1.24)
GFR calc Af Amer: 38 mL/min — ABNORMAL LOW (ref >60)
GFR calc non Af Amer: 33 mL/min — ABNORMAL LOW (ref >60)
Glucose, Bld: 107 mg/dL — ABNORMAL HIGH (ref 65–99)
POTASSIUM: 4.2 mmol/L (ref 3.5–5.1)
Sodium: 135 mmol/L (ref 135–145)

## 2017-03-01 LAB — MAGNESIUM: Magnesium: 2.1 mg/dL (ref 1.7–2.4)

## 2017-03-01 LAB — LACTATE DEHYDROGENASE: LDH: 185 U/L (ref 98–192)

## 2017-03-01 LAB — MRSA PCR SCREENING: MRSA by PCR: NEGATIVE

## 2017-03-01 LAB — PROCALCITONIN: Procalcitonin: 1.27 ng/mL

## 2017-03-01 MED ORDER — MIRTAZAPINE 15 MG PO TABS
7.5000 mg | ORAL_TABLET | Freq: Every day | ORAL | Status: DC
  Administered 2017-03-01 – 2017-03-02 (×2): 7.5 mg via ORAL
  Filled 2017-03-01 (×2): qty 1

## 2017-03-01 MED ORDER — DIPHENHYDRAMINE HCL 12.5 MG/5ML PO ELIX
12.5000 mg | ORAL_SOLUTION | Freq: Once | ORAL | Status: AC
  Administered 2017-03-02: 12.5 mg via ORAL
  Filled 2017-03-01: qty 5

## 2017-03-01 MED ORDER — GUAIFENESIN ER 600 MG PO TB12
1200.0000 mg | ORAL_TABLET | Freq: Two times a day (BID) | ORAL | Status: DC | PRN
  Administered 2017-03-01 – 2017-03-02 (×4): 1200 mg via ORAL
  Filled 2017-03-01 (×4): qty 2

## 2017-03-01 MED ORDER — ENSURE ENLIVE PO LIQD
237.0000 mL | Freq: Two times a day (BID) | ORAL | Status: DC
  Filled 2017-03-01 (×2): qty 237

## 2017-03-01 MED ORDER — ACETAMINOPHEN 325 MG PO TABS
650.0000 mg | ORAL_TABLET | Freq: Four times a day (QID) | ORAL | Status: DC | PRN
  Administered 2017-03-01 – 2017-03-11 (×17): 650 mg via ORAL
  Filled 2017-03-01 (×21): qty 2

## 2017-03-01 MED ORDER — ADULT MULTIVITAMIN W/MINERALS CH
1.0000 | ORAL_TABLET | Freq: Every day | ORAL | Status: DC
  Administered 2017-03-01 – 2017-03-13 (×9): 1 via ORAL
  Filled 2017-03-01 (×13): qty 1

## 2017-03-01 MED ORDER — ENSURE ENLIVE PO LIQD
237.0000 mL | ORAL | Status: DC
  Administered 2017-03-03: 237 mL via ORAL

## 2017-03-01 MED ORDER — LACTATED RINGERS IV SOLN
INTRAVENOUS | Status: DC
  Administered 2017-03-01: 11:00:00 via INTRAVENOUS

## 2017-03-01 MED ORDER — IPRATROPIUM-ALBUTEROL 0.5-2.5 (3) MG/3ML IN SOLN: 3.0000 mL | Freq: Four times a day (QID) | RESPIRATORY_TRACT | Status: DC

## 2017-03-01 MED ORDER — BOOST / RESOURCE BREEZE PO LIQD CUSTOM
1.0000 | Freq: Two times a day (BID) | ORAL | Status: DC
  Administered 2017-03-01 – 2017-03-05 (×5): 1 via ORAL
  Filled 2017-03-01 (×2): qty 1

## 2017-03-01 MED ORDER — IPRATROPIUM-ALBUTEROL 0.5-2.5 (3) MG/3ML IN SOLN
3.0000 mL | Freq: Four times a day (QID) | RESPIRATORY_TRACT | Status: DC | PRN
  Administered 2017-03-02 (×3): 3 mL via RESPIRATORY_TRACT
  Filled 2017-03-01 (×3): qty 3

## 2017-03-01 MED ORDER — METOPROLOL TARTRATE 50 MG PO TABS
50.0000 mg | ORAL_TABLET | Freq: Two times a day (BID) | ORAL | Status: DC
  Administered 2017-03-02: 50 mg via ORAL
  Filled 2017-03-01: qty 1

## 2017-03-01 MED ORDER — PREDNISONE 20 MG PO TABS: 40.0000 mg | ORAL_TABLET | Freq: Every day | ORAL | Status: DC

## 2017-03-01 MED ORDER — IBUPROFEN 200 MG PO TABS
400.0000 mg | ORAL_TABLET | Freq: Four times a day (QID) | ORAL | Status: DC | PRN
  Administered 2017-03-03 – 2017-03-13 (×8): 400 mg via ORAL
  Filled 2017-03-01 (×10): qty 2

## 2017-03-01 MED ORDER — SODIUM CHLORIDE 0.9 % IV SOLN
1.0000 g | Freq: Two times a day (BID) | INTRAVENOUS | Status: DC
  Administered 2017-03-01 – 2017-03-05 (×9): 1 g via INTRAVENOUS
  Filled 2017-03-01 (×10): qty 1

## 2017-03-01 NOTE — Consult Note (Addendum)
[facility] CONSULT NOTE  Patient Care Team: Lajean Manes, MD as PCP - General (Internal Medicine) Bo Merino, MD as Consulting Physician (Rheumatology) Rana Snare, MD as Consulting Physician (Urology) Kristeen Miss, MD as Consulting Physician (Neurosurgery) Ronald Lobo, MD as Consulting Physician (Gastroenterology) Sharyne Peach, MD as Consulting Physician (Ophthalmology) Garald Balding, MD (Orthopedic Surgery)  CHIEF COMPLAINTS/PURPOSE OF CONSULTATION:  Generalized lymphadenopathy highly suspicious for lymphoma  HISTORY OF PRESENTING ILLNESS:  Jose Meza 84 y.o. male is admitted to the hospital with multiple symptoms including 3-3-monthhistory of weight loss, low-grade fevers, sweats, shortness of breath.  He has a history of rheumatoid arthritis on Xeljanz.  Because of cough fevers and chills he had a CT scan of chest abdomen pelvis.  The scans revealed extensive hilar mediastinal lymphadenopathy along with bilateral lung infiltrates.  She also had lymphadenopathy in the cervical axillary and inguinal and retroperitoneal areas.  In addition there were multiple cysts in the kidney.  He underwent a fine-needle aspiration of the right inguinal lymph node which unfortunately does not seem to have enough material to run flow cytometry.  The initial H&E evaluation did not reveal any lymphoma.  Additional stains have been ordered but they will not be available till Monday. Meanwhile patient complains of shortness of breath.  He also describes a weird taste in the mouth making it difficult to eat.  He has lost 20-25 pounds.  I reviewed her records extensively and collaborated the history with the patient.  MEDICAL HISTORY:  Past Medical History:  Diagnosis Date  . Allergic rhinitis, seasonal   . Arthritis    neg rheum eval  . BPH (benign prostatic hypertrophy)    recurrent prostatitis, hx BOO  . Cauda equina, syndrome   . Chronic kidney disease    stage 2-3  . DJD (degenerative joint disease) of knee    right knee, s/p knee replacement 12/06  . GERD (gastroesophageal reflux disease)   . Hyperlipidemia   . Hypertension   . IBS (irritable bowel syndrome)    constipation prone  . Incomplete RBBB    ECG 05/08/13  . Polyp of colon, adenomatous 07/2009   buccini  . [place]     SURGICAL HISTORY: Past Surgical History:  Procedure Laterality Date  . JOINT REPLACEMENT  12/2004   R TKR  . KNEE ARTHROPLASTY    . Right knee replacement  2007  . STrent . Ulner Tranpos  2009    SOCIAL HISTORY: Social History   Socioeconomic History  . Marital status: Widowed    Spouse name: Not on file  . Number of children: Not on file  . Years of education: Not on file  . Highest education level: Not on file  Social Needs  . Financial resource strain: Not on file  . Food insecurity - worry: Not on file  . Food insecurity - inability: Not on file  . Transportation needs - medical: Not on file  . Transportation needs - non-medical: Not on file  Occupational History  . Not on file  Tobacco Use  . Smoking status: Former Smoker    Packs/day: 2.00    Years: 7.00    Pack years: 14.00    Types: Cigarettes    Last attempt to quit: 01/16/1956    Years since quitting: 61.1  . Smokeless tobacco: Never Used  Substance and Sexual Activity  . Alcohol use: No    Frequency: Never  . Drug use: No  .  Sexual activity: Not on file  Other Topics Concern  . 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 Colon cancer Mother     ALLERGIES:  is allergic to sulfa antibiotics.  MEDICATIONS:  Current Facility-Administered Medications  Medication Dose Route Frequency Provider Last Rate Last Dose  . 0.9 %  sodium chloride infusion  250 mL Intravenous PRN Phillips Grout, MD      . acetaminophen (TYLENOL) tablet 650 mg  650 mg Oral Q6H PRN Elodia Florence., MD      . alum & mag hydroxide-simeth (MAALOX/MYLANTA) 200-200-20 MG/5ML suspension 30 mL  30 mL Oral Q6H PRN Elodia Florence., MD   30 mL at 03/01/17 0004  . azithromycin (ZITHROMAX) 500 mg in sodium chloride 0.9 % 250 mL IVPB  500 mg Intravenous Q24H Phillips Grout, MD   Stopped at 02/28/17 2013  . ceFEPIme (MAXIPIME) 1 g in sodium chloride 0.9 % 100 mL IVPB  1 g Intravenous BID Leodis Sias T, RPH 200 mL/hr at 03/01/17 1243 1 g at 03/01/17 1243  . feeding supplement (BOOST / RESOURCE BREEZE) liquid 1 Container  1 Container Oral BID BM Elodia Florence., MD   1 Container at 03/01/17 1144  . [START ON 03/02/2017] feeding supplement (ENSURE ENLIVE) (ENSURE ENLIVE) liquid 237 mL  237 mL Oral Q24H Elodia Florence., MD      . finasteride (PROSCAR) tablet 5 mg  5 mg Oral Daily Elodia Florence., MD   5 mg at 03/01/17 0914  . fluticasone (FLONASE) 50 MCG/ACT nasal spray 1 spray  1 spray Each Nare Daily Elodia Florence., MD   1 spray at 03/01/17 [phone]  . guaiFENesin (MUCINEX) 12 hr tablet 1,200 mg  1,200 mg Oral BID PRN Elodia Florence., MD      . ibuprofen (ADVIL,MOTRIN) tablet 400 mg  400 mg Oral Q6H PRN Elodia Florence., MD      . lactated ringers infusion   Intravenous Continuous Elodia Florence., MD 125 mL/hr at 03/01/17 1042    . LORazepam (ATIVAN) tablet 0.5 mg  0.5 mg Oral BID PRN Phillips Grout, MD   0.5 mg at 03/01/17 0420  . metoprolol succinate (TOPROL-XL) 24 hr tablet 100 mg  100 mg Oral Daily Derrill Kay A, MD   100 mg at 03/01/17 0914  . multivitamin with minerals tablet 1 tablet  1 tablet Oral Daily Elodia Florence., MD   1 tablet at 03/01/17 1018  . oxymetazoline (AFRIN) 0.05 % nasal spray 1 spray  1 spray Each Nare BID PRN Elodia Florence., MD   1 spray at 03/01/17 0004  . pantoprazole (PROTONIX) EC tablet 40 mg  40 mg Oral  Daily Elodia Florence., MD   40 mg at 03/01/17 0914  . sodium chloride flush (NS) 0.9 % injection 3 mL  3 mL Intravenous Q12H Derrill Kay A, MD   3 mL at 02/28/17 1038  . sodium chloride flush (NS) 0.9 % injection 3 mL  3 mL Intravenous PRN Derrill Kay A, MD      . tamsulosin (FLOMAX) capsule 0.4 mg  0.4 mg Oral Daily Elodia Florence., MD   0.4 mg at  03/01/17 0914  . zolpidem (AMBIEN) tablet 5 mg  5 mg Oral QHS PRN Phillips Grout, MD   5 mg at 02/28/17 2119    REVIEW OF SYSTEMS:   Constitutional: Denies fevers, chills or abnormal night sweats Eyes: Denies blurriness of vision, double vision or watery eyes Ears, nose, mouth, throat, and face: Denies mucositis or sore throat Respiratory: Shortness of breath at rest Cardiovascular: Denies palpitation, chest discomfort or lower extremity swelling Gastrointestinal: Bad taste in the mouth making it difficult to eat Skin: Denies abnormal skin rashes Lymphatics: Cervical and axillary lymphadenopathy palpable Neurological:Denies numbness, tingling or new weaknesses Behavioral/Psych: Mood is stable, no new changes   All other systems were reviewed with the patient and are negative.  PHYSICAL EXAMINATION: ECOG PERFORMANCE STATUS: 3 - Symptomatic, >50% confined to bed  Vitals:   03/01/17 0735 03/01/17 1013  BP: (!) 104/43   Pulse: (!) 124 (!) 110  Resp: 20   Temp: 99.7 F (37.6 C)   SpO2: 96% (!) 71%   Filed Weights   02/27/17 1503 02/27/17 2210  Weight: 194 lb (88 kg) 200 lb 2.8 oz (90.8 kg)    GENERAL:alert, no distress and comfortable SKIN: skin color, texture, turgor are normal, no rashes or significant lesions EYES: normal, conjunctiva are pink and non-injected, sclera clear OROPHARYNX:no exudate, no erythema and lips, buccal mucosa, and tongue normal  NECK: supple, thyroid normal size, non-tender, without nodularity LYMPH: Palpable lymphadenopathy in the cervical axillary and inguinal areas LUNGS: clear to  auscultation and percussion with normal breathing effort HEART: regular rate & rhythm and no murmurs and no lower extremity edema ABDOMEN:abdomen soft, non-tender and normal bowel sounds Musculoskeletal:no cyanosis of digits and no clubbing  PSYCH: alert & oriented x 3 with fluent speech NEURO: no focal motor/sensory deficits   LABORATORY DATA:  I have reviewed the data as listed Lab Results  Component Value Date   WBC 11.9 (H) 03/01/2017   HGB 11.4 (L) 03/01/2017   HCT 33.7 (L) 03/01/2017   MCV 98.8 03/01/2017   PLT 202 03/01/2017   Lab Results  Component Value Date   NA 135 03/01/2017   K 4.2 03/01/2017   CL 106 03/01/2017   CO2 19 (L) 03/01/2017    RADIOGRAPHIC STUDIES: I have personally reviewed the radiological reports and agreed with the findings in the report.  ASSESSMENT AND PLAN:  1.  Generalized lymphadenopathy with fevers chills night sweats and weight loss: Classic symptoms of lymphoma.  Since the fine-needle aspiration did not have enough material to run flow cytometry and other tests, we do not have a definitive diagnosis.  We will request surgery to obtain as a excisional biopsy of 1 of the lymph nodes.  Patient has lymph nodes in his axilla as well as cervical areas that could be biopsied.  Lengthy discussion with the patient and his daughter and son.  He is a full code and wants as aggressive treatment as possible.  We discussed different treatment options if it is a lymphoma.  I will obtain hepatitis B and C testing as well as serum protein electrophoresis.  If he does get diagnosed with lymphoma then we may initiate his first cycle of chemotherapy as an inpatient.  He will also need a bone marrow biopsy if it is lymphoma.  I would also like to obtain LDH. 2. patient's family discussed about second opinion.  I encouraged him to get to the diagnostic workup and then decide if they want to go somewhere else  at that time. 3.  Renal failure: Acute on chronic 4.   Full code I spent an hour discussing different options and treatments for lymphoma.  All questions were answered. The patient knows to call the clinic with any problems, questions or concerns.    Harriette Ohara, MD @T @  Addendum: I also discussed with him that if he were to develop profound respiratory compromise, radiation oncology may need to be consulted to do palliative radiation therapy.

## 2017-03-01 NOTE — Progress Notes (Signed)
Initial Nutrition Assessment  DOCUMENTATION CODES:   Not applicable  INTERVENTION:  - Will order Ensure Enlive once/day, this supplement provides 350 kcal and 20 grams of protein. - Will order Boost Breeze BID, each supplement provides 250 kcal and 9 grams of protein. - Will order daily multivitamin with minerals.  - Continue to encourage PO intakes.   NUTRITION DIAGNOSIS:   Inadequate oral intake related to acute illness, decreased appetite as evidenced by per patient/family report.  GOAL:   Patient will meet greater than or equal to 90% of their needs  MONITOR:   PO intake, Supplement acceptance, Weight trends, Labs  REASON FOR ASSESSMENT:   Malnutrition Screening Tool, Consult Assessment of nutrition requirement/status  ASSESSMENT:   84 y.o. male with medical history significant for CKD, rheumatoid arthritis chronically on steroids, HTN. He transferred from [facility] after findings concerning for lymphoma.  Patient arrived there with complaints of coughing for over a week with chills and low-grade fevers at home. He reports losing over 20 pounds in the last 4-6 weeks. He was recently treated (a month ago) for prostatitis with an unknown antibiotic.  BMI indicates normal weight. Diet advanced from NPO to Regular yesterday at 1:00 PM. Pt was able to consume 100% of lunch which was chicken tenders and a few side items (875 kcal, 31 grams of protein) without issue. He reports congestion and some throat tightness PTA but tightness has not been an issue since admission. He denies any chewing difficulties or overt swallowing difficulties at this time.  Pt reports that over the past few months he has been feeling unwell and has had a decrease in appetite/desire to eat due to this. Congestion has sometimes limited intakes as well. As outlined above from H&P, pt feels that he has been losing weight over the past 1-2 months. He had reported a 20 lb weight loss in the past 4-6  weeks. This could not be confirmed by weights available in the chart which indicates that most recently pt weighed 204 lbs on 04/27/16 and 201 lbs on 11/13/16. Current weight recorded as 200 lbs (which was a scaled weight) although on admission pt's self-reported weight was 194 lbs. Noted need for high-rate IVF. Will continue to monitor weight trends closely.   Pt had ultrasound-guided L inguinal lymph node biopsy yesterday. Results pending.   Medications reviewed. Labs reviewed; BUN: 29 mg/dL, creatinine: 1.82 mg/dL, Ca: 8.1 mg/dL, GFR: 33 mL/min.  IVF: LR @ 125 mL/hr.      NUTRITION - FOCUSED PHYSICAL EXAM:    Most Recent Value  Orbital Region  No depletion  Upper Arm Region  Mild depletion  Thoracic and Lumbar Region  No depletion  Buccal Region  Mild depletion  Temple Region  No depletion  Clavicle Bone Region  Mild depletion  Clavicle and Acromion Bone Region  Mild depletion  Scapular Bone Region  No depletion  Dorsal Hand  No depletion  Patellar Region  No depletion  Anterior Thigh Region  No depletion  Posterior Calf Region  No depletion  Edema (RD Assessment)  None  Hair  Reviewed  Eyes  Reviewed  Mouth  Reviewed  Skin  Reviewed  Nails  Reviewed       Diet Order:  Diet regular Room service appropriate? Yes; Fluid consistency: Thin  EDUCATION NEEDS:   No education needs have been identified at this time  Skin:  Skin Assessment: Reviewed RN Assessment  Last BM:  2/15  Height:   Ht Readings  from Last 1 Encounters:  02/27/17 6\' 5"  (1.956 m)    Weight:   Wt Readings from Last 1 Encounters:  02/27/17 200 lb 2.8 oz (90.8 kg)    Ideal Body Weight:  94.54 kg  BMI:  Body mass index is 23.74 kg/m.  Estimated Nutritional Needs:   Kcal:  5784-6962 (24-26 kcal/kg)  Protein:  110-127 grams (1.2-1.4 grams/kg)  Fluid:  >/= 2.2 L/day     Jarome Matin, MS, RD, LDN, [facility] Inpatient Clinical Dietitian Pager # [phone] After hours/weekend pager #  [phone]

## 2017-03-01 NOTE — Consult Note (Signed)
[facility] Surgery Consult Note  COHL BEHRENS 06-29-1931  [id number].    Requesting MD: Fayrene Helper Chief Complaint/Reason for Consult: lymph node biopsy  HPI:  Jose Meza is an 84yo male admitted to [facility] 2/13 with pneumonia and hypoxia. He was started on IV rocephin and azithromycin; this was broadened to cefepime on 2/15.  Patient was also found to have diffuse lymphadenopathy on CTA of the chest concerning for lymphoma. The scans revealed extensive hilar mediastinal lymphadenopathy along with bilateral lung infiltrates.  He also had lymphadenopathy in the cervical axillary and inguinal and retroperitoneal areas.  He reported unintentional weight loss of 25lb over the last couple of months as well as intermittent fevers, chills, and rigors. Denies night sweats or noticing any palpable lymph nodes. He underwent core needle biopsy of left inguinal lymphadenopathy by interventional radiology 2/14 but unfortunately there were insufficient cells for analysis. General surgery asked to consult for excisional lymph node biopsy.  PMH significant for CKD, RA chronically on steroids, HTN, HLD  ROS: Review of Systems  Constitutional: Positive for chills, fever, malaise/fatigue and weight loss.  HENT: Negative.   Eyes: Negative.   Respiratory: Positive for cough and shortness of breath.   Cardiovascular: Negative.   Gastrointestinal: Negative.   Genitourinary: Negative.   Musculoskeletal: Negative.   Skin: Negative.   Neurological: Positive for weakness.   All systems reviewed and otherwise negative except for as above  Family History  Problem Relation Age of Onset  . Colon cancer Mother     Past Medical History:  Diagnosis Date  . Allergic rhinitis, seasonal   . Arthritis    neg rheum eval  . BPH (benign prostatic hypertrophy)    recurrent prostatitis, hx BOO  . Cauda equina, syndrome   . Chronic kidney disease    stage 2-3  . DJD (degenerative  joint disease) of knee    right knee, s/p knee replacement 12/06  . GERD (gastroesophageal reflux disease)   . Hyperlipidemia   . Hypertension   . IBS (irritable bowel syndrome)    constipation prone  . Incomplete RBBB    ECG 05/08/13  . Polyp of colon, adenomatous 07/2009   buccini  . Rheumatoid arthritis [facility])     Past Surgical History:  Procedure Laterality Date  . JOINT REPLACEMENT  12/2004   R TKR  . KNEE ARTHROPLASTY    . Right knee replacement  2007  . Cole  . Ulner Tranpos  2009    Social History:  reports that he quit smoking about 61 years ago. His smoking use included cigarettes. He has a 14.00 pack-year smoking history. he has never used smokeless tobacco. He reports that he does not drink alcohol or use drugs.  Allergies:  Allergies  Allergen Reactions  . Sulfa Antibiotics Other (See Comments)    Paradoxical effect. Patient states Sulfa does not work for him.    Medications Prior to Admission  Medication Sig Dispense Refill  . atorvastatin (LIPITOR) 10 MG tablet Take 1 tablet (10 mg total) by mouth daily. 90 tablet 4  . b complex vitamins tablet Take 1 tablet by mouth daily.    . carisoprodol (SOMA) 350 MG tablet Take 350 mg by mouth daily as needed for muscle spasms.    . cholecalciferol (VITAMIN D) 1000 UNITS tablet Take 1,000 Units by mouth daily.    Marland Kitchen esomeprazole (NEXIUM) 40 MG capsule Take 1 capsule (40 mg total) by mouth at bedtime. (Patient taking  differently: Take 40 mg by mouth as needed. ) 90 capsule 0  . famotidine (PEPCID AC) 10 MG chewable tablet Chew 10 mg by mouth as needed.     . finasteride (PROSCAR) 5 MG tablet Take 5 mg by mouth daily.      . fluticasone (FLONASE) 50 MCG/ACT nasal spray Place into both nostrils daily.    Marland Kitchen glucosamine-chondroitin 500-400 MG tablet Take 1 tablet by mouth 3 (three) times daily.    Marland Kitchen LORazepam (ATIVAN) 0.5 MG tablet as needed.    . Lutein 20 MG CAPS Take 20 mg by mouth daily.    . metoprolol  succinate (TOPROL-XL) 100 MG 24 hr tablet Take 1 tablet (100 mg total) by mouth daily. 90 tablet 3  . montelukast (SINGULAIR) 10 MG tablet     . tamsulosin (FLOMAX) 0.4 MG CAPS capsule Take 1 capsule (0.4 mg total) by mouth daily. 15 capsule 0  . Tofacitinib Citrate (XELJANZ) 5 MG TABS Take 1 tablet by mouth daily. 60 tablet 0  . vitamin C (ASCORBIC ACID) 500 MG tablet Take 4,000 mg by mouth daily.    Marland Kitchen zolpidem (AMBIEN CR) 12.5 MG CR tablet Take 12.5 mg by mouth at bedtime as needed for sleep.    Marland Kitchen amitriptyline (ELAVIL) 10 MG tablet Take 1 tablet (10 mg total) by mouth at bedtime. 90 tablet 1  . meprobamate (EQUANIL) 200 MG tablet Take 2 tablets (400 mg total) by mouth at bedtime as needed (pain.). (Patient not taking: Reported on 04/27/2016) 180 tablet 1  . zolpidem (AMBIEN) 5 MG tablet Take 1 tablet (5 mg total) by mouth at bedtime as needed for sleep. (Patient not taking: Reported on 02/27/2017) 90 tablet 1    Prior to Admission medications   Medication Sig Start Date End Date Taking? Authorizing Provider  atorvastatin (LIPITOR) 10 MG tablet Take 1 tablet (10 mg total) by mouth daily. 06/21/14  Yes Rowe Clack, MD  b complex vitamins tablet Take 1 tablet by mouth daily.   Yes [provider]  carisoprodol (SOMA) 350 MG tablet Take 350 mg by mouth daily as needed for muscle spasms.   Yes [provider]  cholecalciferol (VITAMIN D) 1000 UNITS tablet Take 1,000 Units by mouth daily.   Yes [provider]  esomeprazole (NEXIUM) 40 MG capsule Take 1 capsule (40 mg total) by mouth at bedtime. Patient taking differently: Take 40 mg by mouth as needed.  03/22/14  Yes Rowe Clack, MD  famotidine (PEPCID AC) 10 MG chewable tablet Chew 10 mg by mouth as needed.    Yes [provider]  finasteride (PROSCAR) 5 MG tablet Take 5 mg by mouth daily.     Yes [provider]  fluticasone (FLONASE) 50 MCG/ACT nasal spray Place into both nostrils daily.    Yes [provider]  glucosamine-chondroitin 500-400 MG tablet Take 1 tablet by mouth 3 (three) times daily.   Yes [provider]  LORazepam (ATIVAN) 0.5 MG tablet as needed. 11/09/16  Yes [provider]  Lutein 20 MG CAPS Take 20 mg by mouth daily.   Yes [provider]  metoprolol succinate (TOPROL-XL) 100 MG 24 hr tablet Take 1 tablet (100 mg total) by mouth daily. 06/04/14  Yes Rowe Clack, MD  montelukast (SINGULAIR) 10 MG tablet  11/12/16  Yes [provider]  tamsulosin (FLOMAX) 0.4 MG CAPS capsule Take 1 capsule (0.4 mg total) by mouth daily. 04/09/13  Yes Lawyer, Harrell Gave, PA-C  Tofacitinib  Citrate (XELJANZ) 5 MG TABS Take 1 tablet by mouth daily. 02/13/17  Yes Deveshwar, Abel Presto, MD  vitamin C (ASCORBIC ACID) 500 MG tablet Take 4,000 mg by mouth daily.   Yes [provider]  zolpidem (AMBIEN CR) 12.5 MG CR tablet Take 12.5 mg by mouth at bedtime as needed for sleep.   Yes [provider]  amitriptyline (ELAVIL) 10 MG tablet Take 1 tablet (10 mg total) by mouth at bedtime. 05/20/13 10/27/15  Rowe Clack, MD  meprobamate (EQUANIL) 200 MG tablet Take 2 tablets (400 mg total) by mouth at bedtime as needed (pain.). Patient not taking: Reported on 04/27/2016 06/02/14   Rowe Clack, MD  zolpidem (AMBIEN) 5 MG tablet Take 1 tablet (5 mg total) by mouth at bedtime as needed for sleep. Patient not taking: Reported on 02/27/2017 07/08/14   Rowe Clack, MD    Blood pressure (!) 104/43, pulse (!) 110, temperature 99.7 F (37.6 C), temperature source Oral, resp. rate 20, height 6' 5"  (1.956 m), weight 90.8 kg (200 lb 2.8 oz), SpO2 (!) 71 %. Physical Exam: General: pleasant, WD/WN white male who is laying in bed in NAD HEENT: head is normocephalic, atraumatic.  Sclera are noninjected.  Pupils equal and round.  Ears and nose without any masses or lesions.  Mouth is pink and moist. Dentition fair Heart: regular,  rate, and rhythm.  No obvious murmurs, gallops, or rubs noted.  Palpable pedal pulses bilaterally Lungs: diminished breath sounds bilateral bases, no wheezes or rhonchi, mild crackles bilaterally.  Respiratory effort nonlabored on  Abd: soft, mild distension, nontender, +BS, no masses, hernias, or organomegaly.  MS: all 4 extremities are symmetrical with no cyanosis, clubbing, or edema. No palpable inguinal lymph nodes. Small right axillary lymph node palpable Skin: warm and dry with no masses, lesions, or rashes Psych: A&Ox3 with an appropriate affect. Neuro: cranial nerves grossly intact, extremity CSM intact bilaterally, normal speech  Results for orders placed or performed during the hospital encounter of 02/27/17 (from the past 48 hour(s))  CBC with Differential/Platelet     Status: Abnormal   Collection Time: 02/27/17  3:14 PM  Result Value Ref Range   WBC 11.6 (H) 4.0 - 10.5 K/uL   RBC 3.65 (L) 4.22 - 5.81 MIL/uL   Hemoglobin 12.2 (L) 13.0 - 17.0 g/dL   HCT 35.3 (L) 39.0 - 52.0 %   MCV 96.7 78.0 - 100.0 fL   MCH 33.4 26.0 - 34.0 pg   MCHC 34.6 30.0 - 36.0 g/dL   RDW 16.1 (H) 11.5 - 15.5 %   Platelets 207 150 - 400 K/uL   Neutrophils Relative % 77 %   Lymphocytes Relative 7 %   Monocytes Relative 13 %   Eosinophils Relative 2 %   Basophils Relative 1 %   Neutro Abs 9.0 (H) 1.7 - 7.7 K/uL   Lymphs Abs 0.8 0.7 - 4.0 K/uL   Monocytes Absolute 1.5 (H) 0.1 - 1.0 K/uL   Eosinophils Absolute 0.2 0.0 - 0.7 K/uL   Basophils Absolute 0.1 0.0 - 0.1 K/uL   WBC Morphology ATYPICAL LYMPHOCYTES     Comment: TOXIC GRANULATION Performed at [facility], [address] [place], [place] [address]   Comprehensive metabolic panel     Status: Abnormal   Collection Time: 02/27/17  3:14 PM  Result Value Ref Range   Sodium 129 (L) 135 - 145 mmol/L   Potassium 4.2 3.5 - 5.1 mmol/L   Chloride 99 (  L) 101 - 111 mmol/L   CO2 19 (L) 22 - 32 mmol/L   Glucose, Bld 130 (H) 65 - 99 mg/dL    BUN 30 (H) 6 - 20 mg/dL   Creatinine, Ser 1.72 (H) 0.61 - 1.24 mg/dL   Calcium 9.0 8.9 - 10.3 mg/dL   Total Protein 7.3 6.5 - 8.1 g/dL   Albumin 2.7 (L) 3.5 - 5.0 g/dL   AST 29 15 - 41 U/L   ALT 25 17 - 63 U/L   Alkaline Phosphatase 59 38 - 126 U/L   Total Bilirubin 0.4 0.3 - 1.2 mg/dL   GFR calc non Af Amer 35 (L) >60 mL/min   GFR calc Af Amer 41 (L) >60 mL/min    Comment: (NOTE) The eGFR has been calculated using the CKD EPI equation. This calculation has not been validated in all clinical situations. eGFR's persistently <60 mL/min signify possible Chronic Kidney Disease.    Anion gap 11 5 - 15    Comment: Performed at [facility], [address] [place], [place] [address]  Lipase, blood     Status: None   Collection Time: 02/27/17  3:14 PM  Result Value Ref Range   Lipase 46 11 - 51 U/L    Comment: Performed at [facility], [address] [place], [place] [address]  Troponin I     Status: None   Collection Time: 02/27/17  3:14 PM  Result Value Ref Range   Troponin I <0.03 <0.03 ng/mL    Comment: Performed at [facility], [address] [place], [place] [address]  Influenza panel by PCR (type A & B)     Status: None   Collection Time: 02/27/17  3:58 PM  Result Value Ref Range   Influenza A By PCR NEGATIVE NEGATIVE   Influenza B By PCR NEGATIVE NEGATIVE    Comment: (NOTE) The Xpert Xpress Flu assay is intended as an aid in the diagnosis of  influenza and should not be used as a sole basis for treatment.  This  assay is FDA approved for nasopharyngeal swab specimens only. Nasal  washings and aspirates are unacceptable for Xpert Xpress Flu testing. Performed at [facility], [place] [address] [place], [place] [address]   Blood culture (routine x 2)     Status: None (Preliminary result)   Collection Time: 02/27/17  6:00 PM  Result Value Ref Range   Specimen Description      BLOOD BLOOD RIGHT HAND Performed at [facility], [address] [place], [place] [address]    Special Requests      BOTTLES DRAWN AEROBIC AND ANAEROBIC Blood Culture adequate volume Performed at [facility], [address] [place], [place] [address]    Culture      NO GROWTH < 24 HOURS Performed at [facility], [place] [address] [place], [place] [address]    Report Status PENDING   I-Stat CG4 Lactic Acid, ED     Status: None   Collection Time: 02/27/17  6:14 PM  Result Value Ref Range   Lactic Acid, Venous 1.29 0.5 - 1.9 mmol/L  Blood culture (routine x 2)     Status: None (Preliminary result)   Collection Time: 02/27/17  6:15 PM  Result Value Ref Range   Specimen Description      BLOOD LEFT ANTECUBITAL Performed at [facility], [address] [place], [place] [address]    Special Requests      BOTTLES DRAWN AEROBIC AND ANAEROBIC Blood Culture adequate volume Performed at [facility], [address] [place], [place] [address]    Culture      NO GROWTH < 24 HOURS Performed at [facility], [place] [address] [place], [place] [address]    Report Status PENDING   Urinalysis, Routine w reflex microscopic     Status: Abnormal   Collection Time: 02/27/17  7:40 PM  Result Value Ref Range   Color, Urine YELLOW YELLOW   APPearance CLOUDY (A) CLEAR   Specific Gravity, Urine <1.005 (L) 1.005 - 1.030   pH 6.0 5.0 - 8.0   Glucose, UA NEGATIVE NEGATIVE mg/dL   Hgb urine dipstick TRACE (A) NEGATIVE   Bilirubin Urine NEGATIVE NEGATIVE   Ketones, ur NEGATIVE NEGATIVE mg/dL   Protein, ur NEGATIVE NEGATIVE mg/dL   Nitrite POSITIVE (A) NEGATIVE   Leukocytes, UA MODERATE (A) NEGATIVE    Comment: Performed at [facility], [address] [place], [place] [address]  Urine culture     Status: Abnormal (Preliminary result)   Collection Time: 02/27/17  7:40 PM  Result Value Ref Range   Specimen Description      URINE, RANDOM Performed at [facility], [address] [place], [place] [address]    Special Requests      NONE Performed at [facility], [address] [place], [place] [address]    Culture >=100,000 COLONIES/mL KLEBSIELLA PNEUMONIAE (A)    Report Status PENDING   TSH     Status: Abnormal   Collection Time: 02/27/17  7:40 PM  Result Value Ref Range   TSH 6.452 (H) 0.350 - 4.500 uIU/mL    Comment: Performed by a 3rd Generation assay with a functional sensitivity of <=0.01 uIU/mL. Performed at [facility], [place] [address], [place], [place] [address]   Urinalysis, Microscopic (reflex)     Status: Abnormal   Collection Time: 02/27/17  7:40 PM  Result Value Ref Range   RBC / HPF 0-5 0 - 5 RBC/hpf   WBC, UA 6-30 0 - 5 WBC/hpf   Bacteria, UA MANY (A) NONE SEEN   Squamous Epithelial / LPF 6-30 (A) NONE SEEN    Comment: Performed at [facility], [address] [place], [place] [address]  Strep pneumoniae urinary antigen     Status: None   Collection Time: 02/28/17  3:04 AM  Result Value Ref Range   Strep Pneumo Urinary Antigen NEGATIVE NEGATIVE    Comment:        Infection due to S. pneumoniae cannot be absolutely ruled out since the antigen present may be below the detection limit of the test.   Basic metabolic panel     Status: Abnormal   Collection Time: 02/28/17  4:23 AM  Result Value Ref Range   Sodium 134 (L) 135 - 145 mmol/L   Potassium 4.3 3.5 - 5.1 mmol/L   Chloride 103 101 - 111 mmol/L   CO2 21 (L) 22 - 32 mmol/L   Glucose, Bld 105 (H) 65 - 99 mg/dL   BUN 29 (H) 6 - 20 mg/dL   Creatinine, Ser 1.81 (H) 0.61 - 1.24 mg/dL   Calcium 8.6 (L) 8.9 - 10.3 mg/dL   GFR calc non Af Amer 33 (L) >60 mL/min   GFR calc Af  Amer 38 (L) >60 mL/min    Comment: (NOTE) The eGFR has been calculated using the CKD EPI equation. This calculation has not been validated in all clinical situations. eGFR's persistently <60 mL/min signify possible Chronic Kidney Disease.    Anion gap 10 5 - 15     Comment: Performed at [facility], [place] [address], [place], [place] [address]  CBC WITH DIFFERENTIAL     Status: Abnormal   Collection Time: 02/28/17  4:23 AM  Result Value Ref Range   WBC 10.0 4.0 - 10.5 K/uL   RBC 3.27 (L) 4.22 - 5.81 MIL/uL   Hemoglobin 10.9 (L) 13.0 - 17.0 g/dL   HCT 32.1 (L) 39.0 - 52.0 %   MCV 98.2 78.0 - 100.0 fL   MCH 33.3 26.0 - 34.0 pg   MCHC 34.0 30.0 - 36.0 g/dL   RDW 16.4 (H) 11.5 - 15.5 %   Platelets 217 150 - 400 K/uL   Neutrophils Relative % 78 %   Lymphocytes Relative 14 %   Monocytes Relative 4 %   Eosinophils Relative 3 %   Basophils Relative 1 %   Neutro Abs 7.8 (H) 1.7 - 7.7 K/uL   Lymphs Abs 1.4 0.7 - 4.0 K/uL   Monocytes Absolute 0.4 0.1 - 1.0 K/uL   Eosinophils Absolute 0.3 0.0 - 0.7 K/uL   Basophils Absolute 0.1 0.0 - 0.1 K/uL   WBC Morphology MILD LEFT SHIFT (1-5% METAS, OCC MYELO, OCC BANDS)     Comment: Performed at [facility], [place] [address] [place], [place] [address]  Protime-INR     Status: Abnormal   Collection Time: 02/28/17  4:23 AM  Result Value Ref Range   Prothrombin Time 15.9 (H) 11.4 - 15.2 seconds   INR 1.28     Comment: Performed at [facility], [place] [address], [place], [place] [address]  Procalcitonin - Baseline     Status: None   Collection Time: 02/28/17  4:23 AM  Result Value Ref Range   Procalcitonin 0.38 ng/mL    Comment:        Interpretation: PCT (Procalcitonin) <= 0.5 ng/mL: Systemic infection (sepsis) is not likely. Local bacterial infection is possible. (NOTE)       Sepsis PCT Algorithm           Lower Respiratory Tract                                      Infection PCT Algorithm    ----------------------------     ----------------------------         PCT < 0.25 ng/mL                PCT < 0.10 ng/mL         Strongly encourage             Strongly discourage   discontinuation of antibiotics    initiation of antibiotics     ----------------------------     -----------------------------       PCT 0.25 - 0.50 ng/mL            PCT 0.10 - 0.25 ng/mL               OR       >80% decrease in PCT            Discourage initiation of  antibiotics      Encourage discontinuation           of antibiotics    ----------------------------     -----------------------------         PCT >= 0.50 ng/mL              PCT 0.26 - 0.50 ng/mL               AND        <80% decrease in PCT             Encourage initiation of                                             antibiotics       Encourage continuation           of antibiotics    ----------------------------     -----------------------------        PCT >= 0.50 ng/mL                  PCT > 0.50 ng/mL               AND         increase in PCT                  Strongly encourage                                      initiation of antibiotics    Strongly encourage escalation           of antibiotics                                     -----------------------------                                           PCT <= 0.25 ng/mL                                                 OR                                        > 80% decrease in PCT                                     Discontinue / Do not initiate                                             antibiotics Performed at [place] [address], [place], [place] [address]   Respiratory Panel by PCR     Status: None   Collection Time:  02/28/17  4:14 PM  Result Value Ref Range   Adenovirus NOT DETECTED NOT DETECTED   Coronavirus 229E NOT DETECTED NOT DETECTED   Coronavirus HKU1 NOT DETECTED NOT DETECTED   Coronavirus NL63 NOT DETECTED NOT DETECTED   Coronavirus OC43 NOT DETECTED NOT DETECTED   Metapneumovirus NOT DETECTED NOT DETECTED   Rhinovirus / Enterovirus NOT DETECTED NOT DETECTED   Influenza A NOT DETECTED NOT DETECTED   Influenza B NOT DETECTED NOT DETECTED    Parainfluenza Virus 1 NOT DETECTED NOT DETECTED   Parainfluenza Virus 2 NOT DETECTED NOT DETECTED   Parainfluenza Virus 3 NOT DETECTED NOT DETECTED   Parainfluenza Virus 4 NOT DETECTED NOT DETECTED   Respiratory Syncytial Virus NOT DETECTED NOT DETECTED   Bordetella pertussis NOT DETECTED NOT DETECTED   Chlamydophila pneumoniae NOT DETECTED NOT DETECTED   Mycoplasma pneumoniae NOT DETECTED NOT DETECTED    Comment: Performed at [facility], [place] [address] [place], [place] [address]  Procalcitonin     Status: None   Collection Time: 03/01/17  4:31 AM  Result Value Ref Range   Procalcitonin 1.27 ng/mL    Comment:        Interpretation: PCT > 0.5 ng/mL and <= 2 ng/mL: Systemic infection (sepsis) is possible, but other conditions are known to elevate PCT as well. (NOTE)       Sepsis PCT Algorithm           Lower Respiratory Tract                                      Infection PCT Algorithm    ----------------------------     ----------------------------         PCT < 0.25 ng/mL                PCT < 0.10 ng/mL         Strongly encourage             Strongly discourage   discontinuation of antibiotics    initiation of antibiotics    ----------------------------     -----------------------------       PCT 0.25 - 0.50 ng/mL            PCT 0.10 - 0.25 ng/mL               OR       >80% decrease in PCT            Discourage initiation of                                            antibiotics      Encourage discontinuation           of antibiotics    ----------------------------     -----------------------------         PCT >= 0.50 ng/mL              PCT 0.26 - 0.50 ng/mL                AND       <80% decrease in PCT             Encourage initiation of  antibiotics       Encourage continuation           of antibiotics    ----------------------------     -----------------------------        PCT >= 0.50 ng/mL                  PCT > 0.50  ng/mL               AND         increase in PCT                  Strongly encourage                                      initiation of antibiotics    Strongly encourage escalation           of antibiotics                                     -----------------------------                                           PCT <= 0.25 ng/mL                                                 OR                                        > 80% decrease in PCT                                     Discontinue / Do not initiate                                             antibiotics Performed at [place] [address] [place], [place] [address]   CBC     Status: Abnormal   Collection Time: 03/01/17  4:31 AM  Result Value Ref Range   WBC 11.9 (H) 4.0 - 10.5 K/uL   RBC 3.41 (L) 4.22 - 5.81 MIL/uL   Hemoglobin 11.4 (L) 13.0 - 17.0 g/dL   HCT 33.7 (L) 39.0 - 52.0 %   MCV 98.8 78.0 - 100.0 fL   MCH 33.4 26.0 - 34.0 pg   MCHC 33.8 30.0 - 36.0 g/dL   RDW 16.7 (H) 11.5 - 15.5 %   Platelets 202 150 - 400 K/uL    Comment: Performed at [facility], [place] [address], [place], [place] [address]  Basic metabolic panel     Status: Abnormal   Collection Time: 03/01/17  4:31 AM  Result Value Ref Range   Sodium 135 135 - 145 mmol/L   Potassium 4.2 3.5 - 5.1 mmol/L   Chloride 106  101 - 111 mmol/L   CO2 19 (L) 22 - 32 mmol/L   Glucose, Bld 107 (H) 65 - 99 mg/dL   BUN 29 (H) 6 - 20 mg/dL   Creatinine, Ser 1.82 (H) 0.61 - 1.24 mg/dL   Calcium 8.1 (L) 8.9 - 10.3 mg/dL   GFR calc non Af Amer 33 (L) >60 mL/min   GFR calc Af Amer 38 (L) >60 mL/min    Comment: (NOTE) The eGFR has been calculated using the CKD EPI equation. This calculation has not been validated in all clinical situations. eGFR's persistently <60 mL/min signify possible Chronic Kidney Disease.    Anion gap 10 5 - 15    Comment: Performed at [facility], [place] [address] [place], [place]  [address]  Magnesium     Status: None   Collection Time: 03/01/17  4:31 AM  Result Value Ref Range   Magnesium 2.1 1.7 - 2.4 mg/dL    Comment: Performed at [facility], [place] [address], [place], [place] [address]  MRSA PCR Screening     Status: None   Collection Time: 03/01/17  9:40 AM  Result Value Ref Range   MRSA by PCR NEGATIVE NEGATIVE    Comment:        The GeneXpert MRSA Assay (FDA approved for NASAL specimens only), is one component of a comprehensive MRSA colonization surveillance program. It is not intended to diagnose MRSA infection nor to guide or monitor treatment for MRSA infections. Performed at [facility], [place] [address] [place],  [address]    Dg Chest 2 View  Result Date: 02/27/2017 CLINICAL DATA:  Cough for 2 months EXAM: CHEST  2 VIEW COMPARISON:  01/18/2017 FINDINGS: Cardiac shadow is stable. The lungs are well aerated bilaterally. Diffuse interstitial changes are noted as well as fullness in the region of the right hilum consistent with acute infiltrate. No sizable effusion is seen. No bony abnormality is noted. IMPRESSION: Right basilar infiltrate as well as some superimposed interstitial change new from the prior exam. Continued follow-up is recommended. Electronically Signed   By: Inez Catalina M.D.   On: 02/27/2017 15:39   Ct Angio Chest Pe W And/or Wo Contrast  Result Date: 02/27/2017 CLINICAL DATA:  Cough.  Weight loss.  Abdominal pain. EXAM: CT ANGIOGRAPHY CHEST CT ABDOMEN AND PELVIS WITH CONTRAST TECHNIQUE: Multidetector CT imaging of the chest was performed using the standard protocol during bolus administration of intravenous contrast. Multiplanar CT image reconstructions and MIPs were obtained to evaluate the vascular anatomy. Multidetector CT imaging of the abdomen and pelvis was performed using the standard protocol during bolus administration of intravenous contrast. CONTRAST:  104m ISOVUE-370 IOPAMIDOL  (ISOVUE-370) INJECTION 76% COMPARISON:  Chest radiograph from earlier today. 02/25/2015 renal sonogram. FINDINGS: CTA CHEST FINDINGS Cardiovascular: The study is low to moderate quality for the evaluation of pulmonary embolism, with evaluation of the segmental and subsegmental pulmonary artery branches significantly limited by motion artifact. There are no convincing filling defects in the central, lobar, segmental or subsegmental pulmonary artery branches to suggest acute pulmonary embolism. Atherosclerotic nonaneurysmal thoracic aorta. Dilated main pulmonary artery (3.8 cm diameter). Normal heart size. No significant pericardial fluid/thickening. Left anterior descending and right coronary atherosclerosis. Mediastinum/Nodes: No discrete thyroid nodules. Mildly patulous and otherwise grossly normal esophagus. Mild bilateral axillary adenopathy measuring up to 1.1 cm on the left (series 5/image 23) and 1.0 cm on the right (series 5/image 30). Mildly enlarged level 2 neck lymph nodes bilaterally, largest  1.1 cm on the right (series 5/image 3). Right paratracheal adenopathy measuring up to 1.7 cm (series 5/image 45). Enlarged 1.8 cm subcarinal node (series 5/image 54). AP window adenopathy measuring up to 1.3 cm (series 5/image 46). Enlarged left prevascular anterior mediastinal nodes up to 1.1 cm (series 5/image 43). Prominently enlarged 2.8 cm right hilar node (series 5/image 56). Enlarged left hilar nodes up to 1.4 cm (series 5/image 57). Lungs/Pleura: No pneumothorax. Small dependent bilateral pleural effusions. Prominent interlobular septal thickening and patchy ground-glass attenuation throughout both lungs. Right infrahilar 3.9 x 2.4 cm mass/adenopathy (series 5/image 71). Numerous poorly marginated subsolid pulmonary nodules throughout both lungs, for example a 1.8 cm inferior right middle lobe nodule (series 4/image 92), a 2.7 cm posterior right lower lobe nodule (series 4/image 70) and a 0.8 cm left upper  lobe nodule (series 4/image 36). Musculoskeletal: No aggressive appearing focal osseous lesions. Marked thoracic spondylosis. Review of the MIP images confirms the above findings. CT ABDOMEN and PELVIS FINDINGS Motion degraded scan. Hepatobiliary: Normal liver size. A few scattered subcentimeter hypodense lesions throughout the liver, too small to characterize. Layering cholelithiasis with no gallbladder wall thickening or pericholecystic fluid. No biliary ductal dilatation. Pancreas: Normal, with no mass or duct dilation. Spleen: Normal size. No mass. Adrenals/Urinary Tract: Normal adrenals. Nonobstructing left nephrolithiasis measuring 12 mm in the interpolar left kidney and 10 mm in the lower left kidney. No hydronephrosis. Multiple simple bilateral renal cysts, largest 6.7 cm in the upper right kidney and 5.4 cm in the interpolar left kidney. Additional subcentimeter hypodense renal cortical lesions in both kidneys are too small to characterize and require no follow-up. Mild diffuse bladder trabeculation with a few small bladder diverticula, largest 2.2 cm in the anterior right bladder wall. Stomach/Bowel: Normal non-distended stomach. Normal caliber small bowel with no small bowel wall thickening. Appendix not discretely visualized. No pericecal inflammatory changes. Normal large bowel with no diverticulosis, large bowel wall thickening or pericolonic fat stranding. Vascular/Lymphatic: Atherosclerotic abdominal aorta with ectatic 2.6 cm infrarenal abdominal aorta. Patent portal, splenic, hepatic and renal veins. Left para-aortic adenopathy measuring up to 1.1 cm (series 16/image 19). Enlarged 1.2 cm aortocaval node (series 16/image 21). Bilateral common iliac, external iliac and borderline inguinal lymphadenopathy. Representative enlarged 1.8 cm left common iliac node (series 16/image 29). Representative enlarged 2.3 cm left external iliac node (series 16/image 44). Representative borderline enlarged 1.3 cm  left inguinal node (series 16/image 51). Mildly enlarged bilateral upper retroperitoneal nodes between the adrenal glands and diaphragmatic cru, largest 1.4 cm on the right (series 5/image 98). Reproductive: Markedly enlarged prostate with nonspecific internal prostatic calcifications. Other: No pneumoperitoneum, ascites or focal fluid collection. Musculoskeletal: No aggressive appearing focal osseous lesions. Marked lumbar spondylosis. Nonspecific subcentimeter sclerotic foci in L1, L3 and L4 vertebral bodies. Review of the MIP images confirms the above findings. IMPRESSION: 1. Motion degraded scan.  No evidence of pulmonary embolism. 2. Widespread lymphadenopathy involving the bilateral neck, bilateral axilla, bilateral mediastinum, bilateral hilum, retroperitoneum and bilateral pelvis. Poorly marginated pulmonary nodules and interlobular septal thickening throughout both lungs. Lymphoma is favored. Widely metastatic primary bronchogenic carcinoma is on the differential given the asymmetrically bulky right hilar/right infrahilar adenopathy, but is considered less likely. 3. Small dependent bilateral pleural effusions. 4. Dilated main pulmonary artery, suggesting pulmonary arterial hypertension. 5. Markedly enlarged prostate. Diffuse bladder trabeculation and small bladder diverticula compatible with chronic bladder outlet obstruction. Nonobstructing left nephrolithiasis. No hydronephrosis. 6. Aortic Atherosclerosis (ICD10-I70.0). Ectatic infrarenal 2.6 cm abdominal aorta at risk for aneurysm  development. Recommend followup by ultrasound in 5 years. This recommendation follows ACR consensus guidelines: White Paper of the ACR Incidental Findings Committee II on Vascular Findings. J Am Coll Radiol 2013; 10:789-794. 7. Cholelithiasis. Electronically Signed   By: Ilona Sorrel M.D.   On: 02/27/2017 17:42   Ct Abdomen Pelvis W Contrast  Result Date: 02/27/2017 CLINICAL DATA:  Cough.  Weight loss.  Abdominal pain.  EXAM: CT ANGIOGRAPHY CHEST CT ABDOMEN AND PELVIS WITH CONTRAST TECHNIQUE: Multidetector CT imaging of the chest was performed using the standard protocol during bolus administration of intravenous contrast. Multiplanar CT image reconstructions and MIPs were obtained to evaluate the vascular anatomy. Multidetector CT imaging of the abdomen and pelvis was performed using the standard protocol during bolus administration of intravenous contrast. CONTRAST:  43m ISOVUE-370 IOPAMIDOL (ISOVUE-370) INJECTION 76% COMPARISON:  Chest radiograph from earlier today. 02/25/2015 renal sonogram. FINDINGS: CTA CHEST FINDINGS Cardiovascular: The study is low to moderate quality for the evaluation of pulmonary embolism, with evaluation of the segmental and subsegmental pulmonary artery branches significantly limited by motion artifact. There are no convincing filling defects in the central, lobar, segmental or subsegmental pulmonary artery branches to suggest acute pulmonary embolism. Atherosclerotic nonaneurysmal thoracic aorta. Dilated main pulmonary artery (3.8 cm diameter). Normal heart size. No significant pericardial fluid/thickening. Left anterior descending and right coronary atherosclerosis. Mediastinum/Nodes: No discrete thyroid nodules. Mildly patulous and otherwise grossly normal esophagus. Mild bilateral axillary adenopathy measuring up to 1.1 cm on the left (series 5/image 23) and 1.0 cm on the right (series 5/image 30). Mildly enlarged level 2 neck lymph nodes bilaterally, largest 1.1 cm on the right (series 5/image 3). Right paratracheal adenopathy measuring up to 1.7 cm (series 5/image 45). Enlarged 1.8 cm subcarinal node (series 5/image 54). AP window adenopathy measuring up to 1.3 cm (series 5/image 46). Enlarged left prevascular anterior mediastinal nodes up to 1.1 cm (series 5/image 43). Prominently enlarged 2.8 cm right hilar node (series 5/image 56). Enlarged left hilar nodes up to 1.4 cm (series 5/image 57).  Lungs/Pleura: No pneumothorax. Small dependent bilateral pleural effusions. Prominent interlobular septal thickening and patchy ground-glass attenuation throughout both lungs. Right infrahilar 3.9 x 2.4 cm mass/adenopathy (series 5/image 71). Numerous poorly marginated subsolid pulmonary nodules throughout both lungs, for example a 1.8 cm inferior right middle lobe nodule (series 4/image 92), a 2.7 cm posterior right lower lobe nodule (series 4/image 70) and a 0.8 cm left upper lobe nodule (series 4/image 36). Musculoskeletal: No aggressive appearing focal osseous lesions. Marked thoracic spondylosis. Review of the MIP images confirms the above findings. CT ABDOMEN and PELVIS FINDINGS Motion degraded scan. Hepatobiliary: Normal liver size. A few scattered subcentimeter hypodense lesions throughout the liver, too small to characterize. Layering cholelithiasis with no gallbladder wall thickening or pericholecystic fluid. No biliary ductal dilatation. Pancreas: Normal, with no mass or duct dilation. Spleen: Normal size. No mass. Adrenals/Urinary Tract: Normal adrenals. Nonobstructing left nephrolithiasis measuring 12 mm in the interpolar left kidney and 10 mm in the lower left kidney. No hydronephrosis. Multiple simple bilateral renal cysts, largest 6.7 cm in the upper right kidney and 5.4 cm in the interpolar left kidney. Additional subcentimeter hypodense renal cortical lesions in both kidneys are too small to characterize and require no follow-up. Mild diffuse bladder trabeculation with a few small bladder diverticula, largest 2.2 cm in the anterior right bladder wall. Stomach/Bowel: Normal non-distended stomach. Normal caliber small bowel with no small bowel wall thickening. Appendix not discretely visualized. No pericecal inflammatory changes. Normal large bowel with  no diverticulosis, large bowel wall thickening or pericolonic fat stranding. Vascular/Lymphatic: Atherosclerotic abdominal aorta with ectatic 2.6 cm  infrarenal abdominal aorta. Patent portal, splenic, hepatic and renal veins. Left para-aortic adenopathy measuring up to 1.1 cm (series 16/image 19). Enlarged 1.2 cm aortocaval node (series 16/image 21). Bilateral common iliac, external iliac and borderline inguinal lymphadenopathy. Representative enlarged 1.8 cm left common iliac node (series 16/image 29). Representative enlarged 2.3 cm left external iliac node (series 16/image 44). Representative borderline enlarged 1.3 cm left inguinal node (series 16/image 51). Mildly enlarged bilateral upper retroperitoneal nodes between the adrenal glands and diaphragmatic cru, largest 1.4 cm on the right (series 5/image 98). Reproductive: Markedly enlarged prostate with nonspecific internal prostatic calcifications. Other: No pneumoperitoneum, ascites or focal fluid collection. Musculoskeletal: No aggressive appearing focal osseous lesions. Marked lumbar spondylosis. Nonspecific subcentimeter sclerotic foci in L1, L3 and L4 vertebral bodies. Review of the MIP images confirms the above findings. IMPRESSION: 1. Motion degraded scan.  No evidence of pulmonary embolism. 2. Widespread lymphadenopathy involving the bilateral neck, bilateral axilla, bilateral mediastinum, bilateral hilum, retroperitoneum and bilateral pelvis. Poorly marginated pulmonary nodules and interlobular septal thickening throughout both lungs. Lymphoma is favored. Widely metastatic primary bronchogenic carcinoma is on the differential given the asymmetrically bulky right hilar/right infrahilar adenopathy, but is considered less likely. 3. Small dependent bilateral pleural effusions. 4. Dilated main pulmonary artery, suggesting pulmonary arterial hypertension. 5. Markedly enlarged prostate. Diffuse bladder trabeculation and small bladder diverticula compatible with chronic bladder outlet obstruction. Nonobstructing left nephrolithiasis. No hydronephrosis. 6. Aortic Atherosclerosis (ICD10-I70.0). Ectatic  infrarenal 2.6 cm abdominal aorta at risk for aneurysm development. Recommend followup by ultrasound in 5 years. This recommendation follows ACR consensus guidelines: White Paper of the ACR Incidental Findings Committee II on Vascular Findings. J Am Coll Radiol 2013; 10:789-794. 7. Cholelithiasis. Electronically Signed   By: Ilona Sorrel M.D.   On: 02/27/2017 17:42   [place] Core Biopsy (lymph Nodes)  Result Date: 02/28/2017 INDICATION: No known primary, now with extensive lymphadenopathy worrisome for lymphoma. Please perform ultrasound-guided biopsy of dominant left inguinal lymph node for tissue diagnostic purposes. EXAM: ULTRASOUND-GUIDED LEFT INGUINAL LYMPH NODE BIOPSY COMPARISON:  CT the chest, abdomen and pelvis - 02/27/2017 MEDICATIONS: None ANESTHESIA/SEDATION: Moderate (conscious) sedation was employed during this procedure. A total of Versed 0.5 mg and Fentanyl 25 mcg was administered intravenously. Moderate Sedation Time: 10 minutes. The patient's level of consciousness and vital signs were monitored continuously by radiology nursing throughout the procedure under my direct supervision. COMPLICATIONS: None immediate. TECHNIQUE: Informed written consent was obtained from the patient after a discussion of the risks, benefits and alternatives to treatment. Questions regarding the procedure were encouraged and answered. Initial ultrasound scanning demonstrated an approximately 2.2 x 1.3 cm left inguinal lymph node correlating with the dominant lymph node seen on preceding abdominal CT image 51, series 16). An ultrasound image was saved for documentation purposes. The procedure was planned. A timeout was performed prior to the initiation of the procedure. The operative was prepped and draped in the usual sterile fashion, and a sterile drape was applied covering the operative field. A timeout was performed prior to the initiation of the procedure. Local anesthesia was provided with 1% lidocaine with  epinephrine. Under direct ultrasound guidance, an 18 gauge core needle device was utilized to obtain to obtain 5 core needle biopsies of the dominant left inguinal lymph node. The samples were placed in saline and submitted to pathology. The needle was removed and hemostasis was achieved with manual compression. Post  procedure scan was negative for significant hematoma. A dressing was placed. The patient tolerated the procedure well without immediate postprocedural complication. IMPRESSION: Technically successful ultrasound guided biopsy of dominant left inguinal lymph node. Electronically Signed   By: Sandi Mariscal M.D.   On: 02/28/2017 13:58   Anti-infectives (From admission, onward)   Start     Dose/Rate Route Frequency Ordered Stop   03/01/17 1300  ceFEPIme (MAXIPIME) 1 g in sodium chloride 0.9 % 100 mL IVPB     1 g 200 mL/hr over 30 Minutes Intravenous 2 times daily 03/01/17 1202     02/28/17 1800  cefTRIAXone (ROCEPHIN) 1 g in sodium chloride 0.9 % 100 mL IVPB  Status:  Discontinued     1 g 200 mL/hr over 30 Minutes Intravenous Every 24 hours 02/27/17 2205 03/01/17 1151   02/28/17 1800  azithromycin (ZITHROMAX) 500 mg in sodium chloride 0.9 % 250 mL IVPB     500 mg 250 mL/hr over 60 Minutes Intravenous Every 24 hours 02/27/17 2205 03/07/17 1759   02/27/17 1824  azithromycin (ZITHROMAX) 500 MG injection    Comments:  Peel, Adrienne   : cabinet override      02/27/17 1824 02/28/17 0629   02/27/17 1800  cefTRIAXone (ROCEPHIN) 1 g in sodium chloride 0.9 % 100 mL IVPB     1 g 200 mL/hr over 30 Minutes Intravenous  Once 02/27/17 1757 02/27/17 1905   02/27/17 1800  azithromycin (ZITHROMAX) 500 mg in sodium chloride 0.9 % 250 mL IVPB     500 mg 250 mL/hr over 60 Minutes Intravenous  Once 02/27/17 1757 02/27/17 2034        Assessment/Plan CKD RA chronically on steroids HTN HLD PNA - on   General lymphadenopathy - Oncology requesting excisional right axillary lymph node biopsy. Patient  has been SOB and intermittently hypoxic. Currently on supplemental oxygen. Discussed with Dr. Florene Glen of internal medicine who recommended that we continue trying to improve his O2 sats prior to surgery, and wean from supplemental oxygen. Spoke with anesthesia to see if this procedure could be done anesthesia other than general, but he did not feel that would be possible. Patient anxious to proceed with procedure despite the risks of general anesthesia.  Continue antibiotics for pneumonia for now. Would recommend waiting at least a couple days to see if treatment of pneumonia improves his oxygenation, but patient does not want to do this. I will make him NPO after midnight and he can discuss the risks/benefits of proceeding with surgery with MD's tomorrow.  ID - maxipime 2/15>>, azithromycin 2/13>>, rocephin 2/13>>2/15 VTE - SCDs FEN - regular diet Foley - none Follow up - TBD  Wellington Hampshire, [facility] 03/01/2017, 2:45 PM Pager: [phone] Consults: [phone] Mon-Fri 7:00 am-4:30 pm Sat-Sun 7:00 am-11:30 am

## 2017-03-01 NOTE — Evaluation (Addendum)
Physical Therapy Evaluation Patient Details Name: Jose Meza MRN: [id number] DOB: 06/22/1931 Today's Date: 03/01/2017   History of Present Illness   84 y.o. male with medical history significant of chronic kidney disease, rheumatoid arthritis chronically on steroids, hypertension transferred here from [facility] after finding diffuse disease in his chest concerning for lymphoma.  Clinical Impression  Pt admitted with above diagnosis. Pt currently with functional limitations due to the deficits listed below (see PT Problem List). Pt ambulated 28' with RW, no loss of balance, SaO2 dropped to 70% on room air walking, HR 110, SaO2 up to 95% on room air after 2 minutes rest. 3/4 dyspnea and fatigue limited activity tolerance. Encouraged pt to ambulate with assistance several times a day to minimize deconditioning.  Pt will benefit from skilled PT to increase their independence and safety with mobility to allow discharge to the venue listed below.       Follow Up Recommendations Home health PT    Equipment Recommendations  None recommended by PT    Recommendations for Other Services       Precautions / Restrictions Precautions Precautions: Fall Precaution Comments: denies falls in past 1 year Restrictions Weight Bearing Restrictions: No      Mobility  Bed Mobility Overal bed mobility: Modified Independent             General bed mobility comments: HOB up 35*  Transfers Overall transfer level: Needs assistance Equipment used: Rolling walker (2 wheeled) Transfers: Sit to/from Stand Sit to Stand: Min guard;From elevated surface         General transfer comment: bed elevated, VCs for hand placement  Ambulation/Gait Ambulation/Gait assistance: Min guard Ambulation Distance (Feet): 28 Feet Assistive device: Rolling walker (2 wheeled) Gait Pattern/deviations: Step-through pattern;Decreased stride length   Gait velocity interpretation: Below normal speed for  age/gender General Gait Details: steady with RW, distance limited by 3/4 dyspnea/fatigue, SaO2 71% on room air walking, 95% on room air after 2 minutes supine rest, HR 110 walking  Stairs            Wheelchair Mobility    Modified Rankin (Stroke Patients Only)       Balance Overall balance assessment: Modified Independent                                           Pertinent Vitals/Pain Pain Assessment: No/denies pain    Home Living Family/patient expects to be discharged to:: (P) Private residence Living Arrangements: (P) Alone Available Help at Discharge: (P) Family;Available PRN/intermittently(daughter is local but works, son came in from out of town)   Home Access: (P) Level entry     Home Layout: (P) One level [place]: (P) [place] - single point;Walker - 2 wheels;Grab bars - tub/shower;Grab bars - toilet      Prior Function Level of Independence: (P) Independent with assistive device(s)         Comments: (P) usually uses cane but lately has used RW, independent ADLs     Hand Dominance        Extremity/Trunk Assessment   Upper Extremity Assessment Upper Extremity Assessment: Overall WFL for tasks assessed    Lower Extremity Assessment Lower Extremity Assessment: Overall WFL for tasks assessed    Cervical / Trunk Assessment Cervical / Trunk Assessment: Normal  Communication   Communication: (P) No difficulties  Cognition Arousal/Alertness: Awake/alert Behavior During  Therapy: WFL for tasks assessed/performed Overall Cognitive Status: Within Functional Limits for tasks assessed                                        General Comments      Exercises     Assessment/Plan    PT Assessment Patient needs continued PT services  PT Problem List Decreased activity tolerance;Cardiopulmonary status limiting activity;Decreased mobility       PT Treatment Interventions Gait training;Therapeutic  activities;Therapeutic exercise;Patient/family education    PT Goals (Current goals can be found in the Care Plan section)  Acute Rehab PT Goals Patient Stated Goal: play bridge PT Goal Formulation: With patient Time For Goal Achievement: 03/15/17 Potential to Achieve Goals: Good    Frequency Min 3X/week   Barriers to discharge        Co-evaluation               AM-PAC PT "6 Clicks" Daily Activity  Outcome Measure Difficulty turning over in bed (including adjusting bedclothes, sheets and blankets)?: A Little Difficulty moving from lying on back to sitting on the side of the bed? : A Little Difficulty sitting down on and standing up from a chair with arms (e.g., wheelchair, bedside commode, etc,.)?: A Little Help needed moving to and from a bed to chair (including a wheelchair)?: A Little Help needed walking in hospital room?: A Little Help needed climbing 3-5 steps with a railing? : A Lot 6 Click Score: 17    End of Session Equipment Utilized During Treatment: Gait belt Activity Tolerance: Patient limited by fatigue;Treatment limited secondary to medical complications (Comment)(SaO2 70% on room air walking) Patient left: in bed;with call bell/phone within reach;with family/visitor present Nurse Communication: Mobility status;Other (comment)(ambulating O2 sats 70% room air) PT Visit Diagnosis: Difficulty in walking, not elsewhere classified (R26.2)    Time: 3818-2993 PT Time Calculation (min) (ACUTE ONLY): 28 min   Charges:   PT Evaluation $PT Eval Moderate Complexity: 1 Mod PT Treatments $Gait Training: 8-22 mins   PT G Codes:          Philomena Doheny 03/01/2017, 10:21 AM [phone]

## 2017-03-01 NOTE — Progress Notes (Addendum)
PROGRESS NOTE    Jose Meza  HUD:[id number] DOB: 12-10-1931 DOA: 02/27/2017 PCP: Lajean Manes, MD   Brief Narrative:  Jose Meza is Malan Werk 84 y.o. male with medical history significant of chronic kidney disease, rheumatoid arthritis chronically on steroids, hypertension transferred here from [facility] after finding use disease in his chest concerning for lymphoma.  Patient arrived there with complaints of coughing for over Kajah Santizo week with chills and low-grade fevers at home.  Is lost over 20 pounds in the last 4-6 weeks.  He is recently been treated for prostatitis with an unknown antibiotic Giomar Gusler month ago.  His symptoms have persisted however despite recent steroid taper for bronchitis also.  He denies any nausea vomiting or diarrhea.  He has not noticed any swollen areas on his body lately.  He denies any rashes.  CTA was done which showed diffuse disease concerning for lymphoma with possible pneumonia.  Patient is referred for admission for hypoxia with 90% O2 sats on room air along with possible pneumonia.  He denies any pain.  Assessment & Plan:   Principal Problem:   PNA (pneumonia) Active Problems:   Hyperlipidemia   Hypertension   CKD (chronic kidney disease) stage 3, GFR 30-59 ml/min (HCC)   Rheumatoid arthritis of multiple sites with negative rheumatoid factor (HCC)   Pneumonia  Acute Hypoxic Respiratory Failure:  Currently being treated for community acquired pneumonia, but patient also with widepread lymphadenopathy and pulmonary nodules and interlobular septal thickening throughout lungs which could be contributing.  Immunocompromised with treatment for RA on xeljanz, recently treated with steroids as well for past few days.  Of note, neg quant gold 10/2016 and denies risk factors for exposure.    Ceftriaxone (2/13-2/15) and azithromycin (2/13 - )  Broadening to cefepime (2/15 - ) given worsening PCT and reports of recent abx use for prostatitis (can't tell me  which) MRSA PCR pending  Sputum cx pending, urine strep negative, urine legionella pending Negative influenza RVP negative PCT 0.38, rising  Will consider adding on prednisone as well for pneumonia  Diffuse Lymphadenopathy  Weight Loss  Fevers: imaging findings concerning for lymphoma per radiology.  He notes weight loss and low grade temps over past several months.  Denies night sweats.  Also on differential based on imaging findings is metastatic primary bronchogenic carcinoma.    S/p biopsy of inguinal LN by IR Will follow results of biopsy, c/s oncology pending results and symptoms  Nutrition  UTI: UA nitrite positive, bacteria, and 6-30 WBC, though with positive squams.  Follow cx.  Continue ceftriaxone. Hx of prostatitis.  Ucx with gram negative rods and another unidentified organism.  Follow.   Tachycardia: sinus.  EKG appeared similar to priors. Place on telemetry.  Improved yesterday after metop and IVF and similarly has improved this morning as well.  Will increase IVF.  Continue metop.   Anemia: slightly downtrended, likely 2/2 IVF.  Continue to monitor  Leukocytosis: 2/2 above, continue to monitor  Rheumatoid Arthritis:  Hold xeljanz  CKD Stage III: baseline creatinine looks to be between 1.5-1.7. Mildly elevated today to 1.81.  Continue to monitor.   HTN: metoprolol  GERD: PPI  Anxiety: ativan bid prn, remeron added for insomnia and potentially to help with appetite as well  Ectatic Infrarenal Abdominal Aorta: follow up as noted per rads  DVT prophylaxis: SCDs Code Status: full code Family Communication: discussed with daughter 2/14 Disposition Plan: pending improvement in resp status and bx results   Consultants:  IR  Procedures:   US Guided Biopsy of L inguinal LN 02/28/17  Antimicrobials:  Anti-infectives (From admission, onward)   Start     Dose/Rate Route Frequency Ordered Stop   02/28/17 1800  cefTRIAXone (ROCEPHIN) 1 g in sodium chloride 0.9 %  100 mL IVPB     1 g 200 mL/hr over 30 Minutes Intravenous Every 24 hours 02/27/17 2205 03/07/17 1759   02/28/17 1800  azithromycin (ZITHROMAX) 500 mg in sodium chloride 0.9 % 250 mL IVPB     500 mg 250 mL/hr over 60 Minutes Intravenous Every 24 hours 02/27/17 2205 03/07/17 1759   02/27/17 1824  azithromycin (ZITHROMAX) 500 MG injection    Comments:  Peel, Adrienne   : cabinet override      02/27/17 1824 02/28/17 0629   02/27/17 1800  cefTRIAXone (ROCEPHIN) 1 g in sodium chloride 0.9 % 100 mL IVPB     1 g 200 mL/hr over 30 Minutes Intravenous  Once 02/27/17 1757 02/27/17 1905   02/27/17 1800  azithromycin (ZITHROMAX) 500 mg in sodium chloride 0.9 % 250 mL IVPB     500 mg 250 mL/hr over 60 Minutes Intravenous  Once 02/27/17 1757 02/27/17 2034     Subjective: Notes he has chills off and on for years. Thought it was prostatitis.  Last treated Yeraldi Fidler few weeks ago, not sure of abx. SOB described as congestion in nose mostly, in chest notes difficulty bringing up phlegm.   Objective: Vitals:   02/28/17 1605 02/28/17 2004 03/01/17 0508 03/01/17 0735  BP: (!) 101/50 109/62 132/68 (!) 104/43  Pulse: 88 (!) 120 (!) 112 (!) 124  Resp: 18 (!) 22 (!) 24 20  Temp: (!) 97.5 F (36.4 C) (!) 100.4 F (38 C) 97.8 F (36.6 C) 99.7 F (37.6 C)  TempSrc: Oral Oral Oral Oral  SpO2: 99% 94% 91% 96%  Weight:      Height:        Intake/Output Summary (Last 24 hours) at 03/01/2017 0814 Last data filed at 03/01/2017 0413 Gross per 24 hour  Intake 1650 ml  Output 500 ml  Net 1150 ml   Filed Weights   02/27/17 1503 02/27/17 2210  Weight: 88 kg (194 lb) 90.8 kg (200 lb 2.8 oz)    Examination:  General exam: Appears calm and comfortable  Respiratory system: Slightly increased WOB, diffuse mild crackles.   Cardiovascular system: Tachycardic.  S1 & S2 heard, RRR. No JVD, murmurs, rubs, gallops or clicks. No pedal edema. Gastrointestinal system: Abdomen is nondistended, soft and nontender. No  organomegaly or masses felt. Normal bowel sounds heard. Central nervous system: Alert and oriented. No focal neurological deficits. Extremities: Symmetric 5 x 5 power. Skin: No rashes, lesions or ulcers Psychiatry: Judgement and insight appear normal. Mood & affect appropriate.     Data Reviewed: I have personally reviewed following labs and imaging studies  CBC: Recent Labs  Lab 02/27/17 1514 02/28/17 0423 03/01/17 0431  WBC 11.6* 10.0 11.9*  NEUTROABS 9.0* 7.8*  --   HGB 12.2* 10.9* 11.4*  HCT 35.3* 32.1* 33.7*  MCV 96.7 98.2 98.8  PLT 207 217 767   Basic Metabolic Panel: Recent Labs  Lab 02/27/17 1514 02/28/17 0423 03/01/17 0431  NA 129* 134* 135  K 4.2 4.3 4.2  CL 99* 103 106  CO2 19* 21* 19*  GLUCOSE 130* 105* 107*  BUN 30* 29* 29*  CREATININE 1.72* 1.81* 1.82*  CALCIUM 9.0 8.6* 8.1*  MG  --   --  2.1   GFR: Estimated Creatinine Clearance: 38.8 mL/min (Trestin Vences) (by C-G formula based on SCr of 1.82 mg/dL (H)). Liver Function Tests: Recent Labs  Lab 02/27/17 1514  AST 29  ALT 25  ALKPHOS 59  BILITOT 0.4  PROT 7.3  ALBUMIN 2.7*   Recent Labs  Lab 02/27/17 1514  LIPASE 46   No results for input(s): AMMONIA in the last 168 hours. Coagulation Profile: Recent Labs  Lab 02/28/17 0423  INR 1.28   Cardiac Enzymes: Recent Labs  Lab 02/27/17 1514  TROPONINI <0.03   BNP (last 3 results) No results for input(s): PROBNP in the last 8760 hours. HbA1C: No results for input(s): HGBA1C in the last 72 hours. CBG: No results for input(s): GLUCAP in the last 168 hours. Lipid Profile: No results for input(s): CHOL, HDL, LDLCALC, TRIG, CHOLHDL, LDLDIRECT in the last 72 hours. Thyroid Function Tests: Recent Labs    02/27/17 1940  TSH 6.452*   Anemia Panel: No results for input(s): VITAMINB12, FOLATE, FERRITIN, TIBC, IRON, RETICCTPCT in the last 72 hours. Sepsis Labs: Recent Labs  Lab 02/27/17 1814 02/28/17 0423 03/01/17 0431  PROCALCITON  --  0.38  1.27  LATICACIDVEN 1.29  --   --     Recent Results (from the past 240 hour(s))  Blood culture (routine x 2)     Status: None (Preliminary result)   Collection Time: 02/27/17  6:00 PM  Result Value Ref Range Status   Specimen Description   Final    BLOOD BLOOD RIGHT HAND Performed at [facility], [address] [place], [place] [address]    Special Requests   Final    BOTTLES DRAWN AEROBIC AND ANAEROBIC Blood Culture adequate volume Performed at [facility], [address] [place], [place] [address]    Culture   Final    NO GROWTH < 24 HOURS Performed at [facility], [place] [address], [place], [place] [address]    Report Status PENDING  Incomplete  Blood culture (routine x 2)     Status: None (Preliminary result)   Collection Time: 02/27/17  6:15 PM  Result Value Ref Range Status   Specimen Description   Final    BLOOD LEFT ANTECUBITAL Performed at [facility], [address] [place], [place] [address]    Special Requests   Final    BOTTLES DRAWN AEROBIC AND ANAEROBIC Blood Culture adequate volume Performed at [facility], [address] [place], [place] [address]    Culture   Final    NO GROWTH < 24 HOURS Performed at [facility], [place] [address] [place], [place] [address]    Report Status PENDING  Incomplete  Respiratory Panel by PCR     Status: None   Collection Time: 02/28/17  4:14 PM  Result Value Ref Range Status   Adenovirus NOT DETECTED NOT DETECTED Final   Coronavirus 229E NOT DETECTED NOT DETECTED Final   Coronavirus HKU1 NOT DETECTED NOT DETECTED Final   Coronavirus NL63 NOT DETECTED NOT DETECTED Final   Coronavirus OC43 NOT DETECTED NOT DETECTED Final   Metapneumovirus NOT DETECTED NOT DETECTED Final   Rhinovirus / Enterovirus NOT DETECTED NOT DETECTED Final   Influenza Makaiya Geerdes NOT DETECTED NOT DETECTED Final   Influenza B NOT DETECTED NOT DETECTED Final   Parainfluenza Virus 1 NOT DETECTED NOT  DETECTED Final   Parainfluenza Virus 2 NOT DETECTED NOT DETECTED Final   Parainfluenza  Virus 3 NOT DETECTED NOT DETECTED Final   Parainfluenza Virus 4 NOT DETECTED NOT DETECTED Final   Respiratory Syncytial Virus NOT DETECTED NOT DETECTED Final   Bordetella pertussis NOT DETECTED NOT DETECTED Final   Chlamydophila pneumoniae NOT DETECTED NOT DETECTED Final   Mycoplasma pneumoniae NOT DETECTED NOT DETECTED Final    Comment: Performed at [facility], [place] [address] [place],  [address]         Radiology Studies: Dg Chest 2 View  Result Date: 02/27/2017 CLINICAL DATA:  Cough for 2 months EXAM: CHEST  2 VIEW COMPARISON:  01/18/2017 FINDINGS: Cardiac shadow is stable. The lungs are well aerated bilaterally. Diffuse interstitial changes are noted as well as fullness in the region of the right hilum consistent with acute infiltrate. No sizable effusion is seen. No bony abnormality is noted. IMPRESSION: Right basilar infiltrate as well as some superimposed interstitial change new from the prior exam. Continued follow-up is recommended. Electronically Signed   By: Inez Catalina M.D.   On: 02/27/2017 15:39   Ct Angio Chest Pe W And/or Wo Contrast  Result Date: 02/27/2017 CLINICAL DATA:  Cough.  Weight loss.  Abdominal pain. EXAM: CT ANGIOGRAPHY CHEST CT ABDOMEN AND PELVIS WITH CONTRAST TECHNIQUE: Multidetector CT imaging of the chest was performed using the standard protocol during bolus administration of intravenous contrast. Multiplanar CT image reconstructions and MIPs were obtained to evaluate the vascular anatomy. Multidetector CT imaging of the abdomen and pelvis was performed using the standard protocol during bolus administration of intravenous contrast. CONTRAST:  24mL ISOVUE-370 IOPAMIDOL (ISOVUE-370) INJECTION 76% COMPARISON:  Chest radiograph from earlier today. 02/25/2015 renal sonogram. FINDINGS: CTA CHEST FINDINGS Cardiovascular: The study is low to moderate quality for the  evaluation of pulmonary embolism, with evaluation of the segmental and subsegmental pulmonary artery branches significantly limited by motion artifact. There are no convincing filling defects in the central, lobar, segmental or subsegmental pulmonary artery branches to suggest acute pulmonary embolism. Atherosclerotic nonaneurysmal thoracic aorta. Dilated main pulmonary artery (3.8 cm diameter). Normal heart size. No significant pericardial fluid/thickening. Left anterior descending and right coronary atherosclerosis. Mediastinum/Nodes: No discrete thyroid nodules. Mildly patulous and otherwise grossly normal esophagus. Mild bilateral axillary adenopathy measuring up to 1.1 cm on the left (series 5/image 23) and 1.0 cm on the right (series 5/image 30). Mildly enlarged level 2 neck lymph nodes bilaterally, largest 1.1 cm on the right (series 5/image 3). Right paratracheal adenopathy measuring up to 1.7 cm (series 5/image 45). Enlarged 1.8 cm subcarinal node (series 5/image 54). AP window adenopathy measuring up to 1.3 cm (series 5/image 46). Enlarged left prevascular anterior mediastinal nodes up to 1.1 cm (series 5/image 43). Prominently enlarged 2.8 cm right hilar node (series 5/image 56). Enlarged left hilar nodes up to 1.4 cm (series 5/image 57). Lungs/Pleura: No pneumothorax. Small dependent bilateral pleural effusions. Prominent interlobular septal thickening and patchy ground-glass attenuation throughout both lungs. Right infrahilar 3.9 x 2.4 cm mass/adenopathy (series 5/image 71). Numerous poorly marginated subsolid pulmonary nodules throughout both lungs, for example Perlie Scheuring 1.8 cm inferior right middle lobe nodule (series 4/image 92), Alandra Sando 2.7 cm posterior right lower lobe nodule (series 4/image 70) and Zyhir Cappella 0.8 cm left upper lobe nodule (series 4/image 36). Musculoskeletal: No aggressive appearing focal osseous lesions. Marked thoracic spondylosis. Review of the MIP images confirms the above findings. CT ABDOMEN and  PELVIS FINDINGS Motion degraded scan. Hepatobiliary: Normal liver size. Zebadiah Willert few scattered subcentimeter hypodense lesions throughout the liver, too small to characterize. Layering cholelithiasis  with no gallbladder wall thickening or pericholecystic fluid. No biliary ductal dilatation. Pancreas: Normal, with no mass or duct dilation. Spleen: Normal size. No mass. Adrenals/Urinary Tract: Normal adrenals. Nonobstructing left nephrolithiasis measuring 12 mm in the interpolar left kidney and 10 mm in the lower left kidney. No hydronephrosis. Multiple simple bilateral renal cysts, largest 6.7 cm in the upper right kidney and 5.4 cm in the interpolar left kidney. Additional subcentimeter hypodense renal cortical lesions in both kidneys are too small to characterize and require no follow-up. Mild diffuse bladder trabeculation with Alila Sotero few small bladder diverticula, largest 2.2 cm in the anterior right bladder wall. Stomach/Bowel: Normal non-distended stomach. Normal caliber small bowel with no small bowel wall thickening. Appendix not discretely visualized. No pericecal inflammatory changes. Normal large bowel with no diverticulosis, large bowel wall thickening or pericolonic fat stranding. Vascular/Lymphatic: Atherosclerotic abdominal aorta with ectatic 2.6 cm infrarenal abdominal aorta. Patent portal, splenic, hepatic and renal veins. Left para-aortic adenopathy measuring up to 1.1 cm (series 16/image 19). Enlarged 1.2 cm aortocaval node (series 16/image 21). Bilateral common iliac, external iliac and borderline inguinal lymphadenopathy. Representative enlarged 1.8 cm left common iliac node (series 16/image 29). Representative enlarged 2.3 cm left external iliac node (series 16/image 44). Representative borderline enlarged 1.3 cm left inguinal node (series 16/image 51). Mildly enlarged bilateral upper retroperitoneal nodes between the adrenal glands and diaphragmatic cru, largest 1.4 cm on the right (series 5/image 98).  Reproductive: Markedly enlarged prostate with nonspecific internal prostatic calcifications. Other: No pneumoperitoneum, ascites or focal fluid collection. Musculoskeletal: No aggressive appearing focal osseous lesions. Marked lumbar spondylosis. Nonspecific subcentimeter sclerotic foci in L1, L3 and L4 vertebral bodies. Review of the MIP images confirms the above findings. IMPRESSION: 1. Motion degraded scan.  No evidence of pulmonary embolism. 2. Widespread lymphadenopathy involving the bilateral neck, bilateral axilla, bilateral mediastinum, bilateral hilum, retroperitoneum and bilateral pelvis. Poorly marginated pulmonary nodules and interlobular septal thickening throughout both lungs. Lymphoma is favored. Widely metastatic primary bronchogenic carcinoma is on the differential given the asymmetrically bulky right hilar/right infrahilar adenopathy, but is considered less likely. 3. Small dependent bilateral pleural effusions. 4. Dilated main pulmonary artery, suggesting pulmonary arterial hypertension. 5. Markedly enlarged prostate. Diffuse bladder trabeculation and small bladder diverticula compatible with chronic bladder outlet obstruction. Nonobstructing left nephrolithiasis. No hydronephrosis. 6. Aortic Atherosclerosis (ICD10-I70.0). Ectatic infrarenal 2.6 cm abdominal aorta at risk for aneurysm development. Recommend followup by ultrasound in 5 years. This recommendation follows ACR consensus guidelines: White Paper of the ACR Incidental Findings Committee II on Vascular Findings. J Am Coll Radiol 2013; 10:789-794. 7. Cholelithiasis. Electronically Signed   By: Ilona Sorrel M.D.   On: 02/27/2017 17:42   Ct Abdomen Pelvis W Contrast  Result Date: 02/27/2017 CLINICAL DATA:  Cough.  Weight loss.  Abdominal pain. EXAM: CT ANGIOGRAPHY CHEST CT ABDOMEN AND PELVIS WITH CONTRAST TECHNIQUE: Multidetector CT imaging of the chest was performed using the standard protocol during bolus administration of intravenous  contrast. Multiplanar CT image reconstructions and MIPs were obtained to evaluate the vascular anatomy. Multidetector CT imaging of the abdomen and pelvis was performed using the standard protocol during bolus administration of intravenous contrast. CONTRAST:  69mL ISOVUE-370 IOPAMIDOL (ISOVUE-370) INJECTION 76% COMPARISON:  Chest radiograph from earlier today. 02/25/2015 renal sonogram. FINDINGS: CTA CHEST FINDINGS Cardiovascular: The study is low to moderate quality for the evaluation of pulmonary embolism, with evaluation of the segmental and subsegmental pulmonary artery branches significantly limited by motion artifact. There are no convincing filling defects in the central,  lobar, segmental or subsegmental pulmonary artery branches to suggest acute pulmonary embolism. Atherosclerotic nonaneurysmal thoracic aorta. Dilated main pulmonary artery (3.8 cm diameter). Normal heart size. No significant pericardial fluid/thickening. Left anterior descending and right coronary atherosclerosis. Mediastinum/Nodes: No discrete thyroid nodules. Mildly patulous and otherwise grossly normal esophagus. Mild bilateral axillary adenopathy measuring up to 1.1 cm on the left (series 5/image 23) and 1.0 cm on the right (series 5/image 30). Mildly enlarged level 2 neck lymph nodes bilaterally, largest 1.1 cm on the right (series 5/image 3). Right paratracheal adenopathy measuring up to 1.7 cm (series 5/image 45). Enlarged 1.8 cm subcarinal node (series 5/image 54). AP window adenopathy measuring up to 1.3 cm (series 5/image 46). Enlarged left prevascular anterior mediastinal nodes up to 1.1 cm (series 5/image 43). Prominently enlarged 2.8 cm right hilar node (series 5/image 56). Enlarged left hilar nodes up to 1.4 cm (series 5/image 57). Lungs/Pleura: No pneumothorax. Small dependent bilateral pleural effusions. Prominent interlobular septal thickening and patchy ground-glass attenuation throughout both lungs. Right infrahilar 3.9 x  2.4 cm mass/adenopathy (series 5/image 71). Numerous poorly marginated subsolid pulmonary nodules throughout both lungs, for example Pierce Biagini 1.8 cm inferior right middle lobe nodule (series 4/image 92), Aleiah Mohammed 2.7 cm posterior right lower lobe nodule (series 4/image 70) and Annaston Upham 0.8 cm left upper lobe nodule (series 4/image 36). Musculoskeletal: No aggressive appearing focal osseous lesions. Marked thoracic spondylosis. Review of the MIP images confirms the above findings. CT ABDOMEN and PELVIS FINDINGS Motion degraded scan. Hepatobiliary: Normal liver size. Brenan Modesto few scattered subcentimeter hypodense lesions throughout the liver, too small to characterize. Layering cholelithiasis with no gallbladder wall thickening or pericholecystic fluid. No biliary ductal dilatation. Pancreas: Normal, with no mass or duct dilation. Spleen: Normal size. No mass. Adrenals/Urinary Tract: Normal adrenals. Nonobstructing left nephrolithiasis measuring 12 mm in the interpolar left kidney and 10 mm in the lower left kidney. No hydronephrosis. Multiple simple bilateral renal cysts, largest 6.7 cm in the upper right kidney and 5.4 cm in the interpolar left kidney. Additional subcentimeter hypodense renal cortical lesions in both kidneys are too small to characterize and require no follow-up. Mild diffuse bladder trabeculation with Merrik Puebla few small bladder diverticula, largest 2.2 cm in the anterior right bladder wall. Stomach/Bowel: Normal non-distended stomach. Normal caliber small bowel with no small bowel wall thickening. Appendix not discretely visualized. No pericecal inflammatory changes. Normal large bowel with no diverticulosis, large bowel wall thickening or pericolonic fat stranding. Vascular/Lymphatic: Atherosclerotic abdominal aorta with ectatic 2.6 cm infrarenal abdominal aorta. Patent portal, splenic, hepatic and renal veins. Left para-aortic adenopathy measuring up to 1.1 cm (series 16/image 19). Enlarged 1.2 cm aortocaval node (series 16/image  21). Bilateral common iliac, external iliac and borderline inguinal lymphadenopathy. Representative enlarged 1.8 cm left common iliac node (series 16/image 29). Representative enlarged 2.3 cm left external iliac node (series 16/image 44). Representative borderline enlarged 1.3 cm left inguinal node (series 16/image 51). Mildly enlarged bilateral upper retroperitoneal nodes between the adrenal glands and diaphragmatic cru, largest 1.4 cm on the right (series 5/image 98). Reproductive: Markedly enlarged prostate with nonspecific internal prostatic calcifications. Other: No pneumoperitoneum, ascites or focal fluid collection. Musculoskeletal: No aggressive appearing focal osseous lesions. Marked lumbar spondylosis. Nonspecific subcentimeter sclerotic foci in L1, L3 and L4 vertebral bodies. Review of the MIP images confirms the above findings. IMPRESSION: 1. Motion degraded scan.  No evidence of pulmonary embolism. 2. Widespread lymphadenopathy involving the bilateral neck, bilateral axilla, bilateral mediastinum, bilateral hilum, retroperitoneum and bilateral pelvis. Poorly marginated pulmonary nodules and  interlobular septal thickening throughout both lungs. Lymphoma is favored. Widely metastatic primary bronchogenic carcinoma is on the differential given the asymmetrically bulky right hilar/right infrahilar adenopathy, but is considered less likely. 3. Small dependent bilateral pleural effusions. 4. Dilated main pulmonary artery, suggesting pulmonary arterial hypertension. 5. Markedly enlarged prostate. Diffuse bladder trabeculation and small bladder diverticula compatible with chronic bladder outlet obstruction. Nonobstructing left nephrolithiasis. No hydronephrosis. 6. Aortic Atherosclerosis (ICD10-I70.0). Ectatic infrarenal 2.6 cm abdominal aorta at risk for aneurysm development. Recommend followup by ultrasound in 5 years. This recommendation follows ACR consensus guidelines: White Paper of the ACR Incidental  Findings Committee II on Vascular Findings. J Am Coll Radiol 2013; 10:789-794. 7. Cholelithiasis. Electronically Signed   By: Ilona Sorrel M.D.   On: 02/27/2017 17:42   [place] Core Biopsy (lymph Nodes)  Result Date: 02/28/2017 INDICATION: No known primary, now with extensive lymphadenopathy worrisome for lymphoma. Please perform ultrasound-guided biopsy of dominant left inguinal lymph node for tissue diagnostic purposes. EXAM: ULTRASOUND-GUIDED LEFT INGUINAL LYMPH NODE BIOPSY COMPARISON:  CT the chest, abdomen and pelvis - 02/27/2017 MEDICATIONS: None ANESTHESIA/SEDATION: Moderate (conscious) sedation was employed during this procedure. Veronnica Hennings total of Versed 0.5 mg and Fentanyl 25 mcg was administered intravenously. Moderate Sedation Time: 10 minutes. The patient's level of consciousness and vital signs were monitored continuously by radiology nursing throughout the procedure under my direct supervision. COMPLICATIONS: None immediate. TECHNIQUE: Informed written consent was obtained from the patient after Laylani Pudwill discussion of the risks, benefits and alternatives to treatment. Questions regarding the procedure were encouraged and answered. Initial ultrasound scanning demonstrated an approximately 2.2 x 1.3 cm left inguinal lymph node correlating with the dominant lymph node seen on preceding abdominal CT image 51, series 16). An ultrasound image was saved for documentation purposes. The procedure was planned. Verdell Dykman timeout was performed prior to the initiation of the procedure. The operative was prepped and draped in the usual sterile fashion, and Welton Bord sterile drape was applied covering the operative field. Lovetta Condie timeout was performed prior to the initiation of the procedure. Local anesthesia was provided with 1% lidocaine with epinephrine. Under direct ultrasound guidance, an 18 gauge core needle device was utilized to obtain to obtain 5 core needle biopsies of the dominant left inguinal lymph node. The samples were placed in saline  and submitted to pathology. The needle was removed and hemostasis was achieved with manual compression. Post procedure scan was negative for significant hematoma. Kiera Hussey dressing was placed. The patient tolerated the procedure well without immediate postprocedural complication. IMPRESSION: Technically successful ultrasound guided biopsy of dominant left inguinal lymph node. Electronically Signed   By: Sandi Mariscal M.D.   On: 02/28/2017 13:58        Scheduled Meds: . finasteride  5 mg Oral Daily  . fluticasone  1 spray Each Nare Daily  . metoprolol succinate  100 mg Oral Daily  . pantoprazole  40 mg Oral Daily  . sodium chloride flush  3 mL Intravenous Q12H  . tamsulosin  0.4 mg Oral Daily   Continuous Infusions: . sodium chloride    . azithromycin Stopped (02/28/17 2013)  . cefTRIAXone (ROCEPHIN)  IV Stopped (02/28/17 1910)  . lactated ringers       LOS: 1 day    Time spent: over 54 min    Fayrene Helper, MD [facility] Pager [phone]  If 7PM-7AM, please contact night-coverage [email or web address] Password [email or web address] 03/01/2017, 8:14 AM

## 2017-03-01 NOTE — Progress Notes (Signed)
Pharmacy Antibiotic Note  Jose Meza is a 84 y.o. male admitted on 02/27/2017 with pneumonia.  Pharmacy has been consulted for cefepime dosing.  Plan: Cefepime 1 gm IV q12  Height: 6\' 5"  (195.6 cm) Weight: 200 lb 2.8 oz (90.8 kg) IBW/kg (Calculated) : 89.1  Temp (24hrs), Avg:98.9 F (37.2 C), Min:97.5 F (36.4 C), Max:100.4 F (38 C)  Recent Labs  Lab 02/27/17 1514 02/27/17 1814 02/28/17 0423 03/01/17 0431  WBC 11.6*  --  10.0 11.9*  CREATININE 1.72*  --  1.81* 1.82*  LATICACIDVEN  --  1.29  --   --     Estimated Creatinine Clearance: 38.8 mL/min (A) (by C-G formula based on SCr of 1.82 mg/dL (H)).    Allergies  Allergen Reactions  . Sulfa Antibiotics Other (See Comments)    Paradoxical effect. Patient states Sulfa does not work for him.    Antimicrobials this admission: azith 2/13>> CTX 2/13>>2/14 Cefepime 2/15>> Dose adjustments this admission:  Microbiology results: 2/13 flu neg 2/14 strep pneumo neg 2/15 MRSA>> 2/14 resp panel neg 2/13 BCx2>>ngtd 2/13 Ucx> > 100 k GNR, > 100 K unidentified organism  Thank you for allowing pharmacy to be a part of this patient's care.  Eudelia Bunch, Pharm.D. [phone] 03/01/2017 12:06 PM

## 2017-03-01 NOTE — Care Management Note (Signed)
Case Management Note  Patient Details  Name: Jose Meza MRN: [id number] Date of Birth: 02/22/1931  Subjective/Objective: 84 y/o m admitted w/PNA. From home. Has rw. Actrive w/[facility]-rep Lisa aware & following for HHPT.                   Action/Plan:HHC   Expected Discharge Date:                  Expected Discharge Plan:  [place]  In-House Referral:     Discharge planning Services  CM Consult  Post Acute Care Choice:  Durable Medical Equipment, Home Health(Has rw;Active w/[facility]-HHPT) Choice offered to:     DME Arranged:    DME Agency:     HH Arranged:  PT HH Agency:  [facility] (formerly [employer])  Status of Service:  In process, will continue to follow  If discussed at Long Length of Stay Meetings, dates discussed:    Additional Comments:  Dessa Phi, RN 03/01/2017, 12:50 PM

## 2017-03-02 ENCOUNTER — Inpatient Hospital Stay (HOSPITAL_COMMUNITY): Payer: Medicare HMO

## 2017-03-02 DIAGNOSIS — I361 Nonrheumatic tricuspid (valve) insufficiency: Secondary | ICD-10-CM

## 2017-03-02 LAB — HEPATITIS C ANTIBODY

## 2017-03-02 LAB — BASIC METABOLIC PANEL
ANION GAP: 9 (ref 5–15)
BUN: 26 mg/dL — ABNORMAL HIGH (ref 6–20)
CHLORIDE: 107 mmol/L (ref 101–111)
CO2: 19 mmol/L — ABNORMAL LOW (ref 22–32)
CREATININE: 1.63 mg/dL — AB (ref 0.61–1.24)
Calcium: 8.4 mg/dL — ABNORMAL LOW (ref 8.9–10.3)
GFR calc non Af Amer: 37 mL/min — ABNORMAL LOW (ref >60)
GFR, EST AFRICAN AMERICAN: 43 mL/min — AB (ref >60)
Glucose, Bld: 99 mg/dL (ref 65–99)
Potassium: 3.9 mmol/L (ref 3.5–5.1)
SODIUM: 135 mmol/L (ref 135–145)

## 2017-03-02 LAB — CBC
HCT: 32.3 % — ABNORMAL LOW (ref 39.0–52.0)
HEMOGLOBIN: 10.8 g/dL — AB (ref 13.0–17.0)
MCH: 32.8 pg (ref 26.0–34.0)
MCHC: 33.4 g/dL (ref 30.0–36.0)
MCV: 98.2 fL (ref 78.0–100.0)
Platelets: 208 10*3/uL (ref 150–400)
RBC: 3.29 MIL/uL — AB (ref 4.22–5.81)
RDW: 16.6 % — ABNORMAL HIGH (ref 11.5–15.5)
WBC: 11.1 10*3/uL — AB (ref 4.0–10.5)

## 2017-03-02 LAB — URINE CULTURE

## 2017-03-02 LAB — ECHOCARDIOGRAM COMPLETE
HEIGHTINCHES: 77 in
WEIGHTICAEL: 3262.81 [oz_av]

## 2017-03-02 LAB — MAGNESIUM: MAGNESIUM: 2.2 mg/dL (ref 1.7–2.4)

## 2017-03-02 LAB — PROCALCITONIN: PROCALCITONIN: 1.02 ng/mL

## 2017-03-02 LAB — HEPATITIS B SURFACE ANTIGEN: Hepatitis B Surface Ag: NEGATIVE

## 2017-03-02 MED ORDER — FUROSEMIDE 10 MG/ML IJ SOLN
40.0000 mg | Freq: Once | INTRAMUSCULAR | Status: AC
  Administered 2017-03-02: 40 mg via INTRAVENOUS
  Filled 2017-03-02: qty 4

## 2017-03-02 MED ORDER — METOPROLOL TARTRATE 50 MG PO TABS
75.0000 mg | ORAL_TABLET | Freq: Two times a day (BID) | ORAL | Status: DC
  Administered 2017-03-02 – 2017-03-12 (×17): 75 mg via ORAL
  Administered 2017-03-13: 50 mg via ORAL
  Administered 2017-03-13 – 2017-03-14 (×2): 75 mg via ORAL
  Filled 2017-03-02 (×23): qty 1

## 2017-03-02 MED ORDER — BENZONATATE 100 MG PO CAPS
200.0000 mg | ORAL_CAPSULE | Freq: Two times a day (BID) | ORAL | Status: DC | PRN
  Filled 2017-03-02: qty 2

## 2017-03-02 MED ORDER — HYDROXYZINE HCL 25 MG PO TABS
25.0000 mg | ORAL_TABLET | Freq: Three times a day (TID) | ORAL | Status: DC | PRN
  Administered 2017-03-02 – 2017-03-03 (×4): 25 mg via ORAL
  Filled 2017-03-02 (×4): qty 1

## 2017-03-02 NOTE — Progress Notes (Signed)
Pt. had some pain and small amount of bright red blood in urine. Pain has decreased since lasix given. Blood noted on pad after urination. MD notified.

## 2017-03-02 NOTE — Progress Notes (Addendum)
PROGRESS NOTE    Jose Meza  OEV:[id number] DOB: 1931/02/06 DOA: 02/27/2017 PCP: Jose Manes, MD   Brief Narrative:  Jose Meza is Jose Meza 84 y.o. male with medical history significant of chronic kidney disease, rheumatoid arthritis chronically on steroids, hypertension transferred here from [facility] after finding use disease in his chest concerning for lymphoma.  Patient arrived there with complaints of coughing for over Bearett Porcaro week with chills and low-grade fevers at home.  Is lost over 20 pounds in the last 4-6 weeks.  He is recently been treated for prostatitis with an unknown antibiotic Jose Meza month ago.  His symptoms have persisted however despite recent steroid taper for bronchitis also.  He denies any nausea vomiting or diarrhea.  He has not noticed any swollen areas on his body lately.  He denies any rashes.  CTA was done which showed diffuse disease concerning for lymphoma with possible pneumonia.  Patient is referred for admission for hypoxia with 90% O2 sats on room air along with possible pneumonia.  He denies any pain.  Assessment & Plan:   Principal Problem:   PNA (pneumonia) Active Problems:   Hyperlipidemia   Hypertension   CKD (chronic kidney disease) stage 3, GFR 30-59 ml/min (HCC)   Rheumatoid arthritis of multiple sites with negative rheumatoid factor (HCC)   Pneumonia   Generalized lymphadenopathy  Acute Hypoxic Respiratory Failure:  Currently being treated for community acquired pneumonia, but patient also with widepread lymphadenopathy and pulmonary nodules and interlobular septal thickening throughout lungs which could be contributing.  Immunocompromised with treatment for RA on xeljanz, recently treated with steroids as well for past few days.  Of note, neg quant gold 10/2016 and denies risk factors for exposure.   Increased SOB this morning.  Suspect he may be starting to get Lamees Gable bit volume overloaded with dependent edema noted to bilateral arms and crackles.    Follow CXR lasix.  Hold IVF.     Ceftriaxone (2/13-2/15) and azithromycin (2/13 - )  Continue cefepime (2/15 - )  MRSA PCR pending  Sputum cx pending, urine strep negative, urine legionella pending Negative influenza RVP negative PCT improving Will consider adding on prednisone as well for pneumonia  Diffuse Lymphadenopathy  Weight Loss  Fevers: imaging findings concerning for lymphoma per radiology.  He notes weight loss and low grade temps over past several months.  Denies night sweats.  Also on differential based on imaging findings is metastatic primary bronchogenic carcinoma.    S/p biopsy of inguinal LN by IR Surgery planning to see Jose Meza tomorrow, planning for excisional bx given unrevealing core bx at this point.  Will need to be sent for flow.  Nutrition  Upper Extremity Swelling:  Follow dopplers, suspect 2/2 volume overload.   UTI: UA nitrite positive, bacteria, and 6-30 WBC, though with positive squams.  Follow cx.  Continue ceftriaxone. Hx of prostatitis.  Ucx with klebsiella sensitive to cephalosporins.  Follow.   Tachycardia: sinus.  EKG appeared similar to priors. Place on telemetry.  Improved yesterday after metop and IVF and similarly has improved this morning as well. Continue metop BID instead of 24 hour dosing.   TSH 6.45, would repeat outside of acute illness  Stop IVF given swelling above Echo with EF 82-99%, grade 1 diastolic dysfunction (see report) Increase beta blocker as needed  Anemia: slightly downtrended, likely 2/2 IVF.  Continue to monitor. Stable.   Leukocytosis: 2/2 above, continue to monitor. stable  Rheumatoid Arthritis:  Hold [name]  CKD Stage III: baseline creatinine looks to be between 1.5-1.7. Improved today to 1.63.  HTN: metoprolol  GERD: PPI  Anxiety: ativan bid prn, remeron added for insomnia and potentially to help with appetite as well.  Atarax added for anxiety.   Ectatic Infrarenal Abdominal Aorta: follow up as  noted per rads  DVT prophylaxis: SCDs Code Status: full code Family Communication: discussed with daughter 2/14 Disposition Plan: pending improvement in resp status and bx results   Consultants:   IR  Procedures:   US Guided Biopsy of L inguinal LN 02/28/17  Antimicrobials:  Anti-infectives (From admission, onward)   Start     Dose/Rate Route Frequency Ordered Stop   03/01/17 1300  ceFEPIme (MAXIPIME) 1 g in sodium chloride 0.9 % 100 mL IVPB     1 g 200 mL/hr over 30 Minutes Intravenous 2 times daily 03/01/17 1202     02/28/17 1800  cefTRIAXone (ROCEPHIN) 1 g in sodium chloride 0.9 % 100 mL IVPB  Status:  Discontinued     1 g 200 mL/hr over 30 Minutes Intravenous Every 24 hours 02/27/17 2205 03/01/17 1151   02/28/17 1800  azithromycin (ZITHROMAX) 500 mg in sodium chloride 0.9 % 250 mL IVPB     500 mg 250 mL/hr over 60 Minutes Intravenous Every 24 hours 02/27/17 2205 03/07/17 1759   02/27/17 1824  azithromycin (ZITHROMAX) 500 MG injection    Comments:  Meza, Jose   : cabinet override      02/27/17 1824 02/28/17 0629   02/27/17 1800  cefTRIAXone (ROCEPHIN) 1 g in sodium chloride 0.9 % 100 mL IVPB     1 g 200 mL/hr over 30 Minutes Intravenous  Once 02/27/17 1757 02/27/17 1905   02/27/17 1800  azithromycin (ZITHROMAX) 500 mg in sodium chloride 0.9 % 250 mL IVPB     500 mg 250 mL/hr over 60 Minutes Intravenous  Once 02/27/17 1757 02/27/17 2034     Subjective: Worsening SOB this morning. Feels better sitting up. Swelling to bilateral arms as well.  Objective: Vitals:   03/01/17 2032 03/02/17 0005 03/02/17 0422 03/02/17 0932  BP: 129/64  125/72 129/79  Pulse: (!) 105  (!) 113 (!) 110  Resp: 18  12   Temp: 97.9 F (36.6 C)  98 F (36.7 C)   TempSrc: Oral  Oral   SpO2: 96% 97% 96%   Weight:   92.5 kg (203 lb 14.8 oz)   Height:        Intake/Output Summary (Last 24 hours) at 03/02/2017 1400 Last data filed at 03/02/2017 0400 Gross per 24 hour  Intake 3362.5 ml   Output -  Net 3362.5 ml   Filed Weights   02/27/17 2210 03/01/17 1550 03/02/17 0422  Weight: 90.8 kg (200 lb 2.8 oz) 93.7 kg (206 lb 9.1 oz) 92.5 kg (203 lb 14.8 oz)    Examination:  General: No acute distress. Cardiovascular: Heart sounds show Klint Lezcano regular rate, and rhythm. No gallops or rubs. No murmurs. Lungs: Crackles to bases, slightly labored Abdomen: Soft, nontender, nondistended with normal active bowel sounds. No masses. No hepatosplenomegaly. Neurological: Alert and oriented 3. Moves all extremities 4 with equal strength. Cranial nerves II through XII grossly intact. Skin: Warm and dry. No rashes or lesions. Extremities: No clubbing or cyanosis. Edema to bilateral arms.  Psychiatric: Mood and affect are normal. Insight and judgment are appropriate.   Data Reviewed: I have personally reviewed following labs and imaging studies  CBC: Recent Labs  Lab 02/27/17 1514  02/28/17 0423 03/01/17 0431 03/02/17 0338  WBC 11.6* 10.0 11.9* 11.1*  NEUTROABS 9.0* 7.8*  --   --   HGB 12.2* 10.9* 11.4* 10.8*  HCT 35.3* 32.1* 33.7* 32.3*  MCV 96.7 98.2 98.8 98.2  PLT 207 217 202 196   Basic Metabolic Panel: Recent Labs  Lab 02/27/17 1514 02/28/17 0423 03/01/17 0431 03/02/17 0338  NA 129* 134* 135 135  K 4.2 4.3 4.2 3.9  CL 99* 103 106 107  CO2 19* 21* 19* 19*  GLUCOSE 130* 105* 107* 99  BUN 30* 29* 29* 26*  CREATININE 1.72* 1.81* 1.82* 1.63*  CALCIUM 9.0 8.6* 8.1* 8.4*  MG  --   --  2.1 2.2   GFR: Estimated Creatinine Clearance: 43.3 mL/min (Jasher Barkan) (by C-G formula based on SCr of 1.63 mg/dL (H)). Liver Function Tests: Recent Labs  Lab 02/27/17 1514  AST 29  ALT 25  ALKPHOS 59  BILITOT 0.4  PROT 7.3  ALBUMIN 2.7*   Recent Labs  Lab 02/27/17 1514  LIPASE 46   No results for input(s): AMMONIA in the last 168 hours. Coagulation Profile: Recent Labs  Lab 02/28/17 0423  INR 1.28   Cardiac Enzymes: Recent Labs  Lab 02/27/17 1514  TROPONINI <0.03   BNP  (last 3 results) No results for input(s): PROBNP in the last 8760 hours. HbA1C: No results for input(s): HGBA1C in the last 72 hours. CBG: No results for input(s): GLUCAP in the last 168 hours. Lipid Profile: No results for input(s): CHOL, HDL, LDLCALC, TRIG, CHOLHDL, LDLDIRECT in the last 72 hours. Thyroid Function Tests: Recent Labs    02/27/17 1940  TSH 6.452*   Anemia Panel: No results for input(s): VITAMINB12, FOLATE, FERRITIN, TIBC, IRON, RETICCTPCT in the last 72 hours. Sepsis Labs: Recent Labs  Lab 02/27/17 1814 02/28/17 0423 03/01/17 0431 03/02/17 0338  PROCALCITON  --  0.38 1.27 1.02  LATICACIDVEN 1.29  --   --   --     Recent Results (from the past 240 hour(s))  Blood culture (routine x 2)     Status: None (Preliminary result)   Collection Time: 02/27/17  6:00 PM  Result Value Ref Range Status   Specimen Description   Final    BLOOD BLOOD RIGHT HAND Performed at [facility], [address] [place], [place] [address]    Special Requests   Final    BOTTLES DRAWN AEROBIC AND ANAEROBIC Blood Culture adequate volume Performed at [facility], [address] [place], [place] [address]    Culture   Final    NO GROWTH 3 DAYS Performed at [facility], [place] [address], [place], [place] [address]    Report Status PENDING  Incomplete  Blood culture (routine x 2)     Status: None (Preliminary result)   Collection Time: 02/27/17  6:15 PM  Result Value Ref Range Status   Specimen Description   Final    BLOOD LEFT ANTECUBITAL Performed at [facility], [address] [place], [place] [address]    Special Requests   Final    BOTTLES DRAWN AEROBIC AND ANAEROBIC Blood Culture adequate volume Performed at [facility], [address] [place], [place] [address]    Culture   Final    NO GROWTH 3 DAYS Performed at [facility], [place] [address] [place], [place] [address]    Report Status PENDING  Incomplete   Urine culture  Status: Abnormal   Collection Time: 02/27/17  7:40 PM  Result Value Ref Range Status   Specimen Description   Final    URINE, RANDOM Performed at [facility], [address] [place], [place] [address]    Special Requests   Final    NONE Performed at [facility], [address] [place], [place] [address]    Culture >=100,000 COLONIES/mL KLEBSIELLA PNEUMONIAE (Taevyn Hausen)  Final   Report Status 03/02/2017 FINAL  Final   Organism ID, Bacteria KLEBSIELLA PNEUMONIAE (Jesse Nosbisch)  Final      Susceptibility   Klebsiella pneumoniae - MIC*    AMPICILLIN >=32 RESISTANT Resistant     CEFAZOLIN <=4 SENSITIVE Sensitive     CEFTRIAXONE <=1 SENSITIVE Sensitive     CIPROFLOXACIN <=0.25 SENSITIVE Sensitive     GENTAMICIN <=1 SENSITIVE Sensitive     IMIPENEM <=0.25 SENSITIVE Sensitive     NITROFURANTOIN 64 INTERMEDIATE Intermediate     TRIMETH/SULFA <=20 SENSITIVE Sensitive     AMPICILLIN/SULBACTAM 8 SENSITIVE Sensitive     PIP/TAZO <=4 SENSITIVE Sensitive     Extended ESBL NEGATIVE Sensitive     * >=100,000 COLONIES/mL KLEBSIELLA PNEUMONIAE  Respiratory Panel by PCR     Status: None   Collection Time: 02/28/17  4:14 PM  Result Value Ref Range Status   Adenovirus NOT DETECTED NOT DETECTED Final   Coronavirus 229E NOT DETECTED NOT DETECTED Final   Coronavirus HKU1 NOT DETECTED NOT DETECTED Final   Coronavirus NL63 NOT DETECTED NOT DETECTED Final   Coronavirus OC43 NOT DETECTED NOT DETECTED Final   Metapneumovirus NOT DETECTED NOT DETECTED Final   Rhinovirus / Enterovirus NOT DETECTED NOT DETECTED Final   Influenza Sible Straley NOT DETECTED NOT DETECTED Final   Influenza B NOT DETECTED NOT DETECTED Final   Parainfluenza Virus 1 NOT DETECTED NOT DETECTED Final   Parainfluenza Virus 2 NOT DETECTED NOT DETECTED Final   Parainfluenza Virus 3 NOT DETECTED NOT DETECTED Final   Parainfluenza Virus 4 NOT DETECTED NOT DETECTED Final   Respiratory Syncytial Virus NOT DETECTED NOT  DETECTED Final   Bordetella pertussis NOT DETECTED NOT DETECTED Final   Chlamydophila pneumoniae NOT DETECTED NOT DETECTED Final   Mycoplasma pneumoniae NOT DETECTED NOT DETECTED Final    Comment: Performed at [facility], [place]. [address] [place], [place] [address]  MRSA PCR Screening     Status: None   Collection Time: 03/01/17  9:40 AM  Result Value Ref Range Status   MRSA by PCR NEGATIVE NEGATIVE Final    Comment:        The GeneXpert MRSA Assay (FDA approved for NASAL specimens only), is one component of Adhvik Canady comprehensive MRSA colonization surveillance program. It is not intended to diagnose MRSA infection nor to guide or monitor treatment for MRSA infections. Performed at [facility], [place] [address], [place], [place] [address]          Radiology Studies: Dg Chest 2 View  Result Date: 03/02/2017 CLINICAL DATA:  Hypoxia. EXAM: CHEST  2 VIEW COMPARISON:  Chest radiograph 02/27/2017 and chest CT 02/27/2017 FINDINGS: Increased densities at the left lung base particularly in the retrocardiac region. Increased interstitial densities at the right lung base. Cardiac silhouette is prominent but similar to the previous examination and accentuated by the AP technique. Evidence for pleural effusions on the lateral view. IMPRESSION: Increased bibasilar chest densities, particularly on the left side. Findings are suggestive for Shakya Sebring combination of pleural fluid and  consolidation/volume loss. Electronically Signed   By: Markus Daft M.D.   On: 03/02/2017 13:53        Scheduled Meds: . feeding supplement  1 Container Oral BID BM  . feeding supplement (ENSURE ENLIVE)  237 mL Oral Q24H  . finasteride  5 mg Oral Daily  . fluticasone  1 spray Each Nare Daily  . metoprolol tartrate  50 mg Oral BID  . mirtazapine  7.5 mg Oral QHS  . multivitamin with minerals  1 tablet Oral Daily  . pantoprazole  40 mg Oral Daily  . sodium chloride flush  3 mL Intravenous Q12H  .  tamsulosin  0.4 mg Oral Daily   Continuous Infusions: . sodium chloride    . azithromycin Stopped (03/01/17 2031)  . ceFEPime (MAXIPIME) IV Stopped (03/02/17 1002)     LOS: 2 days    Time spent: over 30 min    Fayrene Helper, MD [facility] Pager [phone]  If 7PM-7AM, please contact night-coverage [email or web address] Password [email or web address] 03/02/2017, 2:00 PM

## 2017-03-02 NOTE — Evaluation (Signed)
Clinical/Bedside Swallow Evaluation Patient Details  Name: Jose Meza MRN: [id number] Date of Birth: 1931/04/15  Today's Date: 03/02/2017 Time: SLP Start Time (ACUTE ONLY): 21 SLP Stop Time (ACUTE ONLY): 1542 SLP Time Calculation (min) (ACUTE ONLY): 18 min  Past Medical History:  Past Medical History:  Diagnosis Date  . Allergic rhinitis, seasonal   . Arthritis    neg rheum eval  . BPH (benign prostatic hypertrophy)    recurrent prostatitis, hx BOO  . Cauda equina, syndrome   . Chronic kidney disease    stage 2-3  . DJD (degenerative joint disease) of knee    right knee, s/p knee replacement 12/06  . GERD (gastroesophageal reflux disease)   . Hyperlipidemia   . Hypertension   . IBS (irritable bowel syndrome)    constipation prone  . Incomplete RBBB    ECG 05/08/13  . Polyp of colon, adenomatous 07/2009   buccini  . Rheumatoid arthritis [facility])    Past Surgical History:  Past Surgical History:  Procedure Laterality Date  . JOINT REPLACEMENT  12/2004   R TKR  . KNEE ARTHROPLASTY    . Right knee replacement  2007  . [place]  . Ulner Tranpos  2009   HPI:  84 y.o. male with medical history significant of chronic kidney disease, rheumatoid arthritis chronically on steroids, hypertension transferred here from [facility] after finding use disease in his chest concerning for lymphoma.  Patient arrived there with complaints of coughing for over a week with chills and low-grade fevers at home.  Is lost over 20 pounds in the last 4-6 weeks.  He is recently been treated for prostatitis with an unknown antibiotic a month ago.  His symptoms have persisted however despite recent steroid taper for bronchitis also.  He denies any nausea vomiting or diarrhea.  He has not noticed any swollen areas on his body lately.  He denies any rashes.  CTA was done which showed diffuse disease concerning for lymphoma with possible pneumonia.  Patient is referred for admission for  hypoxia with 90% O2 sats on room air along with possible pneumonia.  He denies any pain. CXR on 03/02/17 indicated increased bibasilar chest densities particularly on left side; findings are suggestive for a combination of pleural fluid and consolidation volume loss.  Assessment / Plan / Recommendation Clinical Impression   Pt with pharyngeal dysphagia characterized by overt s/s of aspiration without use of chin tuck with sips of thin liquids including delayed cough, hoarse vocal quality and multiple swallows; chin tuck eliminated these symptoms; swallowing/breathing reciprocity deficits noted d/t dyspnea without exertion paired with anxiety regarding upcoming testing; pt also exhibits decreased satiety overall per pt report and is consuming primarily protein shakes for PO intake at current time.  ST will f/u while in acute setting for diet tolerance/swallowing safety education and potential upgrade of current diet.  SLP Visit Diagnosis: Dysphagia, pharyngeal phase (R13.13)    Aspiration Risk  Mild aspiration risk    Diet Recommendation   Full Liquids  Medication Administration: Whole meds with puree    Other  Recommendations Oral Care Recommendations: Oral care BID   Follow up Recommendations Other (comment)(TBD)      Frequency and Duration min 2x/week  1 week       Prognosis Prognosis for Safe Diet Advancement: Good      Swallow Study   General Date of Onset: 02/27/17 HPI: 84 y.o. male with medical history significant of chronic kidney disease, rheumatoid  arthritis chronically on steroids, hypertension transferred here from [facility] after finding use disease in his chest concerning for lymphoma.  Patient arrived there with complaints of coughing for over a week with chills and low-grade fevers at home.  Is lost over 20 pounds in the last 4-6 weeks.  He is recently been treated for prostatitis with an unknown antibiotic a month ago.  His symptoms have persisted however  despite recent steroid taper for bronchitis also.  He denies any nausea vomiting or diarrhea.  He has not noticed any swollen areas on his body lately.  He denies any rashes.  CTA was done which showed diffuse disease concerning for lymphoma with possible pneumonia.  Patient is referred for admission for hypoxia with 90% O2 sats on room air along with possible pneumonia.  He denies any pain. Type of Study: Bedside Swallow Evaluation Previous Swallow Assessment: n/a Diet Prior to this Study: Regular;Thin liquids Temperature Spikes Noted: No Respiratory Status: Nasal cannula History of Recent Intubation: No Behavior/Cognition: Alert;Cooperative;Other (Comment)(anxious) Oral Cavity Assessment: Dry Oral Care Completed by SLP: No Oral Cavity - Dentition: Adequate natural dentition Vision: Functional for self-feeding Self-Feeding Abilities: Able to feed self Patient Positioning: Upright in bed Baseline Vocal Quality: Hoarse Volitional Cough: Strong Volitional Swallow: Able to elicit    Oral/Motor/Sensory Function Overall Oral Motor/Sensory Function: Generalized oral weakness   Ice Chips Ice chips: Within functional limits Presentation: Spoon   Thin Liquid Thin Liquid: Impaired Presentation: Cup;Spoon Pharyngeal  Phase Impairments: Multiple swallows;Cough - Delayed    Nectar Thick Nectar Thick Liquid: Not tested   Honey Thick Honey Thick Liquid: Not tested   Puree Puree: Within functional limits Presentation: Spoon   Solid      Solid: Not tested(Pt refused; decreased satiety)        Elvina Sidle, M.S., CCC-SLP 03/02/2017,4:37 PM

## 2017-03-02 NOTE — Progress Notes (Signed)
  Echocardiogram 2D Echocardiogram has been performed.  Jose Meza T Jose Meza 03/02/2017, 8:37 AM

## 2017-03-03 ENCOUNTER — Inpatient Hospital Stay (HOSPITAL_COMMUNITY): Payer: Medicare HMO

## 2017-03-03 DIAGNOSIS — R609 Edema, unspecified: Secondary | ICD-10-CM

## 2017-03-03 LAB — CBC
HCT: 34.6 % — ABNORMAL LOW (ref 39.0–52.0)
Hemoglobin: 11.7 g/dL — ABNORMAL LOW (ref 13.0–17.0)
MCH: 33.2 pg (ref 26.0–34.0)
MCHC: 33.8 g/dL (ref 30.0–36.0)
MCV: 98.3 fL (ref 78.0–100.0)
PLATELETS: 191 10*3/uL (ref 150–400)
RBC: 3.52 MIL/uL — AB (ref 4.22–5.81)
RDW: 16.7 % — AB (ref 11.5–15.5)
WBC: 13.1 10*3/uL — AB (ref 4.0–10.5)

## 2017-03-03 LAB — BRAIN NATRIURETIC PEPTIDE: B Natriuretic Peptide: 95.5 pg/mL (ref 0.0–100.0)

## 2017-03-03 LAB — BASIC METABOLIC PANEL
Anion gap: 8 (ref 5–15)
BUN: 26 mg/dL — AB (ref 6–20)
CALCIUM: 8.7 mg/dL — AB (ref 8.9–10.3)
CO2: 21 mmol/L — ABNORMAL LOW (ref 22–32)
CREATININE: 1.72 mg/dL — AB (ref 0.61–1.24)
Chloride: 105 mmol/L (ref 101–111)
GFR calc Af Amer: 41 mL/min — ABNORMAL LOW (ref >60)
GFR, EST NON AFRICAN AMERICAN: 35 mL/min — AB (ref >60)
Glucose, Bld: 100 mg/dL — ABNORMAL HIGH (ref 65–99)
POTASSIUM: 4 mmol/L (ref 3.5–5.1)
SODIUM: 134 mmol/L — AB (ref 135–145)

## 2017-03-03 LAB — PROCALCITONIN: Procalcitonin: 0.79 ng/mL

## 2017-03-03 LAB — MAGNESIUM: MAGNESIUM: 2.1 mg/dL (ref 1.7–2.4)

## 2017-03-03 MED ORDER — FUROSEMIDE 10 MG/ML IJ SOLN: 40.0000 mg | Freq: Two times a day (BID) | INTRAMUSCULAR | Status: DC

## 2017-03-03 MED ORDER — IPRATROPIUM-ALBUTEROL 0.5-2.5 (3) MG/3ML IN SOLN
3.0000 mL | Freq: Three times a day (TID) | RESPIRATORY_TRACT | Status: DC
  Administered 2017-03-03: 3 mL via RESPIRATORY_TRACT
  Filled 2017-03-03 (×3): qty 3

## 2017-03-03 MED ORDER — HEPARIN SODIUM (PORCINE) 5000 UNIT/ML IJ SOLN
5000.0000 [IU] | Freq: Three times a day (TID) | INTRAMUSCULAR | Status: DC
  Administered 2017-03-03 – 2017-03-14 (×27): 5000 [IU] via SUBCUTANEOUS
  Filled 2017-03-03 (×26): qty 1

## 2017-03-03 MED ORDER — ALBUTEROL SULFATE (2.5 MG/3ML) 0.083% IN NEBU
2.5000 mg | INHALATION_SOLUTION | RESPIRATORY_TRACT | Status: DC | PRN
  Administered 2017-03-05: 2.5 mg via RESPIRATORY_TRACT
  Filled 2017-03-03: qty 3

## 2017-03-03 MED ORDER — HYDROXYZINE HCL 25 MG PO TABS
25.0000 mg | ORAL_TABLET | Freq: Three times a day (TID) | ORAL | Status: DC | PRN
Start: 2017-03-03 — End: 2017-03-03

## 2017-03-03 MED ORDER — LORAZEPAM 1 MG PO TABS
1.0000 mg | ORAL_TABLET | Freq: Two times a day (BID) | ORAL | Status: DC | PRN
  Administered 2017-03-03 – 2017-03-07 (×4): 1 mg via ORAL
  Filled 2017-03-03 (×4): qty 1

## 2017-03-03 MED ORDER — FUROSEMIDE 10 MG/ML IJ SOLN
40.0000 mg | Freq: Two times a day (BID) | INTRAMUSCULAR | Status: DC
  Administered 2017-03-03: 40 mg via INTRAVENOUS
  Filled 2017-03-03: qty 4

## 2017-03-03 NOTE — Progress Notes (Signed)
UE venous duplex prelim: negative for DVT. Superficial thrombosis noted in bilateral cephalic veins. Landry Mellow, RDMS, RVT

## 2017-03-03 NOTE — Progress Notes (Signed)
Subjective No acute events. Feeling better. Sats have been in the mid 90s on 2L. Cough has improved.  Objective: Vital signs in last 24 hours: Temp:  [97.9 F (36.6 C)-98.8 F (37.1 C)] 98.8 F (37.1 C) (02/17 0411) Pulse Rate:  [91-110] 91 (02/17 0411) Resp:  [16-20] 16 (02/17 0411) BP: (105-132)/(61-79) 132/67 (02/17 0411) SpO2:  [93 %-100 %] 94 % (02/17 0411) Weight:  [96 kg (211 lb 10.3 oz)] 96 kg (211 lb 10.3 oz) (02/17 0411) Last BM Date: 03/01/17  Intake/Output from previous day: 02/16 0701 - 02/17 0700 In: -  Out: 550 [Urine:550] Intake/Output this shift: No intake/output data recorded.  Gen: NAD, comfortable CV: RRR Pulm: Normal work of breathing Abd: Soft, NT/ND; L groin with bandaid from IR bx. Palpable lymphadenopathy. Axilla: No significant palpable lymphadenopathy Ext: SCDs in place  Lab Results: CBC  Recent Labs    03/02/17 0338 03/03/17 0353  WBC 11.1* 13.1*  HGB 10.8* 11.7*  HCT 32.3* 34.6*  PLT 208 191   BMET Recent Labs    03/02/17 0338 03/03/17 0353  NA 135 134*  K 3.9 4.0  CL 107 105  CO2 19* 21*  GLUCOSE 99 100*  BUN 26* 26*  CREATININE 1.63* 1.72*  CALCIUM 8.4* 8.7*    Anti-infectives: Anti-infectives (From admission, onward)   Start     Dose/Rate Route Frequency Ordered Stop   03/01/17 1300  ceFEPIme (MAXIPIME) 1 g in sodium chloride 0.9 % 100 mL IVPB     1 g 200 mL/hr over 30 Minutes Intravenous 2 times daily 03/01/17 1202 03/11/17 0959   02/28/17 1800  cefTRIAXone (ROCEPHIN) 1 g in sodium chloride 0.9 % 100 mL IVPB  Status:  Discontinued     1 g 200 mL/hr over 30 Minutes Intravenous Every 24 hours 02/27/17 2205 03/01/17 1151   02/28/17 1800  azithromycin (ZITHROMAX) 500 mg in sodium chloride 0.9 % 250 mL IVPB     500 mg 250 mL/hr over 60 Minutes Intravenous Every 24 hours 02/27/17 2205 03/07/17 1759   02/27/17 1824  azithromycin (ZITHROMAX) 500 MG injection    Comments:  Peel, Adrienne   : cabinet override      02/27/17  1824 02/28/17 0629   02/27/17 1800  cefTRIAXone (ROCEPHIN) 1 g in sodium chloride 0.9 % 100 mL IVPB     1 g 200 mL/hr over 30 Minutes Intravenous  Once 02/27/17 1757 02/27/17 1905   02/27/17 1800  azithromycin (ZITHROMAX) 500 mg in sodium chloride 0.9 % 250 mL IVPB     500 mg 250 mL/hr over 60 Minutes Intravenous  Once 02/27/17 1757 02/27/17 2034       Assessment/Plan: Patient Active Problem List   Diagnosis Date Noted  . Generalized lymphadenopathy   . Pneumonia 02/28/2017  . PNA (pneumonia) 02/27/2017  . Rheumatoid arthritis of multiple sites with negative rheumatoid factor ([place]) 01/30/2016  . High risk medication use 01/30/2016  . History of total knee replacement, right 01/30/2016  . Primary osteoarthritis of both hands 01/30/2016  . Primary osteoarthritis of both feet 01/30/2016  . DDD C-spine 01/30/2016  . Osteoarthritis of lumbar spine 01/30/2016  . History of IBS 01/30/2016  . Hyperlipidemia   . DJD (degenerative joint disease) of knee   . Allergic rhinitis, seasonal   . BPH (benign prostatic hyperplasia)   . Hypertension   . Arthritis   . GERD (gastroesophageal reflux disease)   . CKD (chronic kidney disease) stage 3, GFR 30-59 ml/min (HCC)   .  Polyp of colon, adenomatous 07/15/2009   Multiple enlarged lymph nodes in the groins - particularly the left groin. Nothing too convincing in his axilla. Would plan left groin lymph node excision for biopsy. We are available tomorrow should he be cleared for his procedure by his primary team. NPO after midnight tonight. -The anatomy and physiology of the groin was discussed with the patient and his daughter.  Pathophysiology of his condition was elaborated and the need for additional tissue was discussed.  We have been requested to obtain excisional biopsy of a lymph node for further pathologic assessment of his condition.  The planned procedure, material risks (including, but not limited to, pain, bleeding, infection, need for  additional tissue and/or procedures, hematoma, seroma, nondiagnostic study, worsening of his pulmonary condition including the need for intubation/prolonged intubation) benefits and alternatives were discussed.  He and his daughter's questions were answered to their satisfaction and they elected to proceed with the procedure   LOS: 3 days   Sharon Mt. Dema Severin, M.D. General and Colorectal Surgery [facility]

## 2017-03-03 NOTE — Progress Notes (Signed)
Pt de sating 79% O2. O2 @ 3L via [place] started. O2 96%.

## 2017-03-03 NOTE — Progress Notes (Addendum)
PROGRESS NOTE    Jose Meza  YSA:[id number] DOB: 1931-10-21 DOA: 02/27/2017 PCP: Lajean Manes, MD   Brief Narrative:  Jose Meza is a 84 y.o. male with medical history significant of chronic kidney disease, rheumatoid arthritis chronically on steroids, hypertension transferred here from [facility] after finding use disease in his chest concerning for lymphoma.  Patient arrived there with complaints of coughing for over a week with chills and low-grade fevers at home.  Is lost over 20 pounds in the last 4-6 weeks.  He is recently been treated for prostatitis with an unknown antibiotic a month ago.  His symptoms have persisted however despite recent steroid taper for bronchitis also.  He denies any nausea vomiting or diarrhea.  He has not noticed any swollen areas on his body lately.  He denies any rashes.  CTA was done which showed diffuse disease concerning for lymphoma with possible pneumonia.  Patient is referred for admission for hypoxia with 90% O2 sats on room air along with possible pneumonia.  He denies any pain.  Assessment & Plan:   Principal Problem:   PNA (pneumonia) Active Problems:   Hyperlipidemia   Hypertension   CKD (chronic kidney disease) stage 3, GFR 30-59 ml/min (HCC)   Rheumatoid arthritis of multiple sites with negative rheumatoid factor (HCC)   Pneumonia   Generalized lymphadenopathy  Acute Hypoxic Respiratory Failure:  Currently being treated for community acquired pneumonia, but patient also with widepread lymphadenopathy and pulmonary nodules and interlobular septal thickening throughout lungs which could be contributing.  Immunocompromised with treatment for RA on xeljanz, recently treated with steroids as well for past few days.  Of note, neg quant gold 10/2016 and denies risk factors for exposure.  CXR 2/17 with "mild interval worsening bilateral hazy perihilar opacification which may be due to worsening interstitial edema as infection is also  possible.  Known bilateral hilar adenopathy.  Small pleural effusions unchanged." Lasix 40 BID for possible overload (follow BNP) Duonebs, albuterol prn Hold IVF.     Ceftriaxone (2/13-2/15) and azithromycin (2/13 - )  Continue cefepime (2/15 - )  MRSA PCR negative Sputum cx pending, urine strep negative, urine legionella pending Negative influenza RVP negative PCT improving Will consider adding on prednisone as well for pneumonia, but holding for now with bx SLP eval - decreased to full liquid diet. SLP following.  In's outs, daily weights  Filed Weights   03/01/17 1550 03/02/17 0422 03/03/17 0411  Weight: 93.7 kg (206 lb 9.1 oz) 92.5 kg (203 lb 14.8 oz) 96 kg (211 lb 10.3 oz)     Leukocytosis: slightly worsening, 2/2 above. Follow.  PCT downtrending.   Diffuse Lymphadenopathy  Weight Loss  Fevers: imaging findings concerning for lymphoma per radiology.  He notes weight loss and low grade temps over past several months.  Denies night sweats.  Also on differential based on imaging findings is metastatic primary bronchogenic carcinoma. S/p biopsy of inguinal LN by IR Surgery planning to see Mr. Zulauf tomorrow, planning for excisional bx given unrevealing core bx at this point.  Will need to be sent for flow.   Nutrition  Upper Extremity Swelling:  Follow dopplers, suspect 2/2 volume overload.  [place] with superficial thrombosis in bilateral cephalic veins, but negative for DVT.  RUE swelling seems a bit better today, still some on L side.   UTI  Hematuria: UA nitrite positive, bacteria, and 6-30 WBC, though with positive squams.  Follow cx.  Continue ceftriaxone. Hx of prostatitis.  Ucx with klebsiella sensitive to cephalosporins.  Follow. Needs outpatient f/u for hematuria   Tachycardia: sinus.  EKG appeared similar to priors. Place on telemetry.  Continue metop BID instead of 24 hour dosing.   Increased metop to 75 mg BID TSH 6.45, would repeat outside of acute illness  Stop IVF  given swelling above Echo with EF 94-76%, grade 1 diastolic dysfunction (see report) Increase beta blocker as needed  Anemia: slightly downtrended, likely 2/2 IVF.  Continue to monitor. Stable.   Leukocytosis: 2/2 above, continue to monitor. stable  Rheumatoid Arthritis:  Hold xeljanz  CKD Stage III: baseline creatinine looks to be between 1.5-1.7. 1.72 today.  HTN: metoprolol  GERD: PPI  Anxiety: ativan bid prn, will increase this to 1 mg BID prn.  Stop remeron and hydroxyzine as these aren't helping.  Asking for something additional for sleep as well, but discussed we'll make 1 change with resp problems above.  Ectatic Infrarenal Abdominal Aorta: follow up as noted per rads  DVT prophylaxis: heparin Code Status: full code Family Communication: discussed with daughter 2/17 Disposition Plan: pending improvement in resp status and bx results   Consultants:   IR  Procedures:   US Guided Biopsy of L inguinal LN 02/28/17  Antimicrobials:  Anti-infectives (From admission, onward)   Start     Dose/Rate Route Frequency Ordered Stop   03/01/17 1300  ceFEPIme (MAXIPIME) 1 g in sodium chloride 0.9 % 100 mL IVPB     1 g 200 mL/hr over 30 Minutes Intravenous 2 times daily 03/01/17 1202 03/11/17 0959   02/28/17 1800  cefTRIAXone (ROCEPHIN) 1 g in sodium chloride 0.9 % 100 mL IVPB  Status:  Discontinued     1 g 200 mL/hr over 30 Minutes Intravenous Every 24 hours 02/27/17 2205 03/01/17 1151   02/28/17 1800  azithromycin (ZITHROMAX) 500 mg in sodium chloride 0.9 % 250 mL IVPB     500 mg 250 mL/hr over 60 Minutes Intravenous Every 24 hours 02/27/17 2205 03/07/17 1759   02/27/17 1824  azithromycin (ZITHROMAX) 500 MG injection    Comments:  Peel, Adrienne   : cabinet override      02/27/17 1824 02/28/17 0629   02/27/17 1800  cefTRIAXone (ROCEPHIN) 1 g in sodium chloride 0.9 % 100 mL IVPB     1 g 200 mL/hr over 30 Minutes Intravenous  Once 02/27/17 1757 02/27/17 1905   02/27/17 1800   azithromycin (ZITHROMAX) 500 mg in sodium chloride 0.9 % 250 mL IVPB     500 mg 250 mL/hr over 60 Minutes Intravenous  Once 02/27/17 1757 02/27/17 2034     Subjective: SOB. Fever, chills. Just a small amount of blood noticed yesterday with urination, resolved.   Objective: Vitals:   03/02/17 2227 03/03/17 0411 03/03/17 1205 03/03/17 1330  BP: 105/61 132/67 127/66 (!) 104/51  Pulse: (!) 107 91 94 (!) 108  Resp: 20 16  20   Temp: 98.7 F (37.1 C) 98.8 F (37.1 C)  99.1 F (37.3 C)  TempSrc: Axillary Axillary  Axillary  SpO2: 93% 94%  96%  Weight:  96 kg (211 lb 10.3 oz)    Height:        Intake/Output Summary (Last 24 hours) at 03/03/2017 1448 Last data filed at 03/03/2017 1203 Gross per 24 hour  Intake 363 ml  Output 550 ml  Net -187 ml   Filed Weights   03/01/17 1550 03/02/17 0422 03/03/17 0411  Weight: 93.7 kg (206 lb 9.1 oz) 92.5 kg (203  lb 14.8 oz) 96 kg (211 lb 10.3 oz)    Examination:  General: No acute distress. Cardiovascular: tachycardic Heart sounds show a regular rate, and rhythm. No gallops or rubs. No murmurs. No JVD. Lungs: Bilateral wheezing and crackles Abdomen: Soft, nontender, nondistended with normal active bowel sounds. No masses. No hepatosplenomegaly. Neurological: Alert and oriented 3. Moves all extremities 4. Cranial nerves II through XII grossly intact. Skin: Warm and dry. No rashes or lesions. Extremities: L>R UE edema Psychiatric: Mood and affect are normal. Insight and judgment are appropriate.   Data Reviewed: I have personally reviewed following labs and imaging studies  CBC: Recent Labs  Lab 02/27/17 1514 02/28/17 0423 03/01/17 0431 03/02/17 0338 03/03/17 0353  WBC 11.6* 10.0 11.9* 11.1* 13.1*  NEUTROABS 9.0* 7.8*  --   --   --   HGB 12.2* 10.9* 11.4* 10.8* 11.7*  HCT 35.3* 32.1* 33.7* 32.3* 34.6*  MCV 96.7 98.2 98.8 98.2 98.3  PLT 207 217 202 208 938   Basic Metabolic Panel: Recent Labs  Lab 02/27/17 1514  02/28/17 0423 03/01/17 0431 03/02/17 0338 03/03/17 0353  NA 129* 134* 135 135 134*  K 4.2 4.3 4.2 3.9 4.0  CL 99* 103 106 107 105  CO2 19* 21* 19* 19* 21*  GLUCOSE 130* 105* 107* 99 100*  BUN 30* 29* 29* 26* 26*  CREATININE 1.72* 1.81* 1.82* 1.63* 1.72*  CALCIUM 9.0 8.6* 8.1* 8.4* 8.7*  MG  --   --  2.1 2.2 2.1   GFR: Estimated Creatinine Clearance: 41 mL/min (A) (by C-G formula based on SCr of 1.72 mg/dL (H)). Liver Function Tests: Recent Labs  Lab 02/27/17 1514  AST 29  ALT 25  ALKPHOS 59  BILITOT 0.4  PROT 7.3  ALBUMIN 2.7*   Recent Labs  Lab 02/27/17 1514  LIPASE 46   No results for input(s): AMMONIA in the last 168 hours. Coagulation Profile: Recent Labs  Lab 02/28/17 0423  INR 1.28   Cardiac Enzymes: Recent Labs  Lab 02/27/17 1514  TROPONINI <0.03   BNP (last 3 results) No results for input(s): PROBNP in the last 8760 hours. HbA1C: No results for input(s): HGBA1C in the last 72 hours. CBG: No results for input(s): GLUCAP in the last 168 hours. Lipid Profile: No results for input(s): CHOL, HDL, LDLCALC, TRIG, CHOLHDL, LDLDIRECT in the last 72 hours. Thyroid Function Tests: No results for input(s): TSH, T4TOTAL, FREET4, T3FREE, THYROIDAB in the last 72 hours. Anemia Panel: No results for input(s): VITAMINB12, FOLATE, FERRITIN, TIBC, IRON, RETICCTPCT in the last 72 hours. Sepsis Labs: Recent Labs  Lab 02/27/17 1814 02/28/17 0423 03/01/17 0431 03/02/17 0338 03/03/17 0353  PROCALCITON  --  0.38 1.27 1.02 0.79  LATICACIDVEN 1.29  --   --   --   --     Recent Results (from the past 240 hour(s))  Blood culture (routine x 2)     Status: None (Preliminary result)   Collection Time: 02/27/17  6:00 PM  Result Value Ref Range Status   Specimen Description   Final    BLOOD BLOOD RIGHT HAND Performed at [facility], [address] [place], [place] [address]    Special Requests   Final    BOTTLES DRAWN AEROBIC AND ANAEROBIC Blood  Culture adequate volume Performed at [facility], [address] [place], [place] [address]    Culture   Final    NO GROWTH 4 DAYS Performed at [facility],  [address], [place], [place] [address]    Report Status PENDING  Incomplete  Blood culture (routine x 2)     Status: None (Preliminary result)   Collection Time: 02/27/17  6:15 PM  Result Value Ref Range Status   Specimen Description   Final    BLOOD LEFT ANTECUBITAL Performed at [facility], [address] [place], [place] [address]    Special Requests   Final    BOTTLES DRAWN AEROBIC AND ANAEROBIC Blood Culture adequate volume Performed at [facility], [address] [place], [place] [address]    Culture   Final    NO GROWTH 4 DAYS Performed at [facility], [place] [address] [place], [place] [address]    Report Status PENDING  Incomplete  Urine culture     Status: Abnormal   Collection Time: 02/27/17  7:40 PM  Result Value Ref Range Status   Specimen Description   Final    URINE, RANDOM Performed at [facility], [address] [place], [place] [address]    Special Requests   Final    NONE Performed at [facility], [address] [place], [place] [address]    Culture >=100,000 COLONIES/mL KLEBSIELLA PNEUMONIAE (A)  Final   Report Status 03/02/2017 FINAL  Final   Organism ID, Bacteria KLEBSIELLA PNEUMONIAE (A)  Final      Susceptibility   Klebsiella pneumoniae - MIC*    AMPICILLIN >=32 RESISTANT Resistant     CEFAZOLIN <=4 SENSITIVE Sensitive     CEFTRIAXONE <=1 SENSITIVE Sensitive     CIPROFLOXACIN <=0.25 SENSITIVE Sensitive     GENTAMICIN <=1 SENSITIVE Sensitive     IMIPENEM <=0.25 SENSITIVE Sensitive     NITROFURANTOIN 64 INTERMEDIATE Intermediate     TRIMETH/SULFA <=20 SENSITIVE Sensitive     AMPICILLIN/SULBACTAM 8 SENSITIVE Sensitive     PIP/TAZO <=4 SENSITIVE Sensitive     Extended ESBL NEGATIVE Sensitive     * >=100,000  COLONIES/mL KLEBSIELLA PNEUMONIAE  Respiratory Panel by PCR     Status: None   Collection Time: 02/28/17  4:14 PM  Result Value Ref Range Status   Adenovirus NOT DETECTED NOT DETECTED Final   Coronavirus 229E NOT DETECTED NOT DETECTED Final   Coronavirus HKU1 NOT DETECTED NOT DETECTED Final   Coronavirus NL63 NOT DETECTED NOT DETECTED Final   Coronavirus OC43 NOT DETECTED NOT DETECTED Final   Metapneumovirus NOT DETECTED NOT DETECTED Final   Rhinovirus / Enterovirus NOT DETECTED NOT DETECTED Final   Influenza A NOT DETECTED NOT DETECTED Final   Influenza B NOT DETECTED NOT DETECTED Final   Parainfluenza Virus 1 NOT DETECTED NOT DETECTED Final   Parainfluenza Virus 2 NOT DETECTED NOT DETECTED Final   Parainfluenza Virus 3 NOT DETECTED NOT DETECTED Final   Parainfluenza Virus 4 NOT DETECTED NOT DETECTED Final   Respiratory Syncytial Virus NOT DETECTED NOT DETECTED Final   Bordetella pertussis NOT DETECTED NOT DETECTED Final   Chlamydophila pneumoniae NOT DETECTED NOT DETECTED Final   Mycoplasma pneumoniae NOT DETECTED NOT DETECTED Final    Comment: Performed at [facility], [place], [place], [place] [address]  MRSA PCR Screening     Status: None   Collection Time: 03/01/17  9:40 AM  Result Value Ref Range Status   MRSA by PCR NEGATIVE NEGATIVE Final    Comment:        The GeneXpert MRSA Assay (FDA approved  for NASAL specimens only), is one component of a comprehensive MRSA colonization surveillance program. It is not intended to diagnose MRSA infection nor to guide or monitor treatment for MRSA infections. Performed at [facility], [place] [address], [place], [place] [address]          Radiology Studies: Dg Chest 2 View  Result Date: 03/03/2017 CLINICAL DATA:  Hypoxia. EXAM: CHEST  2 VIEW COMPARISON:  03/02/2017 as well as CT 02/27/2017 FINDINGS: Lungs are somewhat hypoinflated demonstrate persistent bilateral central perihilar opacification  slightly worse. Small layering bilateral pleural effusions. Cardiomediastinal silhouette and remainder the exam is unchanged. IMPRESSION: Mild interval worsening bilateral hazy central perihilar opacification which may be due to worsening interstitial edema as infection is also possible. Known bilateral hilar adenopathy. Small pleural effusions unchanged. Electronically Signed   By: Marin Olp M.D.   On: 03/03/2017 10:36   Dg Chest 2 View  Result Date: 03/02/2017 CLINICAL DATA:  Hypoxia. EXAM: CHEST  2 VIEW COMPARISON:  Chest radiograph 02/27/2017 and chest CT 02/27/2017 FINDINGS: Increased densities at the left lung base particularly in the retrocardiac region. Increased interstitial densities at the right lung base. Cardiac silhouette is prominent but similar to the previous examination and accentuated by the AP technique. Evidence for pleural effusions on the lateral view. IMPRESSION: Increased bibasilar chest densities, particularly on the left side. Findings are suggestive for a combination of pleural fluid and consolidation/volume loss. Electronically Signed   By: Markus Daft M.D.   On: 03/02/2017 13:53        Scheduled Meds: . feeding supplement  1 Container Oral BID BM  . feeding supplement (ENSURE ENLIVE)  237 mL Oral Q24H  . finasteride  5 mg Oral Daily  . fluticasone  1 spray Each Nare Daily  . furosemide  40 mg Intravenous BID  . heparin injection (subcutaneous)  5,000 Units Subcutaneous Q8H  . metoprolol tartrate  75 mg Oral BID  . mirtazapine  7.5 mg Oral QHS  . multivitamin with minerals  1 tablet Oral Daily  . pantoprazole  40 mg Oral Daily  . sodium chloride flush  3 mL Intravenous Q12H  . tamsulosin  0.4 mg Oral Daily   Continuous Infusions: . sodium chloride    . azithromycin 500 mg (03/02/17 1707)  . ceFEPime (MAXIPIME) IV 1 g (03/03/17 1204)     LOS: 3 days    Time spent: over 32 min    Fayrene Helper, MD [facility] Pager [phone]  If  7PM-7AM, please contact night-coverage [email or web address] Password [facility] 03/03/2017, 2:48 PM

## 2017-03-04 ENCOUNTER — Ambulatory Visit (HOSPITAL_BASED_OUTPATIENT_CLINIC_OR_DEPARTMENT_OTHER): Admit: 2017-03-04 | Payer: Medicare HMO | Admitting: Otolaryngology

## 2017-03-04 ENCOUNTER — Inpatient Hospital Stay (HOSPITAL_COMMUNITY): Payer: Medicare HMO | Admitting: Anesthesiology

## 2017-03-04 ENCOUNTER — Encounter (HOSPITAL_COMMUNITY)
Admission: EM | Disposition: A | Discharge status: Skilled Nursing Facility | Payer: Self-pay | Source: Home / Self Care | Attending: Family Medicine

## 2017-03-04 ENCOUNTER — Encounter (HOSPITAL_COMMUNITY): Payer: Self-pay | Admitting: Anesthesiology

## 2017-03-04 ENCOUNTER — Inpatient Hospital Stay (HOSPITAL_COMMUNITY): Payer: Medicare HMO

## 2017-03-04 ENCOUNTER — Encounter (HOSPITAL_BASED_OUTPATIENT_CLINIC_OR_DEPARTMENT_OTHER): Payer: Self-pay

## 2017-03-04 HISTORY — PX: INGUINAL LYMPH NODE BIOPSY: SHX5865

## 2017-03-04 LAB — COMPREHENSIVE METABOLIC PANEL
ALT: 17 U/L (ref 17–63)
AST: 20 U/L (ref 15–41)
Albumin: 2.1 g/dL — ABNORMAL LOW (ref 3.5–5.0)
Alkaline Phosphatase: 48 U/L (ref 38–126)
Anion gap: 8 (ref 5–15)
BILIRUBIN TOTAL: 0.5 mg/dL (ref 0.3–1.2)
BUN: 32 mg/dL — AB (ref 6–20)
CHLORIDE: 107 mmol/L (ref 101–111)
CO2: 21 mmol/L — ABNORMAL LOW (ref 22–32)
Calcium: 8.6 mg/dL — ABNORMAL LOW (ref 8.9–10.3)
Creatinine, Ser: 2.03 mg/dL — ABNORMAL HIGH (ref 0.61–1.24)
GFR, EST AFRICAN AMERICAN: 33 mL/min — AB (ref >60)
GFR, EST NON AFRICAN AMERICAN: 29 mL/min — AB (ref >60)
Glucose, Bld: 90 mg/dL (ref 65–99)
POTASSIUM: 4 mmol/L (ref 3.5–5.1)
Sodium: 136 mmol/L (ref 135–145)
TOTAL PROTEIN: 6.5 g/dL (ref 6.5–8.1)

## 2017-03-04 LAB — CULTURE, BLOOD (ROUTINE X 2)
Culture: NO GROWTH
Culture: NO GROWTH
SPECIAL REQUESTS: ADEQUATE
Special Requests: ADEQUATE

## 2017-03-04 LAB — PROCALCITONIN: Procalcitonin: 0.89 ng/mL

## 2017-03-04 LAB — CBC
HEMATOCRIT: 35.4 % — AB (ref 39.0–52.0)
Hemoglobin: 11.5 g/dL — ABNORMAL LOW (ref 13.0–17.0)
MCH: 32.1 pg (ref 26.0–34.0)
MCHC: 32.5 g/dL (ref 30.0–36.0)
MCV: 98.9 fL (ref 78.0–100.0)
PLATELETS: 200 10*3/uL (ref 150–400)
RBC: 3.58 MIL/uL — ABNORMAL LOW (ref 4.22–5.81)
RDW: 16.4 % — AB (ref 11.5–15.5)
WBC: 12.3 10*3/uL — AB (ref 4.0–10.5)

## 2017-03-04 LAB — LEGIONELLA PNEUMOPHILA SEROGP 1 UR AG: L. pneumophila Serogp 1 Ur Ag: NEGATIVE

## 2017-03-04 LAB — MAGNESIUM: MAGNESIUM: 2.1 mg/dL (ref 1.7–2.4)

## 2017-03-04 SURGERY — BIOPSY, LYMPH NODE, INGUINAL, OPEN
Anesthesia: Monitor Anesthesia Care | Laterality: Bilateral

## 2017-03-04 SURGERY — SEPTOPLASTY, NOSE, WITH NASAL TURBINATE REDUCTION
Anesthesia: General | Laterality: Bilateral

## 2017-03-04 MED ORDER — SODIUM CHLORIDE 0.9 % IV BOLUS (SEPSIS)
500.0000 mL | Freq: Once | INTRAVENOUS | Status: AC
  Administered 2017-03-04: 500 mL via INTRAVENOUS

## 2017-03-04 MED ORDER — LABETALOL HCL 5 MG/ML IV SOLN
INTRAVENOUS | Status: AC
  Administered 2017-03-04: 5 mg via INTRAVENOUS
  Filled 2017-03-04: qty 4

## 2017-03-04 MED ORDER — LABETALOL HCL 5 MG/ML IV SOLN
5.0000 mg | Freq: Once | INTRAVENOUS | Status: AC
  Administered 2017-03-04: 5 mg via INTRAVENOUS

## 2017-03-04 MED ORDER — SODIUM CHLORIDE FLUSH 0.9 % IV SOLN
INTRAVENOUS | Status: AC
  Filled 2017-03-04: qty 10

## 2017-03-04 MED ORDER — SODIUM CHLORIDE 0.9 % IV BOLUS (SEPSIS)
1000.0000 mL | Freq: Once | INTRAVENOUS | Status: AC
  Administered 2017-03-04: 1000 mL via INTRAVENOUS

## 2017-03-04 MED ORDER — MEPERIDINE HCL 50 MG/ML IJ SOLN: 6.2500 mg | INTRAMUSCULAR | Status: DC | PRN

## 2017-03-04 MED ORDER — GUAIFENESIN ER 600 MG PO TB12
1200.0000 mg | ORAL_TABLET | Freq: Two times a day (BID) | ORAL | Status: DC | PRN
  Administered 2017-03-04 – 2017-03-07 (×4): 1200 mg via ORAL
  Filled 2017-03-04 (×4): qty 2

## 2017-03-04 MED ORDER — FENTANYL CITRATE (PF) 100 MCG/2ML IJ SOLN
INTRAMUSCULAR | Status: AC
  Filled 2017-03-04: qty 2

## 2017-03-04 MED ORDER — BUPIVACAINE LIPOSOME 1.3 % IJ SUSP
20.0000 mL | Freq: Once | INTRAMUSCULAR | Status: AC
Start: 2017-03-04 — End: 2017-03-04
  Administered 2017-03-04: 20 mL
  Filled 2017-03-04: qty 20

## 2017-03-04 MED ORDER — ACETAMINOPHEN 10 MG/ML IV SOLN
INTRAVENOUS | Status: AC
  Filled 2017-03-04: qty 100

## 2017-03-04 MED ORDER — LEVALBUTEROL HCL 0.63 MG/3ML IN NEBU
0.6300 mg | INHALATION_SOLUTION | Freq: Three times a day (TID) | RESPIRATORY_TRACT | Status: DC
  Administered 2017-03-04 – 2017-03-09 (×14): 0.63 mg via RESPIRATORY_TRACT
  Filled 2017-03-04 (×16): qty 3

## 2017-03-04 MED ORDER — 0.9 % SODIUM CHLORIDE (POUR BTL) OPTIME
TOPICAL | Status: DC | PRN
  Administered 2017-03-04: 1000 mL

## 2017-03-04 MED ORDER — PROPOFOL 10 MG/ML IV BOLUS
INTRAVENOUS | Status: DC | PRN
  Administered 2017-03-04: 5 mg via INTRAVENOUS

## 2017-03-04 MED ORDER — SODIUM CHLORIDE 0.9 % IV SOLN
INTRAVENOUS | Status: DC
  Administered 2017-03-04: 100 mL via INTRAVENOUS
  Administered 2017-03-05: 06:00:00 via INTRAVENOUS

## 2017-03-04 MED ORDER — MIDAZOLAM HCL 2 MG/2ML IJ SOLN
INTRAMUSCULAR | Status: AC
  Filled 2017-03-04: qty 2

## 2017-03-04 MED ORDER — LEVALBUTEROL HCL 1.25 MG/0.5ML IN NEBU
1.2500 mg | INHALATION_SOLUTION | Freq: Once | RESPIRATORY_TRACT | Status: AC
  Administered 2017-03-04: 1.25 mg via RESPIRATORY_TRACT

## 2017-03-04 MED ORDER — LIDOCAINE HCL 1 % IJ SOLN
INTRAMUSCULAR | Status: DC | PRN
  Administered 2017-03-04: 15 mL

## 2017-03-04 MED ORDER — DRONABINOL 5 MG PO CAPS
5.0000 mg | ORAL_CAPSULE | Freq: Two times a day (BID) | ORAL | Status: DC
  Administered 2017-03-04 – 2017-03-14 (×16): 5 mg via ORAL
  Filled 2017-03-04 (×10): qty 1
  Filled 2017-03-04: qty 2
  Filled 2017-03-04 (×2): qty 1
  Filled 2017-03-04: qty 2
  Filled 2017-03-04: qty 1
  Filled 2017-03-04: qty 2

## 2017-03-04 MED ORDER — BUPIVACAINE HCL (PF) 0.25 % IJ SOLN
INTRAMUSCULAR | Status: AC
  Filled 2017-03-04: qty 30

## 2017-03-04 MED ORDER — PROPOFOL 10 MG/ML IV BOLUS
INTRAVENOUS | Status: AC
  Filled 2017-03-04: qty 20

## 2017-03-04 MED ORDER — LIDOCAINE HCL (PF) 1 % IJ SOLN
INTRAMUSCULAR | Status: AC
  Filled 2017-03-04: qty 30

## 2017-03-04 MED ORDER — LIP MEDEX EX OINT
TOPICAL_OINTMENT | CUTANEOUS | Status: AC
  Filled 2017-03-04: qty 7

## 2017-03-04 MED ORDER — SALINE SPRAY 0.65 % NA SOLN
1.0000 | NASAL | Status: DC | PRN
  Filled 2017-03-04: qty 44

## 2017-03-04 MED ORDER — LACTATED RINGERS IV SOLN: INTRAVENOUS | Status: DC

## 2017-03-04 MED ORDER — ALPRAZOLAM 0.25 MG PO TABS
0.2500 mg | ORAL_TABLET | Freq: Once | ORAL | Status: AC
  Administered 2017-03-04: 0.25 mg via ORAL
  Filled 2017-03-04: qty 1

## 2017-03-04 MED ORDER — FENTANYL CITRATE (PF) 100 MCG/2ML IJ SOLN: 25.0000 ug | INTRAMUSCULAR | Status: DC | PRN

## 2017-03-04 MED ORDER — ACETAMINOPHEN 10 MG/ML IV SOLN
1000.0000 mg | Freq: Once | INTRAVENOUS | Status: AC
  Administered 2017-03-04: 1000 mg via INTRAVENOUS

## 2017-03-04 MED ORDER — LEVALBUTEROL HCL 1.25 MG/0.5ML IN NEBU
INHALATION_SOLUTION | RESPIRATORY_TRACT | Status: AC
  Administered 2017-03-04: 1.25 mg via RESPIRATORY_TRACT
  Filled 2017-03-04: qty 0.5

## 2017-03-04 MED ORDER — PROMETHAZINE HCL 25 MG/ML IJ SOLN: 6.2500 mg | INTRAMUSCULAR | Status: DC | PRN

## 2017-03-04 SURGICAL SUPPLY — 34 items
ADH SKN CLS APL DERMABOND .7 (GAUZE/BANDAGES/DRESSINGS) ×1
APL SKNCLS STERI-STRIP NONHPOA (GAUZE/BANDAGES/DRESSINGS)
BENZOIN TINCTURE PRP APPL 2/3 (GAUZE/BANDAGES/DRESSINGS) ×1 IMPLANT
BLADE HEX COATED 2.75 (ELECTRODE) ×1 IMPLANT
BLADE SURG 15 STRL LF DISP TIS (BLADE) ×1 IMPLANT
BLADE SURG 15 STRL SS (BLADE)
CHLORAPREP W/TINT 26ML (MISCELLANEOUS) ×3 IMPLANT
CLOSURE WOUND 1/2 X4 (GAUZE/BANDAGES/DRESSINGS)
DECANTER SPIKE VIAL GLASS SM (MISCELLANEOUS) ×3 IMPLANT
DERMABOND ADVANCED (GAUZE/BANDAGES/DRESSINGS) ×2
DERMABOND ADVANCED .7 DNX12 (GAUZE/BANDAGES/DRESSINGS) ×1 IMPLANT
DRAPE LAPAROTOMY TRNSV 102X78 (DRAPE) ×3 IMPLANT
DRAPE UTILITY 15X26 (DRAPE) ×1 IMPLANT
ELECT PENCIL ROCKER SW 15FT (MISCELLANEOUS) ×3 IMPLANT
ELECT REM PT RETURN 15FT ADLT (MISCELLANEOUS) ×3 IMPLANT
GAUZE SPONGE 4X4 12PLY STRL (GAUZE/BANDAGES/DRESSINGS) ×3 IMPLANT
GLOVE BIOGEL PI IND STRL 7.0 (GLOVE) ×1 IMPLANT
GLOVE BIOGEL PI INDICATOR 7.0 (GLOVE) ×2
KIT BASIN OR (CUSTOM PROCEDURE TRAY) ×3 IMPLANT
NDL HYPO 25X1 1.5 SAFETY (NEEDLE) ×1 IMPLANT
NEEDLE HYPO 25X1 1.5 SAFETY (NEEDLE) IMPLANT
NS IRRIG 1000ML POUR BTL (IV SOLUTION) ×3 IMPLANT
PACK BASIC VI WITH GOWN DISP (CUSTOM PROCEDURE TRAY) ×1 IMPLANT
PACK GENERAL/GYN (CUSTOM PROCEDURE TRAY) ×2 IMPLANT
SPONGE LAP 4X18 X RAY DECT (DISPOSABLE) ×1 IMPLANT
STRIP CLOSURE SKIN 1/2X4 (GAUZE/BANDAGES/DRESSINGS) ×1 IMPLANT
SUT MNCRL AB 4-0 PS2 18 (SUTURE) ×1 IMPLANT
SUT SILK 2 0 SH (SUTURE) ×1 IMPLANT
SUT VIC AB 3-0 SH 18 (SUTURE) ×3 IMPLANT
SUT VIC AB 4-0 PS2 18 (SUTURE) ×2 IMPLANT
SYR BULB IRRIGATION 50ML (SYRINGE) ×1 IMPLANT
SYR CONTROL 10ML LL (SYRINGE) ×1 IMPLANT
TOWEL OR 17X26 10 PK STRL BLUE (TOWEL DISPOSABLE) ×3 IMPLANT
YANKAUER SUCT BULB TIP 10FT TU (MISCELLANEOUS) ×1 IMPLANT

## 2017-03-04 NOTE — Progress Notes (Signed)
Pharmacy Antibiotic Note  Jose Meza is a 84 y.o. male admitted on 02/27/2017 with pneumonia.  Pharmacy has been consulted for cefepime dosing. 03/04/2017 Abx Day #6 T max 101.1 this morning, PCT 0.89, WBC 12.3. 2/17 CXR with mild worsening- interstitial edema vs infxn. Cr 2.03.  Cr Cl ~ 35 ml/min   Plan: Continues on Cefepime 1 gm IV q12  Continues on azithromycin 500 mg IV q24 - could change to PO F/u renal fxn, wbc, temp,   Height: 6\' 5"  (195.6 cm) Weight: 214 lb 4.6 oz (97.2 kg) IBW/kg (Calculated) : 89.1  Temp (24hrs), Avg:98.9 F (37.2 C), Min:97.7 F (36.5 C), Max:101.1 F (38.4 C)  Recent Labs  Lab 02/27/17 1814 02/28/17 0423 03/01/17 0431 03/02/17 0338 03/03/17 0353 03/04/17 0407  WBC  --  10.0 11.9* 11.1* 13.1* 12.3*  CREATININE  --  1.81* 1.82* 1.63* 1.72* 2.03*  LATICACIDVEN 1.29  --   --   --   --   --     Estimated Creatinine Clearance: 34.7 mL/min (A) (by C-G formula based on SCr of 2.03 mg/dL (H)).    Allergies  Allergen Reactions  . Sulfa Antibiotics Other (See Comments)    Paradoxical effect. Patient states Sulfa does not work for him.  Antimicrobials this admission: CTX 2/13 >> 2/14 Azith 2/13 >> Cefepime 2/15 >>  Dose adjustments this admission:  Microbiology results: 2/13 flu neg 2/14 strep pneumo neg 2/15 MRSA: neg 2/14 resp panel:  neg 2/13 BCx2: ngtd 2/13 Ucx: 100K Klebisella pan sens except amp and nitro.    Thank you for allowing pharmacy to be a part of this patient's care.  Eudelia Bunch, Pharm.D. [phone] 03/04/2017 10:21 AM

## 2017-03-04 NOTE — Op Note (Signed)
03/04/2017  10:36 AM  PATIENT:  Jose Meza  84 y.o. male  Patient Care Team: Lajean Manes, MD as PCP - General (Internal Medicine) Bo Merino, MD as Consulting Physician (Rheumatology) Rana Snare, MD as Consulting Physician (Urology) Kristeen Miss, MD as Consulting Physician (Neurosurgery) Ronald Lobo, MD as Consulting Physician (Gastroenterology) Sharyne Peach, MD as Consulting Physician (Ophthalmology) Garald Balding, MD (Orthopedic Surgery)  PRE-OPERATIVE DIAGNOSIS:  lymphadenopathy  POST-OPERATIVE DIAGNOSIS:  lymphadenopathy  PROCEDURE:   INGUINAL LYMPH NODE BIOPSY LEFT   Surgeon(s): Leighton Ruff, MD  ASSISTANT: none   ANESTHESIA:   local  EBL:  Total I/O In: 700 [I.V.:700] Out: -   DRAINS: none   SPECIMEN:  Source of Specimen:  L inguinal node  DISPOSITION OF SPECIMEN:  PATHOLOGY  COUNTS:  YES  PLAN OF CARE: Pt admitted  PATIENT DISPOSITION:  PACU - hemodynamically stable.  INDICATION: 84 y.o. M who presents to the hospital with lymphadenopathy that was found after CT chest performed for chronic cough and weight loss.    OR FINDINGS: Lymphadenopathy of the left groin  DESCRIPTION: the patient was identified in the preoperative holding area and taken to the OR where they were laid supine on the operating room table.  MAC anesthesia was induced without difficulty. SCDs were also noted to be in place prior to the initiation of anesthesia.  The patient was then prepped and draped in the usual sterile fashion.   A surgical timeout was performed indicating the correct patient, procedure, positioning and need for preoperative antibiotics.   I began by palpating the lymph node in the left groin.  I then placed a local field block with 1% lidocaine.  An incision was made over this using a 15 blade scalpel.  Dissection was carried down through the subcutaneous tissues bluntly.  The lymph node was identified and elevated out of the wound.   Dissection was carried around the lymph node using electrocautery.  The lymph channel was then suture-ligated and the lymph node was removed.  This was sent fresh to pathology for evaluation.  The wound was hemostatic.  The subcutaneous tissue was closed in layers using interrupted 3-0 Vicryl sutures.  The skin was closed with 4-0 Vicryl suture and Dermabond.  The patient tolerated the procedure well was sent to the postanesthesia care unit in stable condition.  All counts were correct per operating room staff.

## 2017-03-04 NOTE — Progress Notes (Signed)
PROGRESS NOTE    [name]  ZOX:[id number] DOB: 1931/12/22 DOA: 02/27/2017 PCP: Lajean Manes, MD   Brief Narrative:  Jose Meza is Jose Meza 84 y.o. male with medical history significant of chronic kidney disease, rheumatoid arthritis chronically on steroids, hypertension transferred here from [facility] after finding use disease in his chest concerning for lymphoma.  Patient arrived there with complaints of coughing for over [name] week with chills and low-grade fevers at home.  Is lost over 20 pounds in the last 4-6 weeks.  He is recently been treated for prostatitis with an unknown antibiotic Indy Kuck month ago.  His symptoms have persisted however despite recent steroid taper for bronchitis also.  He denies any nausea vomiting or diarrhea.  He has not noticed any swollen areas on his body lately.  He denies any rashes.  CTA was done which showed diffuse disease concerning for lymphoma with possible pneumonia.  Patient is referred for admission for hypoxia with 90% O2 sats on room air along with possible pneumonia.  He denies any pain.  Assessment & Plan:   Principal Problem:   PNA (pneumonia) Active Problems:   Hyperlipidemia   Hypertension   CKD (chronic kidney disease) stage 3, GFR 30-59 ml/min (HCC)   Rheumatoid arthritis of multiple sites with negative rheumatoid factor (HCC)   Pneumonia   Generalized lymphadenopathy  Acute Hypoxic Respiratory Failure:  Currently being treated for community acquired pneumonia, but patient also with widepread lymphadenopathy and pulmonary nodules and interlobular septal thickening throughout lungs which could be contributing.  Immunocompromised with treatment for RA on xeljanz, recently treated with steroids as well for past few days.  Of note, neg quant gold 10/2016 and denies risk factors for exposure.  CXR 2/17 with "mild interval worsening bilateral hazy perihilar opacification which may be due to worsening interstitial edema as infection is also  possible.  Known bilateral hilar adenopathy.  Small pleural effusions unchanged." Repeat CXR today pending Actually seems improved from resp standpoint today on less O2.   Lasix 40 BID for possible overload (follow BNP - wnl) Duonebs, albuterol prn Ceftriaxone (2/13-2/15) and azithromycin (2/13 - )  Continue cefepime (2/15 - )  MRSA PCR negative Sputum cx pending, urine strep negative, urine legionella pending Negative influenza RVP negative PCT [place] bit elevated Will consider adding on prednisone as well for pneumonia, but holding for now with bx SLP eval - decreased to full liquid diet. SLP following.  In's outs, daily weights  Filed Weights   03/02/17 0422 03/03/17 0411 03/04/17 0454  Weight: 92.5 kg (203 lb 14.8 oz) 96 kg (211 lb 10.3 oz) 97.2 kg (214 lb 4.6 oz)     Leukocytosis: slightly worsening, 2/2 above. Follow.  PCT downtrending.   Diffuse Lymphadenopathy  Weight Loss  Fevers: imaging findings concerning for lymphoma per radiology.  He notes weight loss and low grade temps over past several months.  Denies night sweats.  Also on differential based on imaging findings is metastatic primary bronchogenic carcinoma. S/p biopsy of inguinal LN by IR, but unrevealing.  S/p repeat inguinal LN biopsy 2/18, follow pathology Nutrition c/s SLP as above Marinol added for appetite  Upper Extremity Swelling  Superficial Thrombosis:  Follow dopplers, suspect 2/2 volume overload.  [place] with superficial thrombosis in bilateral cephalic veins, but negative for DVT.  Persistent today.  Continue to follow.   UTI  Hematuria: UA nitrite positive, bacteria, and 6-30 WBC, though with positive squams.  Follow cx.  Continue cefepime as above.  Hx of prostatitis.  Ucx with klebsiella sensitive to cephalosporins.  Follow. Needs outpatient f/u for hematuria   Hypotension  Tachycardia: Seems like it got worse overnight and into this morning during procedure.  He'd been diuresed for concern for volume  overload.  Will bolus and restart IVF.  Continue metoprolol 75 mg BID.  TSH 6.45, would repeat outside of acute illness  Echo with EF 53-61%, grade 1 diastolic dysfunction (see report) Increase beta blocker as needed  Anemia: slightly downtrended, likely 2/2 IVF.  Continue to monitor. Stable.   Leukocytosis: 2/2 above, continue to monitor. stable  Rheumatoid Arthritis:  Hold xeljanz  AKI on CKD Stage III: baseline creatinine looks to be between 1.5-1.7. Increased to 2.03 in the setting of diuresis over the past day.  IVF as above  HTN: metoprolol  GERD: PPI  Anxiety: ativan bid prn, will increase this to 1 mg BID prn.  Stop remeron and hydroxyzine as these aren't helping.  Asking for something additional for sleep as well, but discussed we'll make 1 change with resp problems above.  Ectatic Infrarenal Abdominal Aorta: follow up as noted per rads  DVT prophylaxis: heparin Code Status: full code Family Communication: discussed with daughter 2/18 Disposition Plan: pending improvement in resp status and bx results   Consultants:   IR  Procedures:   US Guided Biopsy of L inguinal LN 02/28/17  Antimicrobials:  Anti-infectives (From admission, onward)   Start     Dose/Rate Route Frequency Ordered Stop   03/01/17 1300  ceFEPIme (MAXIPIME) 1 g in sodium chloride 0.9 % 100 mL IVPB     1 g 200 mL/hr over 30 Minutes Intravenous 2 times daily 03/01/17 1202 03/11/17 0959   02/28/17 1800  cefTRIAXone (ROCEPHIN) 1 g in sodium chloride 0.9 % 100 mL IVPB  Status:  Discontinued     1 g 200 mL/hr over 30 Minutes Intravenous Every 24 hours 02/27/17 2205 03/01/17 1151   02/28/17 1800  azithromycin (ZITHROMAX) 500 mg in sodium chloride 0.9 % 250 mL IVPB     500 mg 250 mL/hr over 60 Minutes Intravenous Every 24 hours 02/27/17 2205 03/07/17 1759   02/27/17 1824  azithromycin (ZITHROMAX) 500 MG injection    Comments:  Peel, Adrienne   : cabinet override      02/27/17 1824 02/28/17 0629    02/27/17 1800  cefTRIAXone (ROCEPHIN) 1 g in sodium chloride 0.9 % 100 mL IVPB     1 g 200 mL/hr over 30 Minutes Intravenous  Once 02/27/17 1757 02/27/17 1905   02/27/17 1800  azithromycin (ZITHROMAX) 500 mg in sodium chloride 0.9 % 250 mL IVPB     500 mg 250 mL/hr over 60 Minutes Intravenous  Once 02/27/17 1757 02/27/17 2034     Subjective: SOB seems improved.  Lots of difficulty breathing through nose, was going to have sinus surgery.  Objective: Vitals:   03/04/17 0941 03/04/17 1049 03/04/17 1100 03/04/17 1122  BP:  105/62 (!) 90/54 (!) 92/50  Pulse: (!) 118 (!) 116 (!) 119 (!) 118  Resp: (!) 33 (!) 28 (!) 26   Temp:  97.9 F (36.6 C) 98.7 F (37.1 C) 98.2 F (36.8 C)  TempSrc:      SpO2: 96% 97% 97% 97%  Weight:      Height:        Intake/Output Summary (Last 24 hours) at 03/04/2017 1209 Last data filed at 03/04/2017 1045 Gross per 24 hour  Intake 1340 ml  Output 5 ml  Net 1335 ml   Filed Weights   03/02/17 0422 03/03/17 0411 03/04/17 0454  Weight: 92.5 kg (203 lb 14.8 oz) 96 kg (211 lb 10.3 oz) 97.2 kg (214 lb 4.6 oz)    Examination:  General: No acute distress. Cardiovascular: Tachycardic.  Heart sounds show Shyne Lehrke regular rate, and rhythm. No gallops or rubs. No murmurs. No JVD. Lungs: Good air movement bilaterally, some crackles at bases, but overall seems improved.  On 1 L [place]. Abdomen: Soft, nontender, nondistended with normal active bowel sounds. No masses. No hepatosplenomegaly. Neurological: Alert and oriented 3. Moves all extremities 4. Cranial nerves II through XII grossly intact. Skin: Warm and dry. No rashes or lesions. Extremities: No clubbing or cyanosis. No edema. Psychiatric: Mood and affect are normal. Insight and judgment are appropirate.   Data Reviewed: I have personally reviewed following labs and imaging studies  CBC: Recent Labs  Lab 02/27/17 1514 02/28/17 0423 03/01/17 0431 03/02/17 0338 03/03/17 0353 03/04/17 0407  WBC 11.6* 10.0  11.9* 11.1* 13.1* 12.3*  NEUTROABS 9.0* 7.8*  --   --   --   --   HGB 12.2* 10.9* 11.4* 10.8* 11.7* 11.5*  HCT 35.3* 32.1* 33.7* 32.3* 34.6* 35.4*  MCV 96.7 98.2 98.8 98.2 98.3 98.9  PLT 207 217 202 208 191 169   Basic Metabolic Panel: Recent Labs  Lab 02/28/17 0423 03/01/17 0431 03/02/17 0338 03/03/17 0353 03/04/17 0407  NA 134* 135 135 134* 136  K 4.3 4.2 3.9 4.0 4.0  CL 103 106 107 105 107  CO2 21* 19* 19* 21* 21*  GLUCOSE 105* 107* 99 100* 90  BUN 29* 29* 26* 26* 32*  CREATININE 1.81* 1.82* 1.63* 1.72* 2.03*  CALCIUM 8.6* 8.1* 8.4* 8.7* 8.6*  MG  --  2.1 2.2 2.1 2.1   GFR: Estimated Creatinine Clearance: 34.7 mL/min (Wylee Dorantes) (by C-G formula based on SCr of 2.03 mg/dL (H)). Liver Function Tests: Recent Labs  Lab 02/27/17 1514 03/04/17 0407  AST 29 20  ALT 25 17  ALKPHOS 59 48  BILITOT 0.4 0.5  PROT 7.3 6.5  ALBUMIN 2.7* 2.1*   Recent Labs  Lab 02/27/17 1514  LIPASE 46   No results for input(s): AMMONIA in the last 168 hours. Coagulation Profile: Recent Labs  Lab 02/28/17 0423  INR 1.28   Cardiac Enzymes: Recent Labs  Lab 02/27/17 1514  TROPONINI <0.03   BNP (last 3 results) No results for input(s): PROBNP in the last 8760 hours. HbA1C: No results for input(s): HGBA1C in the last 72 hours. CBG: No results for input(s): GLUCAP in the last 168 hours. Lipid Profile: No results for input(s): CHOL, HDL, LDLCALC, TRIG, CHOLHDL, LDLDIRECT in the last 72 hours. Thyroid Function Tests: No results for input(s): TSH, T4TOTAL, FREET4, T3FREE, THYROIDAB in the last 72 hours. Anemia Panel: No results for input(s): VITAMINB12, FOLATE, FERRITIN, TIBC, IRON, RETICCTPCT in the last 72 hours. Sepsis Labs: Recent Labs  Lab 02/27/17 1814  03/01/17 0431 03/02/17 0338 03/03/17 0353 03/04/17 0407  PROCALCITON  --    < > 1.27 1.02 0.79 0.89  LATICACIDVEN 1.29  --   --   --   --   --    < > = values in this interval not displayed.    Recent Results (from the past  240 hour(s))  Blood culture (routine x 2)     Status: None (Preliminary result)   Collection Time: 02/27/17  6:00 PM  Result Value Ref Range Status   Specimen Description  Final    BLOOD BLOOD RIGHT HAND Performed at [facility], [address] [place], [place] [address]    Special Requests   Final    BOTTLES DRAWN AEROBIC AND ANAEROBIC Blood Culture adequate volume Performed at [facility], [address] [place], [place] [address]    Culture   Final    NO GROWTH 4 DAYS Performed at [facility], [place] [address], [place], [place] [address]    Report Status PENDING  Incomplete  Blood culture (routine x 2)     Status: None (Preliminary result)   Collection Time: 02/27/17  6:15 PM  Result Value Ref Range Status   Specimen Description   Final    BLOOD LEFT ANTECUBITAL Performed at [facility], [address] [place], [place] [address]    Special Requests   Final    BOTTLES DRAWN AEROBIC AND ANAEROBIC Blood Culture adequate volume Performed at [facility], [address] [place], [place] [address]    Culture   Final    NO GROWTH 4 DAYS Performed at [facility], [place] [address] [place], [place] [address]    Report Status PENDING  Incomplete  Urine culture     Status: Abnormal   Collection Time: 02/27/17  7:40 PM  Result Value Ref Range Status   Specimen Description   Final    URINE, RANDOM Performed at [facility], [address] [place], [place] [address]    Special Requests   Final    NONE Performed at [facility], [address] [place], [place] [address]    Culture >=100,000 COLONIES/mL KLEBSIELLA PNEUMONIAE (Davonn Flanery)  Final   Report Status 03/02/2017 FINAL  Final   Organism ID, Bacteria KLEBSIELLA PNEUMONIAE (Elfrieda Espino)  Final      Susceptibility   Klebsiella pneumoniae - MIC*    AMPICILLIN >=32 RESISTANT Resistant     CEFAZOLIN <=4 SENSITIVE Sensitive     CEFTRIAXONE <=1 SENSITIVE  Sensitive     CIPROFLOXACIN <=0.25 SENSITIVE Sensitive     GENTAMICIN <=1 SENSITIVE Sensitive     IMIPENEM <=0.25 SENSITIVE Sensitive     NITROFURANTOIN 64 INTERMEDIATE Intermediate     TRIMETH/SULFA <=20 SENSITIVE Sensitive     AMPICILLIN/SULBACTAM 8 SENSITIVE Sensitive     PIP/TAZO <=4 SENSITIVE Sensitive     Extended ESBL NEGATIVE Sensitive     * >=100,000 COLONIES/mL KLEBSIELLA PNEUMONIAE  Respiratory Panel by PCR     Status: None   Collection Time: 02/28/17  4:14 PM  Result Value Ref Range Status   Adenovirus NOT DETECTED NOT DETECTED Final   Coronavirus 229E NOT DETECTED NOT DETECTED Final   Coronavirus HKU1 NOT DETECTED NOT DETECTED Final   Coronavirus NL63 NOT DETECTED NOT DETECTED Final   Coronavirus OC43 NOT DETECTED NOT DETECTED Final   Metapneumovirus NOT DETECTED NOT DETECTED Final   Rhinovirus / Enterovirus NOT DETECTED NOT DETECTED Final   Influenza Ponciano Shealy NOT DETECTED NOT DETECTED Final   Influenza B NOT DETECTED NOT DETECTED Final   Parainfluenza Virus 1 NOT DETECTED NOT DETECTED Final   Parainfluenza Virus 2 NOT DETECTED NOT DETECTED Final   Parainfluenza Virus 3 NOT DETECTED NOT DETECTED Final   Parainfluenza Virus 4 NOT DETECTED NOT DETECTED Final   Respiratory Syncytial Virus NOT DETECTED NOT DETECTED Final   Bordetella pertussis NOT DETECTED NOT DETECTED Final   Chlamydophila pneumoniae NOT DETECTED NOT DETECTED  Final   Mycoplasma pneumoniae NOT DETECTED NOT DETECTED Final    Comment: Performed at [facility], [place] [address], [place], [place] [address]  MRSA PCR Screening     Status: None   Collection Time: 03/01/17  9:40 AM  Result Value Ref Range Status   MRSA by PCR NEGATIVE NEGATIVE Final    Comment:        The GeneXpert MRSA Assay (FDA approved for NASAL specimens only), is one component of Claudeen Leason comprehensive MRSA colonization surveillance program. It is not intended to diagnose MRSA infection nor to guide or monitor treatment for MRSA  infections. Performed at [facility], [place] [address] [place], [place] [address]          Radiology Studies: Dg Chest 2 View  Result Date: 03/03/2017 CLINICAL DATA:  Hypoxia. EXAM: CHEST  2 VIEW COMPARISON:  03/02/2017 as well as CT 02/27/2017 FINDINGS: Lungs are somewhat hypoinflated demonstrate persistent bilateral central perihilar opacification slightly worse. Small layering bilateral pleural effusions. Cardiomediastinal silhouette and remainder the exam is unchanged. IMPRESSION: Mild interval worsening bilateral hazy central perihilar opacification which may be due to worsening interstitial edema as infection is also possible. Known bilateral hilar adenopathy. Small pleural effusions unchanged. Electronically Signed   By: Marin Olp M.D.   On: 03/03/2017 10:36   Dg Chest 2 View  Result Date: 03/02/2017 CLINICAL DATA:  Hypoxia. EXAM: CHEST  2 VIEW COMPARISON:  Chest radiograph 02/27/2017 and chest CT 02/27/2017 FINDINGS: Increased densities at the left lung base particularly in the retrocardiac region. Increased interstitial densities at the right lung base. Cardiac silhouette is prominent but similar to the previous examination and accentuated by the AP technique. Evidence for pleural effusions on the lateral view. IMPRESSION: Increased bibasilar chest densities, particularly on the left side. Findings are suggestive for Leeon Makar combination of pleural fluid and consolidation/volume loss. Electronically Signed   By: Markus Daft M.D.   On: 03/02/2017 13:53   Vas [place] Upper Extremity Venous Duplex  Result Date: 03/03/2017 UPPER VENOUS STUDY  Indications: Edema Examination Guidelines: Ciarrah Rae complete evaluation includes B-mode imaging, spectral doppler, color doppler, and power doppler as needed of all accessible portions of each vessel. Bilateral testing is considered an integral part of Lawrie Tunks complete examination. Limited examinations for reoccurring indications may be performed as noted.  Findings: +----------+--------------+--------+------------+------------+-------+---------+ RIGHT        Patency    ThrombusCompressibleSpontaneous Phasic Augmented +----------+--------------+--------+------------+------------+-------+---------+ IJV           patent                yes     Spontaneous.Phasic.          +----------+--------------+--------+------------+------------+-------+---------+ Subclavian    patent                        Spontaneous.Phasic.          +----------+--------------+--------+------------+------------+-------+---------+ Axillary      patent                        Spontaneous.Phasic.          +----------+--------------+--------+------------+------------+-------+---------+ Brachial      patent                yes     Spontaneous.Phasic.          +----------+--------------+--------+------------+------------+-------+---------+ Radial        patent  yes                                  +----------+--------------+--------+------------+------------+-------+---------+ Ulnar         patent                yes                                  +----------+--------------+--------+------------+------------+-------+---------+ Cephalic    partially                no                                               occluded                                                    +----------+--------------+--------+------------+------------+-------+---------+ Basilic       patent                yes                                  +----------+--------------+--------+------------+------------+-------+---------+ +----------+--------------+--------+------------+------------+-------+---------+ LEFT         Patency    ThrombusCompressibleSpontaneous Phasic Augmented +----------+--------------+--------+------------+------------+-------+---------+ IJV           patent                yes     Spontaneous.Phasic.           +----------+--------------+--------+------------+------------+-------+---------+ Subclavian    patent                        Spontaneous.Phasic.          +----------+--------------+--------+------------+------------+-------+---------+ Axillary      patent                        Spontaneous.Phasic.          +----------+--------------+--------+------------+------------+-------+---------+ Brachial      patent                yes     Spontaneous.Phasic.          +----------+--------------+--------+------------+------------+-------+---------+ Radial        patent                yes                                  +----------+--------------+--------+------------+------------+-------+---------+ Ulnar         patent                yes                                  +----------+--------------+--------+------------+------------+-------+---------+ Cephalic    partially                no  occluded                                                    +----------+--------------+--------+------------+------------+-------+---------+ Basilic       patent                yes                                  +----------+--------------+--------+------------+------------+-------+---------+  Final Interpretation: No evidence of deep vein thrombosis involving the right and left upper extremities. Bilateral superficial thrombosis noted in the cephalic veins.  *See table(s) above for measurements and observations.  Deitra Mayo Electronically signed by Deitra Mayo on 03/03/2017 at 3:32:00 PM.  ** Final **       Scheduled Meds: . feeding supplement  1 Container Oral BID BM  . feeding supplement (ENSURE ENLIVE)  237 mL Oral Q24H  . finasteride  5 mg Oral Daily  . fluticasone  1 spray Each Nare Daily  . heparin injection (subcutaneous)  5,000 Units Subcutaneous Q8H  . ipratropium-albuterol  3 mL  Nebulization TID  . lip balm      . metoprolol tartrate  75 mg Oral BID  . multivitamin with minerals  1 tablet Oral Daily  . pantoprazole  40 mg Oral Daily  . sodium chloride flush  3 mL Intravenous Q12H  . sodium chloride flush      . tamsulosin  0.4 mg Oral Daily   Continuous Infusions: . sodium chloride    . sodium chloride    . acetaminophen    . azithromycin Stopped (03/03/17 1900)  . ceFEPime (MAXIPIME) IV 0 g (03/03/17 2207)  . sodium chloride       LOS: 4 days    Time spent: over 20 min    Fayrene Helper, MD [facility] Pager [phone]  If 7PM-7AM, please contact night-coverage [email or web address] Password [facility] 03/04/2017, 12:09 PM

## 2017-03-04 NOTE — Progress Notes (Signed)
Labetolol given slow IV spush.  Heart rate slowing to 110's.  Respiratory rate 28-36/min.  Still rapid but not quite as labored.  B/p now 97/49.  Dr. Marcello Moores in to evaluate patient.Temp down to 98.1 axillary.

## 2017-03-04 NOTE — Progress Notes (Signed)
Subjective No acute events.   Objective: Vital signs in last 24 hours: Temp:  [97.7 F (36.5 C)-101.1 F (38.4 C)] 98.1 F (36.7 C) (02/18 0939) Pulse Rate:  [94-139] 118 (02/18 0941) Resp:  [20-37] 33 (02/18 0941) BP: (96-127)/(49-67) 100/50 (02/18 0939) SpO2:  [86 %-100 %] 96 % (02/18 0941) Weight:  [97.2 kg (214 lb 4.6 oz)] 97.2 kg (214 lb 4.6 oz) (02/18 0454) Last BM Date: 03/03/17  Intake/Output from previous day: 02/17 0701 - 02/18 0700 In: 0865 [P.O.:1140; I.V.:3; IV Piggyback:300] Out: -  Intake/Output this shift: No intake/output data recorded.  Gen: NAD, comfortable CV: RRR Pulm: Normal work of breathing Abd: Soft, NT/ND; L groin with bandaid from IR bx. Palpable lymphadenopathy. Axilla: No significant palpable lymphadenopathy with arm in raised position Ext: SCDs in place  Lab Results: CBC  Recent Labs    03/03/17 0353 03/04/17 0407  WBC 13.1* 12.3*  HGB 11.7* 11.5*  HCT 34.6* 35.4*  PLT 191 200   BMET Recent Labs    03/03/17 0353 03/04/17 0407  NA 134* 136  K 4.0 4.0  CL 105 107  CO2 21* 21*  GLUCOSE 100* 90  BUN 26* 32*  CREATININE 1.72* 2.03*  CALCIUM 8.7* 8.6*    Anti-infectives: Anti-infectives (From admission, onward)   Start     Dose/Rate Route Frequency Ordered Stop   03/01/17 1300  [MAR Hold]  ceFEPIme (MAXIPIME) 1 g in sodium chloride 0.9 % 100 mL IVPB     (MAR Hold since 03/04/17 0824)   1 g 200 mL/hr over 30 Minutes Intravenous 2 times daily 03/01/17 1202 03/11/17 0959   02/28/17 1800  cefTRIAXone (ROCEPHIN) 1 g in sodium chloride 0.9 % 100 mL IVPB  Status:  Discontinued     1 g 200 mL/hr over 30 Minutes Intravenous Every 24 hours 02/27/17 2205 03/01/17 1151   02/28/17 1800  [MAR Hold]  azithromycin (ZITHROMAX) 500 mg in sodium chloride 0.9 % 250 mL IVPB     (MAR Hold since 03/04/17 0824)   500 mg 250 mL/hr over 60 Minutes Intravenous Every 24 hours 02/27/17 2205 03/07/17 1759   02/27/17 1824  azithromycin (ZITHROMAX) 500 MG  injection    Comments:  Peel, Adrienne   : cabinet override      02/27/17 1824 02/28/17 0629   02/27/17 1800  cefTRIAXone (ROCEPHIN) 1 g in sodium chloride 0.9 % 100 mL IVPB     1 g 200 mL/hr over 30 Minutes Intravenous  Once 02/27/17 1757 02/27/17 1905   02/27/17 1800  azithromycin (ZITHROMAX) 500 mg in sodium chloride 0.9 % 250 mL IVPB     500 mg 250 mL/hr over 60 Minutes Intravenous  Once 02/27/17 1757 02/27/17 2034       Assessment/Plan: Patient Active Problem List   Diagnosis Date Noted  . Generalized lymphadenopathy   . Pneumonia 02/28/2017  . PNA (pneumonia) 02/27/2017  . Rheumatoid arthritis of multiple sites with negative rheumatoid factor ([place]) 01/30/2016  . High risk medication use 01/30/2016  . History of total knee replacement, right 01/30/2016  . Primary osteoarthritis of both hands 01/30/2016  . Primary osteoarthritis of both feet 01/30/2016  . DDD C-spine 01/30/2016  . Osteoarthritis of lumbar spine 01/30/2016  . History of IBS 01/30/2016  . Hyperlipidemia   . DJD (degenerative joint disease) of knee   . Allergic rhinitis, seasonal   . BPH (benign prostatic hyperplasia)   . Hypertension   . Arthritis   . GERD (gastroesophageal reflux disease)   .  CKD (chronic kidney disease) stage 3, GFR 30-59 ml/min (HCC)   . Polyp of colon, adenomatous 07/15/2009   Multiple enlarged lymph nodes in the groins - particularly the left groin. Nothing too convincingly palpable in his axilla. Would plan left groin lymph node excision for biopsy.  -The anatomy and physiology of the groin was discussed with the patient and his daughter.  Pathophysiology of his condition was elaborated and the need for additional tissue was discussed.  We have been requested to obtain excisional biopsy of a lymph node for further pathologic assessment of his condition.  The planned procedure, material risks (including, but not limited to, pain, bleeding, infection, need for additional tissue and/or  procedures, hematoma, seroma, nondiagnostic study, worsening of his pulmonary condition including the need for intubation/prolonged intubation) benefits and alternatives were discussed.  He and his daughter's questions were answered to their satisfaction and they elected to proceed with the procedure   LOS: 4 days  Rosario Adie, MD  [facility]

## 2017-03-04 NOTE — Anesthesia Postprocedure Evaluation (Signed)
Anesthesia Post Note  Patient: Jose Meza  Procedure(s) Performed: INGUINAL LYMPH NODE BIOPSY LEFT (Bilateral )     Patient location during evaluation: PACU Anesthesia Type: MAC Level of consciousness: awake and alert Pain management: pain level controlled Vital Signs Assessment: post-procedure vital signs reviewed and stable Respiratory status: spontaneous breathing, nonlabored ventilation, respiratory function stable and patient connected to nasal cannula oxygen Cardiovascular status: stable and blood pressure returned to baseline Postop Assessment: no apparent nausea or vomiting Anesthetic complications: no    Last Vitals:  Vitals:   03/04/17 1100 03/04/17 1122  BP: (!) 90/54 (!) 92/50  Pulse: (!) 119 (!) 118  Resp: (!) 26   Temp: 37.1 C 36.8 C  SpO2: 97% 97%    Last Pain:  Vitals:   03/04/17 0441  TempSrc: Axillary  PainSc:                  [name]

## 2017-03-04 NOTE — Transfer of Care (Signed)
Immediate Anesthesia Transfer of Care Note  Patient: Jose Meza  Procedure(s) Performed: Procedure(s): INGUINAL LYMPH NODE BIOPSY LEFT (Bilateral)  Patient Location: PACU  Anesthesia Type:MAC  Level of Consciousness: Patient easily awoken, sedated, comfortable, cooperative, following commands, responds to stimulation.   Airway & Oxygen Therapy: Patient spontaneously breathing, ventilating well, oxygen via simple oxygen mask.  Post-op Assessment: Report given to PACU RN, vital signs reviewed and stable, moving all extremities.   Post vital signs: Reviewed and stable.  Complications: No apparent anesthesia complications Last Vitals:  Vitals:   03/04/17 0940 03/04/17 0941  BP:    Pulse: (!) 116 (!) 118  Resp: (!) 33 (!) 33  Temp:    SpO2: 95% 96%    Last Pain:  Vitals:   03/04/17 0441  TempSrc: Axillary  PainSc:       Patients Stated Pain Goal: 0 (93/81/01 7510)  Complications: No apparent anesthesia complications

## 2017-03-04 NOTE — Progress Notes (Signed)
Patient received to Short Stay to prep for surgery today.  Found to be tachycardic and tachypneic.  HR  140. RR 36.  Placed on cardiac monitor,  Shows sinus tachycardia.        On nasal cannula at 4l/min.  With pulse oximeter readings 90-94%.  Dr. Smith Robert , anesthesiologist notified., in to see patient . 12 lead EKG performed . 500cc bolus of normal saline started. Temp 101.0 axillary,  Tylenol 100mg  started IV.  Xopenex nebulizer 1.25% given via face mask.Patient c/o right forearm IV is tender.  This IV site d/c'ed.  New IV started in left forearm 18ga.

## 2017-03-04 NOTE — Anesthesia Procedure Notes (Signed)
Procedure Name: MAC Date/Time: 03/04/2017 9:55 AM Performed by: Deliah Boston, CRNA Pre-anesthesia Checklist: Patient identified, Emergency Drugs available, Suction available, Patient being monitored and Timeout performed Patient Re-evaluated:Patient Re-evaluated prior to induction Oxygen Delivery Method: Simple face mask Placement Confirmation: positive ETCO2,  CO2 detector and breath sounds checked- equal and bilateral

## 2017-03-04 NOTE — Progress Notes (Signed)
Physical Therapy Treatment Patient Details Name: Jose Meza MRN: [id number] DOB: [birth year] Today's Date: 03/04/2017    History of Present Illness  84 y.o. male with medical history significant of chronic kidney disease, rheumatoid arthritis chronically on steroids, hypertension transferred here from [facility] after finding diffuse disease in his chest concerning for lymphoma.    PT Comments    Pt had lymph node biopsy this morning. He reported he was too fatigued from the procedure to get out of bed this afternoon. Pt agreed to bed level exercises. Performed BLE exercises with manual resistance and issued pt theraband for BUE strengthening exercises. HR 113, SaO2 95% on 4L O2 with exercise.    Follow Up Recommendations  Home health PT     Equipment Recommendations  None recommended by PT    Recommendations for Other Services       Precautions / Restrictions Precautions Precautions: Fall Precaution Comments: denies falls in past 1 year Restrictions Weight Bearing Restrictions: No    Mobility  Bed Mobility               General bed mobility comments: pt declined mobility, he "feels empty" 2* biopsy this morning  Transfers                    Ambulation/Gait                 Stairs            Wheelchair Mobility    Modified Rankin (Stroke Patients Only)       Balance                                            Cognition Arousal/Alertness: Awake/alert Behavior During Therapy: WFL for tasks assessed/performed Overall Cognitive Status: Within Functional Limits for tasks assessed                                        Exercises General Exercises - Lower Extremity Ankle Circles/Pumps: AROM;15 reps;Both;Supine Short Arc Quad: AROM;Both;10 reps;Supine(with manual resistance) Heel Slides: AAROM;Both;10 reps;Supine(with manual resistance) Hip ABduction/ADduction: Both;5 reps(in hook lying,  manually resisted isometric hip ABD/ADDuction) Straight Leg Raises: AROM;Both;10 reps;Supine Other Exercises Other Exercises: bridging x 5     General Comments        Pertinent Vitals/Pain Pain Assessment: No/denies pain    Home Living                      Prior Function            PT Goals (current goals can now be found in the care plan section) Acute Rehab PT Goals Patient Stated Goal: play bridge PT Goal Formulation: With patient/family Time For Goal Achievement: 03/15/17 Potential to Achieve Goals: Good Progress towards PT goals: Not progressing toward goals - comment(fatigue limited activity tolerance today)    Frequency    Min 3X/week      PT Plan Current plan remains appropriate    Co-evaluation              AM-PAC PT "6 Clicks" Daily Activity  Outcome Measure  Difficulty turning over in bed (including adjusting bedclothes, sheets and blankets)?: A Little Difficulty moving from lying on back to sitting on the  side of the bed? : A Little Difficulty sitting down on and standing up from a chair with arms (e.g., wheelchair, bedside commode, etc,.)?: A Little Help needed moving to and from a bed to chair (including a wheelchair)?: A Little Help needed walking in hospital room?: A Little Help needed climbing 3-5 steps with a railing? : A Lot 6 Click Score: 17    End of Session Equipment Utilized During Treatment: Gait belt Activity Tolerance: Patient limited by fatigue Patient left: in bed;with call bell/phone within reach;with family/visitor present Nurse Communication: Mobility status;Other (comment)(ambulating O2 sats 70% room air) PT Visit Diagnosis: Difficulty in walking, not elsewhere classified (R26.2)     Time: 8887-5797 PT Time Calculation (min) (ACUTE ONLY): 21 min  Charges:  $Therapeutic Exercise: 8-22 mins                    G Codes:          Philomena Doheny 03/04/2017, 3:10 PM [phone]

## 2017-03-04 NOTE — Anesthesia Preprocedure Evaluation (Addendum)
Anesthesia Evaluation  Patient identified by MRN, date of birth, ID band Patient awake    Reviewed: Allergy & Precautions, NPO status , Patient's Chart, lab work & pertinent test results  Airway Mallampati: II  TM Distance: >3 FB Neck ROM: Full    Dental  (+) Teeth Intact, Dental Advisory Given   Pulmonary former smoker,   Rapid Shallow Breathing   + decreased breath sounds      Cardiovascular hypertension, Pt. on home beta blockers + dysrhythmias  Rhythm:Regular Rate:Tachycardia     Neuro/Psych  Neuromuscular disease negative psych ROS   GI/Hepatic Neg liver ROS, GERD  Medicated,  Endo/Other  negative endocrine ROS  Renal/GU Renal disease     Musculoskeletal  (+) Arthritis , Rheumatoid disorders,    Abdominal   Peds  Hematology negative hematology ROS (+)   Anesthesia Other Findings   Reproductive/Obstetrics                           Lab Results  Component Value Date   WBC 12.3 (H) 03/04/2017   HGB 11.5 (L) 03/04/2017   HCT 35.4 (L) 03/04/2017   MCV 98.9 03/04/2017   PLT 200 03/04/2017   Lab Results  Component Value Date   CREATININE 2.03 (H) 03/04/2017   BUN 32 (H) 03/04/2017   NA 136 03/04/2017   K 4.0 03/04/2017   CL 107 03/04/2017   CO2 21 (L) 03/04/2017   Lab Results  Component Value Date   INR 1.28 02/28/2017   Echo: Left ventricle: The cavity size was normal. There was moderate   concentric hypertrophy. Systolic function was vigorous. The   estimated ejection fraction was in the range of 65% to 70%. - Aortic valve: Calcified with mild to moderate stenosis and   trivial regurgitation. Mean gradient (S): 13 mm Hg. Peak gradient (S): 23 mm Hg. Valve area (VTI): 1.56 cm^2. Valve area (Vmax): 1.56 cm^2. Valve area (Vmean): 1.6 cm^2. - Aorta: Dilated aortic root. Aortic root dimension: 41 mm (ED). - Mitral valve: Mildly thickened leaflets . There was trivial  regurgitation. - Left atrium: The atrium was mildly dilated. - Tricuspid valve: There was mild regurgitation. - Pulmonary arteries: PA peak pressure: 54 mm Hg (S). - Inferior vena cava: The vessel was dilated. The respirophasic   diameter changes were blunted (< 50%), consistent with elevated   central venous pressure. - Pericardium, extracardiac: A trivial pericardial effusion was   identified posterior to the heart.  Anesthesia Physical Anesthesia Plan  ASA: III  Anesthesia Plan: MAC   Post-op Pain Management:    Induction: Intravenous  PONV Risk Score and Plan: 2 and Propofol infusion  Airway Management Planned: Natural Airway and Simple Face Mask  Additional Equipment: None  Intra-op Plan:   Post-operative Plan:   Informed Consent: I have reviewed the patients History and Physical, chart, labs and discussed the procedure including the risks, benefits and alternatives for the proposed anesthesia with the patient or authorized representative who has indicated his/her understanding and acceptance.   Dental advisory given  Plan Discussed with: CRNA  Anesthesia Plan Comments: (Pt prefers local with minimal sedation.   Breathing tx, Abx, Acetaminophen and B blocker administered in pre op in attempt to optimize. Risks of anesthesia discussed in detail with patient and surgeon. )      Anesthesia Quick Evaluation

## 2017-03-05 ENCOUNTER — Inpatient Hospital Stay (HOSPITAL_COMMUNITY): Payer: Medicare HMO

## 2017-03-05 DIAGNOSIS — I70209 Unspecified atherosclerosis of native arteries of extremities, unspecified extremity: Secondary | ICD-10-CM | POA: Diagnosis present

## 2017-03-05 DIAGNOSIS — Z87891 Personal history of nicotine dependence: Secondary | ICD-10-CM

## 2017-03-05 DIAGNOSIS — R509 Fever, unspecified: Secondary | ICD-10-CM | POA: Diagnosis present

## 2017-03-05 DIAGNOSIS — Z882 Allergy status to sulfonamides status: Secondary | ICD-10-CM

## 2017-03-05 DIAGNOSIS — R634 Abnormal weight loss: Secondary | ICD-10-CM | POA: Diagnosis present

## 2017-03-05 DIAGNOSIS — R5383 Other fatigue: Secondary | ICD-10-CM

## 2017-03-05 DIAGNOSIS — R05 Cough: Secondary | ICD-10-CM

## 2017-03-05 DIAGNOSIS — R918 Other nonspecific abnormal finding of lung field: Secondary | ICD-10-CM

## 2017-03-05 DIAGNOSIS — R0603 Acute respiratory distress: Secondary | ICD-10-CM

## 2017-03-05 DIAGNOSIS — R21 Rash and other nonspecific skin eruption: Secondary | ICD-10-CM

## 2017-03-05 DIAGNOSIS — R0981 Nasal congestion: Secondary | ICD-10-CM

## 2017-03-05 DIAGNOSIS — R319 Hematuria, unspecified: Secondary | ICD-10-CM

## 2017-03-05 DIAGNOSIS — M0609 Rheumatoid arthritis without rheumatoid factor, multiple sites: Secondary | ICD-10-CM

## 2017-03-05 DIAGNOSIS — R5381 Other malaise: Secondary | ICD-10-CM

## 2017-03-05 DIAGNOSIS — R61 Generalized hyperhidrosis: Secondary | ICD-10-CM

## 2017-03-05 DIAGNOSIS — R591 Generalized enlarged lymph nodes: Secondary | ICD-10-CM

## 2017-03-05 LAB — COMPREHENSIVE METABOLIC PANEL
ALT: 17 U/L (ref 17–63)
AST: 20 U/L (ref 15–41)
Albumin: 1.9 g/dL — ABNORMAL LOW (ref 3.5–5.0)
Alkaline Phosphatase: 44 U/L (ref 38–126)
Anion gap: 9 (ref 5–15)
BUN: 35 mg/dL — AB (ref 6–20)
CALCIUM: 8.3 mg/dL — AB (ref 8.9–10.3)
CHLORIDE: 108 mmol/L (ref 101–111)
CO2: 19 mmol/L — AB (ref 22–32)
CREATININE: 2.02 mg/dL — AB (ref 0.61–1.24)
GFR calc Af Amer: 33 mL/min — ABNORMAL LOW (ref >60)
GFR, EST NON AFRICAN AMERICAN: 29 mL/min — AB (ref >60)
Glucose, Bld: 91 mg/dL (ref 65–99)
Potassium: 4 mmol/L (ref 3.5–5.1)
Sodium: 136 mmol/L (ref 135–145)
Total Bilirubin: 0.5 mg/dL (ref 0.3–1.2)
Total Protein: 6 g/dL — ABNORMAL LOW (ref 6.5–8.1)

## 2017-03-05 LAB — CBC
HCT: 32.6 % — ABNORMAL LOW (ref 39.0–52.0)
HEMOGLOBIN: 10.9 g/dL — AB (ref 13.0–17.0)
MCH: 33 pg (ref 26.0–34.0)
MCHC: 33.4 g/dL (ref 30.0–36.0)
MCV: 98.8 fL (ref 78.0–100.0)
PLATELETS: 175 10*3/uL (ref 150–400)
RBC: 3.3 MIL/uL — AB (ref 4.22–5.81)
RDW: 16.6 % — ABNORMAL HIGH (ref 11.5–15.5)
WBC: 13.3 10*3/uL — ABNORMAL HIGH (ref 4.0–10.5)

## 2017-03-05 LAB — MULTIPLE MYELOMA PANEL, SERUM
ALBUMIN/GLOB SERPL: 0.6 — AB (ref 0.7–1.7)
Albumin SerPl Elph-Mcnc: 2.3 g/dL — ABNORMAL LOW (ref 2.9–4.4)
Alpha 1: 0.3 g/dL (ref 0.0–0.4)
Alpha2 Glob SerPl Elph-Mcnc: 0.8 g/dL (ref 0.4–1.0)
B-Globulin SerPl Elph-Mcnc: 0.6 g/dL — ABNORMAL LOW (ref 0.7–1.3)
Gamma Glob SerPl Elph-Mcnc: 2.2 g/dL — ABNORMAL HIGH (ref 0.4–1.8)
Globulin, Total: 3.9 g/dL (ref 2.2–3.9)
IGA: 266 mg/dL (ref 61–437)
IGM (IMMUNOGLOBULIN M), SRM: 943 mg/dL — AB (ref 15–143)
IgG (Immunoglobin G), Serum: 1485 mg/dL (ref 700–1600)
M Protein SerPl Elph-Mcnc: 0.4 g/dL — ABNORMAL HIGH
Total Protein ELP: 6.2 g/dL (ref 6.0–8.5)

## 2017-03-05 LAB — MAGNESIUM: MAGNESIUM: 2.1 mg/dL (ref 1.7–2.4)

## 2017-03-05 MED ORDER — AZITHROMYCIN 250 MG PO TABS
500.0000 mg | ORAL_TABLET | Freq: Every day | ORAL | Status: AC
  Administered 2017-03-05: 500 mg via ORAL
  Filled 2017-03-05: qty 2

## 2017-03-05 MED ORDER — DEXTROSE-NACL 5-0.9 % IV SOLN
INTRAVENOUS | Status: AC
  Administered 2017-03-05 – 2017-03-06 (×3): via INTRAVENOUS

## 2017-03-05 MED ORDER — PREDNISONE 50 MG PO TABS
50.0000 mg | ORAL_TABLET | Freq: Every day | ORAL | Status: DC
  Administered 2017-03-06: 50 mg via ORAL
  Filled 2017-03-05: qty 1

## 2017-03-05 MED ORDER — METHYLPREDNISOLONE SODIUM SUCC 125 MG IJ SOLR
60.0000 mg | Freq: Once | INTRAMUSCULAR | Status: AC
  Administered 2017-03-05: 60 mg via INTRAVENOUS
  Filled 2017-03-05: qty 2

## 2017-03-05 NOTE — Progress Notes (Addendum)
Physical Therapy Treatment Patient Details Name: Jose Meza MRN: [id number] DOB: [birth year] Today's Date: 03/05/2017    History of Present Illness  84 y.o. male with medical history significant of chronic kidney disease, rheumatoid arthritis chronically on steroids, hypertension transferred here from [facility] after finding diffuse disease in his chest concerning for lymphoma.    PT Comments    Pt sat at edge of bed x 10 minutes and performed LE strengthening exercises. SaO2 85-92% on 2L O2, 3/4 dyspnea, HR 115. Pt declined transfers 2* SOB. Encouraged pt to sit at edge of bed TID.    Follow Up Recommendations  SNF- may need SNF if family not able to provide 24* care     Equipment Recommendations  None recommended by PT    Recommendations for Other Services       Precautions / Restrictions Precautions Precautions: Fall Precaution Comments: denies falls in past 1 year Restrictions Weight Bearing Restrictions: No    Mobility  Bed Mobility Overal bed mobility: Needs Assistance Bed Mobility: Supine to Sit;Sit to Supine     Supine to sit: Modified independent (Device/Increase time) Sit to supine: Min assist   General bed mobility comments: min A for BLEs into bed; pt sat at edge of bed x 10 minutes, SaO2 85%-92% on 2L O2 in sitting  Transfers                 General transfer comment: pt declined 2* SOB/fatigue with sitting at edge of bed  Ambulation/Gait                 Stairs            Wheelchair Mobility    Modified Rankin (Stroke Patients Only)       Balance Overall balance assessment: Independent(sitting at edge of bed with feet supported)                                          Cognition Arousal/Alertness: Awake/alert Behavior During Therapy: WFL for tasks assessed/performed Overall Cognitive Status: Within Functional Limits for tasks assessed                                         Exercises General Exercises - Lower Extremity Long Arc Quad: AROM;Both;10 reps(with manual resistance)    General Comments        Pertinent Vitals/Pain Pain Assessment: No/denies pain    Home Living                      Prior Function            PT Goals (current goals can now be found in the care plan section) Acute Rehab PT Goals Patient Stated Goal: play bridge PT Goal Formulation: With patient/family Time For Goal Achievement: 03/15/17 Potential to Achieve Goals: Good Progress towards PT goals: Progressing toward goals    Frequency    Min 3X/week      PT Plan Discharge plan needs to be updated    Co-evaluation              AM-PAC PT "6 Clicks" Daily Activity  Outcome Measure  Difficulty turning over in bed (including adjusting bedclothes, sheets and blankets)?: A Little Difficulty moving from lying on back to  sitting on the side of the bed? : A Little Difficulty sitting down on and standing up from a chair with arms (e.g., wheelchair, bedside commode, etc,.)?: A Little Help needed moving to and from a bed to chair (including a wheelchair)?: A Lot Help needed walking in hospital room?: A Lot Help needed climbing 3-5 steps with a railing? : A Lot 6 Click Score: 15    End of Session   Activity Tolerance: Patient limited by fatigue Patient left: in bed;with call bell/phone within reach;with family/visitor present Nurse Communication: Mobility status PT Visit Diagnosis: Difficulty in walking, not elsewhere classified (R26.2)     Time: 5072-2575 PT Time Calculation (min) (ACUTE ONLY): 20 min  Charges:  $Therapeutic Activity: 8-22 mins                    G Codes:          Philomena Doheny 03/05/2017, 3:31 PM [phone]

## 2017-03-05 NOTE — Progress Notes (Signed)
PT Cancellation Note  Patient Details Name: Jose Meza MRN: [id number] DOB: 02/25/1931   Cancelled Treatment:    Reason Eval/Treat Not Completed: Other (comment)(pt has family visiting, he requested I try back later. Will follow. )   Philomena Doheny 03/05/2017, 10:52 AM [phone]

## 2017-03-05 NOTE — Progress Notes (Signed)
1 Day Post-Op    CC:  SOB  Subjective: Whole family in room, all are very apprehensive.  He seems stable, but RR is up, he notes increasing rash, chest and arms.  Objective: Vital signs in last 24 hours: Temp:  [96.9 F (36.1 C)-101.1 F (38.4 C)] 98 F (36.7 C) (02/19 0445) Pulse Rate:  [95-138] 122 (02/19 0825) Resp:  [16-37] 16 (02/19 0445) BP: (90-131)/(49-62) 131/58 (02/19 0445) SpO2:  [86 %-97 %] 93 % (02/19 0835) Weight:  [97.4 kg (214 lb 11.7 oz)] 97.4 kg (214 lb 11.7 oz) (02/19 0445) Last BM Date: 03/03/17 1800 IV  Nothing po recorded 350 urine Stool x 2 Afebrile, VSS Creatinine 2.02  Intake/Output from previous day: 02/18 0701 - 02/19 0700 In: 1800 [I.V.:800] Out: 355 [Urine:350; Blood:5] Intake/Output this shift: No intake/output data recorded.  General appearance: alert and cooperative Skin: Left lymphnode bx site looks good. Glue, no erythema or swelling.   Lab Results:  Recent Labs    03/04/17 0407 03/05/17 0348  WBC 12.3* 13.3*  HGB 11.5* 10.9*  HCT 35.4* 32.6*  PLT 200 175    BMET Recent Labs    03/04/17 0407 03/05/17 0348  NA 136 136  K 4.0 4.0  CL 107 108  CO2 21* 19*  GLUCOSE 90 91  BUN 32* 35*  CREATININE 2.03* 2.02*  CALCIUM 8.6* 8.3*   PT/INR No results for input(s): LABPROT, INR in the last 72 hours.  Recent Labs  Lab 02/27/17 1514 03/04/17 0407 03/05/17 0348  AST 29 20 20   ALT 25 17 17   ALKPHOS 59 48 44  BILITOT 0.4 0.5 0.5  PROT 7.3 6.5 6.0*  ALBUMIN 2.7* 2.1* 1.9*     Lipase     Component Value Date/Time   LIPASE 46 02/27/2017 1514     Medications: . dronabinol  5 mg Oral BID AC  . feeding supplement  1 Container Oral BID BM  . feeding supplement (ENSURE ENLIVE)  237 mL Oral Q24H  . finasteride  5 mg Oral Daily  . fluticasone  1 spray Each Nare Daily  . heparin injection (subcutaneous)  5,000 Units Subcutaneous Q8H  . levalbuterol  0.63 mg Nebulization TID  . metoprolol tartrate  75 mg Oral BID  .  multivitamin with minerals  1 tablet Oral Daily  . pantoprazole  40 mg Oral Daily  . sodium chloride flush  3 mL Intravenous Q12H  . tamsulosin  0.4 mg Oral Daily   . sodium chloride    . sodium chloride 100 mL/hr at 03/05/17 0603  . azithromycin Stopped (03/04/17 1947)  . ceFEPime (MAXIPIME) IV Stopped (03/05/17 0038)   Anti-infectives (From admission, onward)   Start     Dose/Rate Route Frequency Ordered Stop   03/01/17 1300  ceFEPIme (MAXIPIME) 1 g in sodium chloride 0.9 % 100 mL IVPB     1 g 200 mL/hr over 30 Minutes Intravenous 2 times daily 03/01/17 1202 03/11/17 0959   02/28/17 1800  cefTRIAXone (ROCEPHIN) 1 g in sodium chloride 0.9 % 100 mL IVPB  Status:  Discontinued     1 g 200 mL/hr over 30 Minutes Intravenous Every 24 hours 02/27/17 2205 03/01/17 1151   02/28/17 1800  azithromycin (ZITHROMAX) 500 mg in sodium chloride 0.9 % 250 mL IVPB     500 mg 250 mL/hr over 60 Minutes Intravenous Every 24 hours 02/27/17 2205 03/07/17 1759   02/27/17 1824  azithromycin (ZITHROMAX) 500 MG injection    Comments:  Peel,  Adrienne   : cabinet override      02/27/17 1824 02/28/17 0629   02/27/17 1800  cefTRIAXone (ROCEPHIN) 1 g in sodium chloride 0.9 % 100 mL IVPB     1 g 200 mL/hr over 30 Minutes Intravenous  Once 02/27/17 1757 02/27/17 1905   02/27/17 1800  azithromycin (ZITHROMAX) 500 mg in sodium chloride 0.9 % 250 mL IVPB     500 mg 250 mL/hr over 60 Minutes Intravenous  Once 02/27/17 1757 02/27/17 2034        Assessment/Plan  lymphadenopathy Left inguinal lymph node BS 03/04/17  Acute hypoxic respiratory failure Leukocytosis - 13.3 today Hypotension/tachycardia - stable Renal insuffiencey  RA Anemia  FEN:  IV fluids/regular diet ID:  Azithromycin/Maxipime DVT:  Heparin Foley:  None Follow up:  TBD  Plan:  He can shower and resume activities as tolerated.  He will not need to return to the office unless there is an issue.  Pathology from yesterday is not yet  available.    LOS: 5 days    Jose Meza 03/05/2017 [phone]

## 2017-03-05 NOTE — Consult Note (Signed)
[facility]    Date of Admission:  02/27/2017   Total days of antibiotics 7               Reason for Consult: Fever, pulmonary nodules and infiltrates    Referring Provider: Dr. Fayrene Helper  Assessment: He has had a very protracted illness with fever, chills, progressive respiratory distress and now generalized adenopathy.  I suspect that his findings on chest CT and respiratory symptoms are noninfectious and that is why he is not improving on empiric antibiotic therapy.  He and his daughter asked me if he could stop taking antibiotics now.  I am in agreement with that plan.  Plan: 1. Discontinue cefepime and azithromycin and observe off of antibiotics 2. Await results of lymph node biopsy 3. I will follow with you  Principal Problem:   Fever Active Problems:   Pulmonary nodules/lesions, multiple   Generalized lymphadenopathy   Unintentional weight loss   Rash   Hyperlipidemia   Hypertension   CKD (chronic kidney disease) stage 3, GFR 30-59 ml/min (HCC)   Rheumatoid arthritis of multiple sites with negative rheumatoid factor (HCC)   Atherosclerotic peripheral vascular disease (HCC)   Scheduled Meds: . dronabinol  5 mg Oral BID AC  . feeding supplement (ENSURE ENLIVE)  237 mL Oral Q24H  . finasteride  5 mg Oral Daily  . fluticasone  1 spray Each Nare Daily  . heparin injection (subcutaneous)  5,000 Units Subcutaneous Q8H  . levalbuterol  0.63 mg Nebulization TID  . metoprolol tartrate  75 mg Oral BID  . multivitamin with minerals  1 tablet Oral Daily  . pantoprazole  40 mg Oral Daily  . [START ON 03/06/2017] predniSONE  50 mg Oral Q breakfast  . sodium chloride flush  3 mL Intravenous Q12H  . tamsulosin  0.4 mg Oral Daily   Continuous Infusions: . sodium chloride    . ceFEPime (MAXIPIME) IV Stopped (03/05/17 1215)  . dextrose 5 % and 0.9% NaCl 125 mL/hr at 03/05/17 1715   PRN Meds:.sodium chloride, acetaminophen, albuterol,  alum & mag hydroxide-simeth, benzonatate, guaiFENesin, ibuprofen, LORazepam, sodium chloride, sodium chloride flush  HPI: Jose Meza is a 84 y.o. male with a history of rheumatoid arthritis.  He has been treated with [name] for the past 4 years.  Last September he developed a severe upper respiratory infection with sinus congestion, postnasal drip and severe bronchitis.  Initially this was treated symptomatically.  After it would not resolve he was treated with a brief course of prednisone.  He was evaluated by ENT and scheduled for sinus surgery.  He began having rigors several months ago.  He has had very poor appetite and has lost about 25 pounds unintentionally.  He has had worsening shortness of breath for at least one month.  This finally led to his admission on 02/27/2017.  CT scans revealed generalized lymphadenopathy, groundglass opacities and scattered pulmonary nodules in both lungs.  He has had intermittent low-grade fevers.  He also developed a rash about 1 month ago.  He was started on empiric ceftriaxone and azithromycin for possible community-acquired pneumonia.  His ceftriaxone was changed to cefepime 5 days ago.  His chest x-ray is read as showing no change but it actually looks like worsening infiltrates to my reading.  He says that he is feeling worse.  He had a needle biopsy of 1 of his lymph nodes which was indeterminate but worrisome for  lymphoma.  He underwent excisional inguinal lymph node biopsy yesterday.   Review of Systems: Review of Systems  Constitutional: Positive for chills, diaphoresis, fever, malaise/fatigue and weight loss.  HENT: Positive for congestion. Negative for sore throat.   Respiratory: Positive for cough and shortness of breath. Negative for sputum production and wheezing.   Cardiovascular: Negative for chest pain.  Gastrointestinal: Negative for abdominal pain, diarrhea, nausea and vomiting.  Genitourinary: Positive for hematuria. Negative for dysuria,  frequency and urgency.  Musculoskeletal: Positive for joint pain. Negative for myalgias.  Skin: Positive for itching and rash.  Neurological: Positive for weakness. Negative for headaches.    Past Medical History:  Diagnosis Date  . Allergic rhinitis, seasonal   . Arthritis    neg rheum eval  . BPH (benign prostatic hypertrophy)    recurrent prostatitis, hx BOO  . Cauda equina, syndrome   . Chronic kidney disease    stage 2-3  . DJD (degenerative joint disease) of knee    right knee, s/p knee replacement 12/06  . GERD (gastroesophageal reflux disease)   . Hyperlipidemia   . Hypertension   . IBS (irritable bowel syndrome)    constipation prone  . Incomplete RBBB    ECG 05/08/13  . Polyp of colon, adenomatous 07/2009   buccini  . Rheumatoid arthritis ([place])     Social History   Tobacco Use  . Smoking status: Former Smoker    Packs/day: 2.00    Years: 7.00    Pack years: 14.00    Types: Cigarettes    Last attempt to quit: 01/16/1956    Years since quitting: 61.1  . Smokeless tobacco: Never Used  Substance Use Topics  . Alcohol use: No    Frequency: Never  . Drug use: No    Family History  Problem Relation Age of Onset  . Colon cancer Mother    Allergies  Allergen Reactions  . Sulfa Antibiotics Other (See Comments)    Paradoxical effect. Patient states Sulfa does not work for him.    OBJECTIVE: Blood pressure (!) 100/58, pulse 99, temperature 97.8 F (36.6 C), temperature source Axillary, resp. rate 19, height 6\' 5"  (1.956 m), weight 216 lb 0.8 oz (98 kg), SpO2 94 %.  Physical Exam  Constitutional: He is oriented to person, place, and time.  He is resting quietly in bed.  He is covered in blankets.  His daughter Verdis Frederickson is visiting.  HENT:  Mouth/Throat: No oropharyngeal exudate.  He has very dry mouth.  Eyes: Conjunctivae are normal.  Neck: Neck supple.  Cardiovascular: Normal rate and regular rhythm.  No murmur heard. Pulmonary/Chest: Effort normal. He  has no wheezes. He has no rales.  Abdominal: Soft. He exhibits no distension. There is no tenderness.  Musculoskeletal: Normal range of motion. He exhibits no edema or tenderness.  Neurological: He is alert and oriented to person, place, and time.  Skin: Rash noted.  Diffuse, red maculopapular rash on trunk and extremities.  Psychiatric: Mood and affect normal.    Lab Results Lab Results  Component Value Date   WBC 13.3 (H) 03/05/2017   HGB 10.9 (L) 03/05/2017   HCT 32.6 (L) 03/05/2017   MCV 98.8 03/05/2017   PLT 175 03/05/2017    Lab Results  Component Value Date   CREATININE 2.02 (H) 03/05/2017   BUN 35 (H) 03/05/2017   NA 136 03/05/2017   K 4.0 03/05/2017   CL 108 03/05/2017   CO2 19 (L) 03/05/2017    Lab Results  Component Value Date   ALT 17 03/05/2017   AST 20 03/05/2017   ALKPHOS 44 03/05/2017   BILITOT 0.5 03/05/2017     Microbiology: Recent Results (from the past 240 hour(s))  Blood culture (routine x 2)     Status: None   Collection Time: 02/27/17  6:00 PM  Result Value Ref Range Status   Specimen Description   Final    BLOOD BLOOD RIGHT HAND Performed at [facility], [address] [place], [place] [address]    Special Requests   Final    BOTTLES DRAWN AEROBIC AND ANAEROBIC Blood Culture adequate volume Performed at [facility], [address] [place], [place] [address]    Culture   Final    NO GROWTH 5 DAYS Performed at [facility], [place] [address] [place], [place] [address]    Report Status 03/04/2017 FINAL  Final  Blood culture (routine x 2)     Status: None   Collection Time: 02/27/17  6:15 PM  Result Value Ref Range Status   Specimen Description   Final    BLOOD LEFT ANTECUBITAL Performed at [facility], [address] [place], [place] [address]    Special Requests   Final    BOTTLES DRAWN AEROBIC AND ANAEROBIC Blood Culture adequate volume Performed at [facility], [address] [place], [place] [address]    Culture   Final    NO GROWTH 5 DAYS Performed at [facility], [place] [address] [place], [place] [address]    Report Status 03/04/2017 FINAL  Final  Urine culture     Status: Abnormal   Collection Time: 02/27/17  7:40 PM  Result Value Ref Range Status   Specimen Description   Final    URINE, RANDOM Performed at [facility], [address] [place], [place] [address]    Special Requests   Final    NONE Performed at [facility], [address] [place], [place] [address]    Culture >=100,000 COLONIES/mL KLEBSIELLA PNEUMONIAE (A)  Final   Report Status 03/02/2017 FINAL  Final   Organism ID, Bacteria KLEBSIELLA PNEUMONIAE (A)  Final      Susceptibility   Klebsiella pneumoniae - MIC*    AMPICILLIN >=32 RESISTANT Resistant     CEFAZOLIN <=4 SENSITIVE Sensitive     CEFTRIAXONE <=1 SENSITIVE Sensitive     CIPROFLOXACIN <=0.25 SENSITIVE Sensitive     GENTAMICIN <=1 SENSITIVE Sensitive     IMIPENEM <=0.25 SENSITIVE Sensitive     NITROFURANTOIN 64 INTERMEDIATE Intermediate     TRIMETH/SULFA <=20 SENSITIVE Sensitive     AMPICILLIN/SULBACTAM 8 SENSITIVE Sensitive     PIP/TAZO <=4 SENSITIVE Sensitive     Extended ESBL NEGATIVE Sensitive     * >=100,000 COLONIES/mL KLEBSIELLA PNEUMONIAE  Respiratory Panel by PCR     Status: None   Collection Time: 02/28/17  4:14 PM  Result Value Ref Range Status   Adenovirus NOT DETECTED NOT DETECTED Final   Coronavirus 229E NOT DETECTED NOT DETECTED Final   Coronavirus HKU1 NOT DETECTED NOT DETECTED Final   Coronavirus NL63 NOT DETECTED NOT DETECTED Final   Coronavirus OC43 NOT DETECTED NOT DETECTED Final   Metapneumovirus NOT DETECTED NOT DETECTED Final   Rhinovirus / Enterovirus NOT DETECTED NOT DETECTED Final   Influenza A NOT DETECTED NOT DETECTED Final   Influenza B NOT DETECTED NOT DETECTED Final  Parainfluenza Virus 1 NOT DETECTED NOT DETECTED Final   Parainfluenza Virus 2 NOT  DETECTED NOT DETECTED Final   Parainfluenza Virus 3 NOT DETECTED NOT DETECTED Final   Parainfluenza Virus 4 NOT DETECTED NOT DETECTED Final   Respiratory Syncytial Virus NOT DETECTED NOT DETECTED Final   Bordetella pertussis NOT DETECTED NOT DETECTED Final   Chlamydophila pneumoniae NOT DETECTED NOT DETECTED Final   Mycoplasma pneumoniae NOT DETECTED NOT DETECTED Final    Comment: Performed at [facility], [place] [address], [place], [place] [address]  MRSA PCR Screening     Status: None   Collection Time: 03/01/17  9:40 AM  Result Value Ref Range Status   MRSA by PCR NEGATIVE NEGATIVE Final    Comment:        The GeneXpert MRSA Assay (FDA approved for NASAL specimens only), is one component of a comprehensive MRSA colonization surveillance program. It is not intended to diagnose MRSA infection nor to guide or monitor treatment for MRSA infections. Performed at [facility], [place] [address], [place], [place] [address]     Michel Bickers, [facility] [phone] pager   [phone] cell 03/05/2017, 5:21 PM

## 2017-03-05 NOTE — Progress Notes (Signed)
Calorie Count Note  48 hour calorie count ordered. Envelope placed on patient's door.  Staff to document percent consumed for each item on the patient's meal tray ticket and keep in envelope. Also document percent of any supplement or snack pt consumes and keep documentation in envelope for RD to review.   Clayton Bibles, MS, RD, [place] [facility] Pager: [phone] After Hours Pager: [phone]

## 2017-03-05 NOTE — Care Management Important Message (Signed)
Important Message  Patient Details  Name: Jose Meza MRN: [id number] Date of Birth: 03/06/1931   Medicare Important Message Given:  Yes    Kerin Salen 03/05/2017, 11:00 AMImportant Message  Patient Details  Name: Jose Meza MRN: [id number] Date of Birth: [birth year]   Medicare Important Message Given:  Yes    Kerin Salen 03/05/2017, 11:00 AM

## 2017-03-05 NOTE — Progress Notes (Signed)
PROGRESS NOTE    Jose Meza  SXJ:[id number] DOB: [birth year] DOA: 02/27/2017 PCP: Lajean Manes, MD   Brief Narrative:  Jose Meza is Jose Meza 84 y.o. male with medical history significant of chronic kidney disease, rheumatoid arthritis chronically on steroids, hypertension transferred here from [facility] after finding use disease in his chest concerning for lymphoma.  Patient arrived there with complaints of coughing for over Jose Meza week with chills and low-grade fevers at home.  Is lost over 20 pounds in the last 4-6 weeks.  He is recently been treated for prostatitis with an unknown antibiotic Jose Meza month ago.  His symptoms have persisted however despite recent steroid taper for bronchitis also.  He denies any nausea vomiting or diarrhea.  He has not noticed any swollen areas on his body lately.  He denies any rashes.  CTA was done which showed diffuse disease concerning for lymphoma with possible pneumonia.  Patient is referred for admission for hypoxia with 90% O2 sats on room air along with possible pneumonia.  He denies any pain.  Assessment & Plan:   Principal Problem:   PNA (pneumonia) Active Problems:   Hyperlipidemia   Hypertension   CKD (chronic kidney disease) stage 3, GFR 30-59 ml/min (HCC)   Rheumatoid arthritis of multiple sites with negative rheumatoid factor (HCC)   Pneumonia   Generalized lymphadenopathy  Acute Hypoxic Respiratory Failure:  Currently being treated for community acquired pneumonia, but patient also with widepread lymphadenopathy and pulmonary nodules and interlobular septal thickening throughout lungs which could be contributing.  Immunocompromised with treatment for RA on xeljanz, recently treated with steroids as well for past few days.  Of note, neg quant gold 10/2016 and denies risk factors for exposure.  CXR 2/17 with "mild interval worsening bilateral hazy perihilar opacification which may be due to worsening interstitial edema as infection is also  possible.  Known bilateral hilar adenopathy.  Small pleural effusions unchanged." Repeat CXR 2/19 without interval change in bilateral perihilar and LL airspace opacities and possible small layering effusion (edema vs infection) This morning with increased wob Consult infectious disease for assistance as not improving with IV abx Start steroids IV today and PO tomorrow as now s/p biopsy Duonebs, albuterol prn Ceftriaxone (2/13-2/15) and azithromycin (2/13 - )  Continue cefepime (2/15 - )  MRSA PCR negative Sputum cx pending, urine strep negative, urine legionella pending Negative influenza RVP negative PCT Demone Lyles bit elevated SLP eval - decreased to full liquid diet. SLP following.  In's outs, daily weights  Filed Weights   03/03/17 0411 03/04/17 0454 03/05/17 0445  Weight: 96 kg (211 lb 10.3 oz) 97.2 kg (214 lb 4.6 oz) 97.4 kg (214 lb 11.7 oz)   Diffuse Lymphadenopathy  Weight Loss  Fevers: imaging findings concerning for lymphoma per radiology.  He notes weight loss and low grade temps over past several months.  Denies night sweats.  Also on differential based on imaging findings is metastatic primary bronchogenic carcinoma.  2/14 LN core biopsy (atypical lymphoid proliferation) -> "overall histologic and immunophenotypic features atypical and concerning for Alira Fretwell lymphoproliferative process" S/p excisional LN biopsy 2/18, follow pathology  Follow multiple myeloma panel Follow EBV pcr and HIV ab Appreciate Dr. Lindi Adie recommendations (03/01/17 c/s note)  Rash: Papular rash to bilateral arms and diffuse macular rash over chest.  Blanching.  notes present prior to admission.  Possibly related to above.  Follow, started steroids as noted above.   Weight Loss  Poor PO intake: likely 2/2 above.  Daughter notes he's had very little since he's been admitted would like to start enteral or IV nutrition.  Will start calorie count.  Discussed placing and NG tube with Jose Meza to start tube feeds this  evening for Jose Meza, but he declines and prefers to wait until the biopsy results comes back so he knows what he's dealing with.    Nutrition c/s Calorie count SLP as above Marinol added for appetite stimulation  Upper Extremity Swelling  Superficial Thrombosis:  Follow dopplers, suspect 2/2 volume overload.  [place] with superficial thrombosis in bilateral cephalic veins, but negative for DVT.  Persistent today.  Continue to follow.   UTI  Hematuria: UA nitrite positive, bacteria, and 6-30 WBC, though with positive squams.  Follow cx.  Continue cefepime as above. Hx of prostatitis.  Ucx with klebsiella sensitive to cephalosporins (s/p 7 days abx for UTI). Needs outpatient f/u for hematuria   Hypotension  Tachycardia: Seemed to be worse 2/17-18 overnight and into the morning of the procedure.  He'd been diuresed the day before for concern for volume overload with dependent edema to upper extremities and CXR findings.  Continue IVF for now.    Continue metoprolol 75 mg BID.  TSH 6.45, would repeat outside of acute illness  Echo with EF 82-95%, grade 1 diastolic dysfunction (see report) Increase beta blocker as needed  Anemia: slightly downtrended, likely 2/2 IVF.  Continue to monitor. Stable.   Leukocytosis: 2/2 above, continue to monitor. stable  Rheumatoid Arthritis:  Hold xeljanz  AKI on CKD Stage III: baseline creatinine looks to be between 1.5-1.7. Increased to 2.03 in the setting of diuresis.  IVF resumed at this point.  Follow I/O, daily weight.  HTN: metoprolol  GERD: PPI  Anxiety: ativan bid prn, will increase this to 1 mg BID prn.  Stop remeron and hydroxyzine as these aren't helping.  Asking for something additional for sleep as well, but discussed we'll make 1 change with resp problems above.  Ectatic Infrarenal Abdominal Aorta: follow up as noted per rads  Deviated Septum: continue flonase, nasal saline.  ENT c/s per request.  Family Concerns: had long discussion  with daughter and patient this morning.  Concerned that results and plan were not being communicated well.  Also asking to be transferred to [facility] telemetry unit this morning.  Most pressing concern this morning was for ENT consult.  Will plan to transfer to telemetry bed (previously discussed him being on non tele unit, but at that time he preferred not to be transferred again).  Discussed with ENT as noted above.  Discussed that we're doing our best to keep them up to date and that we would continue to try to do so going forward.  Encouraged to continue to ask questions and share concerns.    DVT prophylaxis: heparin Code Status: full code Family Communication: discussed with daughter 2/119 Disposition Plan: pending improvement in resp status and bx results   Consultants:   IR  Procedures:   US Guided Biopsy of L inguinal LN 02/28/17  Excisional Biopsy of L inguinal LN 03/04/17  Antimicrobials:  Anti-infectives (From admission, onward)   Start     Dose/Rate Route Frequency Ordered Stop   03/05/17 1800  azithromycin (ZITHROMAX) tablet 500 mg     500 mg Oral Daily-1800 03/05/17 1057 03/06/17 1759   03/01/17 1300  ceFEPIme (MAXIPIME) 1 g in sodium chloride 0.9 % 100 mL IVPB     1 g 200 mL/hr over 30 Minutes Intravenous 2  times daily 03/01/17 1202 03/11/17 0959   02/28/17 1800  cefTRIAXone (ROCEPHIN) 1 g in sodium chloride 0.9 % 100 mL IVPB  Status:  Discontinued     1 g 200 mL/hr over 30 Minutes Intravenous Every 24 hours 02/27/17 2205 03/01/17 1151   02/28/17 1800  azithromycin (ZITHROMAX) 500 mg in sodium chloride 0.9 % 250 mL IVPB  Status:  Discontinued     500 mg 250 mL/hr over 60 Minutes Intravenous Every 24 hours 02/27/17 2205 03/05/17 1057   02/27/17 1824  azithromycin (ZITHROMAX) 500 MG injection    Comments:  Peel, Adrienne   : cabinet override      02/27/17 1824 02/28/17 0629   02/27/17 1800  cefTRIAXone (ROCEPHIN) 1 g in sodium chloride 0.9 % 100 mL IVPB     1 g 200 mL/hr  over 30 Minutes Intravenous  Once 02/27/17 1757 02/27/17 1905   02/27/17 1800  azithromycin (ZITHROMAX) 500 mg in sodium chloride 0.9 % 250 mL IVPB     500 mg 250 mL/hr over 60 Minutes Intravenous  Once 02/27/17 1757 02/27/17 2034     Subjective: C/o SOB.  Frustrated.   Objective: Vitals:   03/05/17 0445 03/05/17 0554 03/05/17 0825 03/05/17 0835  BP: (!) 131/58     Pulse: (!) 113  (!) 122   Resp: 16     Temp: 98 F (36.7 C)     TempSrc: Oral     SpO2: 95% 95% 94% 93%  Weight: 97.4 kg (214 lb 11.7 oz)     Height:        Intake/Output Summary (Last 24 hours) at 03/05/2017 1110 Last data filed at 03/05/2017 0446 Gross per 24 hour  Intake 1000 ml  Output 350 ml  Net 650 ml   Filed Weights   03/03/17 0411 03/04/17 0454 03/05/17 0445  Weight: 96 kg (211 lb 10.3 oz) 97.2 kg (214 lb 4.6 oz) 97.4 kg (214 lb 11.7 oz)    Examination:  General: Appears uncomfortable and SOB Cardiovascular: Heart sounds show Paisely Brick tachycardic rate, and rhythm. No gallops or rubs. No murmurs. No JVD. Lungs: Decreased breath sounds.  Some faint wheezing.   Abdomen: Soft, nontender, nondistended with normal active bowel sounds. No masses. No hepatosplenomegaly. Neurological: Alert and oriented 3. Moves all extremities 4 with equal strength. Cranial nerves II through XII grossly intact. Skin: maculopapular rash to upper extremities bilaterally, patchy erythematous rash over chest.  Blanching Extremities: No clubbing or cyanosis. No edema.  Psychiatric: Mood and affect are normal. Insight and judgment are appropriate.    Data Reviewed: I have personally reviewed following labs and imaging studies  CBC: Recent Labs  Lab 02/27/17 1514 02/28/17 0423 03/01/17 0431 03/02/17 0338 03/03/17 0353 03/04/17 0407 03/05/17 0348  WBC 11.6* 10.0 11.9* 11.1* 13.1* 12.3* 13.3*  NEUTROABS 9.0* 7.8*  --   --   --   --   --   HGB 12.2* 10.9* 11.4* 10.8* 11.7* 11.5* 10.9*  HCT 35.3* 32.1* 33.7* 32.3* 34.6* 35.4*  32.6*  MCV 96.7 98.2 98.8 98.2 98.3 98.9 98.8  PLT 207 217 202 208 191 200 831   Basic Metabolic Panel: Recent Labs  Lab 03/01/17 0431 03/02/17 0338 03/03/17 0353 03/04/17 0407 03/05/17 0348  NA 135 135 134* 136 136  K 4.2 3.9 4.0 4.0 4.0  CL 106 107 105 107 108  CO2 19* 19* 21* 21* 19*  GLUCOSE 107* 99 100* 90 91  BUN 29* 26* 26* 32* 35*  CREATININE 1.82*  1.63* 1.72* 2.03* 2.02*  CALCIUM 8.1* 8.4* 8.7* 8.6* 8.3*  MG 2.1 2.2 2.1 2.1 2.1   GFR: Estimated Creatinine Clearance: 34.9 mL/min (Ayush Boulet) (by C-G formula based on SCr of 2.02 mg/dL (H)). Liver Function Tests: Recent Labs  Lab 02/27/17 1514 03/04/17 0407 03/05/17 0348  AST _0 ALT _1 ALKPHOS 59 48 44  BILITOT 0.4 0.5 0.5  PROT 7.3 6.5 6.0*  ALBUMIN 2.7* 2.1* 1.9*   Recent Labs  Lab 02/27/17 1514  LIPASE 46   No results for input(s): AMMONIA in the last 168 hours. Coagulation Profile: Recent Labs  Lab 02/28/17 0423  INR 1.28   Cardiac Enzymes: Recent Labs  Lab 02/27/17 1514  TROPONINI <0.03   BNP (last 3 results) No results for input(s): PROBNP in the last 8760 hours. HbA1C: No results for input(s): HGBA1C in the last 72 hours. CBG: No results for input(s): GLUCAP in the last 168 hours. Lipid Profile: No results for input(s): CHOL, HDL, LDLCALC, TRIG, CHOLHDL, LDLDIRECT in the last 72 hours. Thyroid Function Tests: No results for input(s): TSH, T4TOTAL, FREET4, T3FREE, THYROIDAB in the last 72 hours. Anemia Panel: No results for input(s): VITAMINB12, FOLATE, FERRITIN, TIBC, IRON, RETICCTPCT in the last 72 hours. Sepsis Labs: Recent Labs  Lab 02/27/17 1814  03/01/17 0431 03/02/17 0338 03/03/17 0353 03/04/17 0407  PROCALCITON  --    < > 1.27 1.02 0.79 0.89  LATICACIDVEN 1.29  --   --   --   --   --    < > = values in this interval not displayed.    Recent Results (from the past 240 hour(s))  Blood culture (routine x 2)     Status: None   Collection Time: 02/27/17  6:00 PM    Result Value Ref Range Status   Specimen Description   Final    BLOOD BLOOD RIGHT HAND Performed at [facility], [address] [place], [place] [address]    Special Requests   Final    BOTTLES DRAWN AEROBIC AND ANAEROBIC Blood Culture adequate volume Performed at [facility], [address] [place], [place] [address]    Culture   Final    NO GROWTH 5 DAYS Performed at [facility], [place] [address], [place], [place] [address]    Report Status 03/04/2017 FINAL  Final  Blood culture (routine x 2)     Status: None   Collection Time: 02/27/17  6:15 PM  Result Value Ref Range Status   Specimen Description   Final    BLOOD LEFT ANTECUBITAL Performed at [facility], [address] [place], [place] [address]    Special Requests   Final    BOTTLES DRAWN AEROBIC AND ANAEROBIC Blood Culture adequate volume Performed at [facility], [address] [place], [place] [address]    Culture   Final    NO GROWTH 5 DAYS Performed at [facility], [place] [address], [place], [place] [address]    Report Status 03/04/2017 FINAL  Final  Urine culture     Status: Abnormal   Collection Time: 02/27/17  7:40 PM  Result Value Ref Range Status   Specimen Description   Final    URINE, RANDOM Performed at [facility], [address] [place], [place] [address]    Special Requests   Final    NONE Performed at [facility]  [address] [place] [place], [place] [address]    Culture >=100,000 COLONIES/mL KLEBSIELLA PNEUMONIAE (Jauan Wohl)  Final   Report Status 03/02/2017 FINAL  Final   Organism ID, Bacteria KLEBSIELLA PNEUMONIAE (Corey Caulfield)  Final      Susceptibility   Klebsiella pneumoniae - MIC*    AMPICILLIN >=32 RESISTANT Resistant     CEFAZOLIN <=4 SENSITIVE Sensitive     CEFTRIAXONE <=1 SENSITIVE Sensitive     CIPROFLOXACIN <=0.25 SENSITIVE Sensitive     GENTAMICIN <=1 SENSITIVE Sensitive     IMIPENEM <=0.25 SENSITIVE Sensitive      NITROFURANTOIN 64 INTERMEDIATE Intermediate     TRIMETH/SULFA <=20 SENSITIVE Sensitive     AMPICILLIN/SULBACTAM 8 SENSITIVE Sensitive     PIP/TAZO <=4 SENSITIVE Sensitive     Extended ESBL NEGATIVE Sensitive     * >=100,000 COLONIES/mL KLEBSIELLA PNEUMONIAE  Respiratory Panel by PCR     Status: None   Collection Time: 02/28/17  4:14 PM  Result Value Ref Range Status   Adenovirus NOT DETECTED NOT DETECTED Final   Coronavirus 229E NOT DETECTED NOT DETECTED Final   Coronavirus HKU1 NOT DETECTED NOT DETECTED Final   Coronavirus NL63 NOT DETECTED NOT DETECTED Final   Coronavirus OC43 NOT DETECTED NOT DETECTED Final   Metapneumovirus NOT DETECTED NOT DETECTED Final   Rhinovirus / Enterovirus NOT DETECTED NOT DETECTED Final   Influenza Semir Brill NOT DETECTED NOT DETECTED Final   Influenza B NOT DETECTED NOT DETECTED Final   Parainfluenza Virus 1 NOT DETECTED NOT DETECTED Final   Parainfluenza Virus 2 NOT DETECTED NOT DETECTED Final   Parainfluenza Virus 3 NOT DETECTED NOT DETECTED Final   Parainfluenza Virus 4 NOT DETECTED NOT DETECTED Final   Respiratory Syncytial Virus NOT DETECTED NOT DETECTED Final   Bordetella pertussis NOT DETECTED NOT DETECTED Final   Chlamydophila pneumoniae NOT DETECTED NOT DETECTED Final   Mycoplasma pneumoniae NOT DETECTED NOT DETECTED Final    Comment: Performed at [facility], [place]. [address] [place], [place] [address]  MRSA PCR Screening     Status: None   Collection Time: 03/01/17  9:40 AM  Result Value Ref Range Status   MRSA by PCR NEGATIVE NEGATIVE Final    Comment:        The GeneXpert MRSA Assay (FDA approved for NASAL specimens only), is one component of Abby Stines comprehensive MRSA colonization surveillance program. It is not intended to diagnose MRSA infection nor to guide or monitor treatment for MRSA infections. Performed at [facility], [place] [address] [place], [place] [address]          Radiology Studies: Dg Chest 2  View  Result Date: 03/03/2017 CLINICAL DATA:  Hypoxia. EXAM: CHEST  2 VIEW COMPARISON:  03/02/2017 as well as CT 02/27/2017 FINDINGS: Lungs are somewhat hypoinflated demonstrate persistent bilateral central perihilar opacification slightly worse. Small layering bilateral pleural effusions. Cardiomediastinal silhouette and remainder the exam is unchanged. IMPRESSION: Mild interval worsening bilateral hazy central perihilar opacification which may be due to worsening interstitial edema as infection is also possible. Known bilateral hilar adenopathy. Small pleural effusions unchanged. Electronically Signed   By: Marin Olp M.D.   On: 03/03/2017 10:36   Dg Chest 2 View  Result Date: 03/02/2017 CLINICAL DATA:  Hypoxia. EXAM: CHEST  2 VIEW COMPARISON:  Chest radiograph 02/27/2017 and chest CT 02/27/2017 FINDINGS: Increased densities at the left lung base particularly in the retrocardiac region. Increased interstitial densities at the right lung base. Cardiac silhouette is prominent but  similar to the previous examination and accentuated by the AP technique. Evidence for pleural effusions on the lateral view. IMPRESSION: Increased bibasilar chest densities, particularly on the left side. Findings are suggestive for Melvern Ramone combination of pleural fluid and consolidation/volume loss. Electronically Signed   By: Markus Daft M.D.   On: 03/02/2017 13:53   Vas [place] Upper Extremity Venous Duplex  Result Date: 03/03/2017 UPPER VENOUS STUDY  Indications: Edema Examination Guidelines: Jearlean Demauro complete evaluation includes B-mode imaging, spectral doppler, color doppler, and power doppler as needed of all accessible portions of each vessel. Bilateral testing is considered an integral part of Alfard Cochrane complete examination. Limited examinations for reoccurring indications may be performed as noted. Findings: +----------+--------------+--------+------------+------------+-------+---------+ RIGHT        Patency     ThrombusCompressibleSpontaneous Phasic Augmented +----------+--------------+--------+------------+------------+-------+---------+ IJV           patent                yes     Spontaneous.Phasic.          +----------+--------------+--------+------------+------------+-------+---------+ Subclavian    patent                        Spontaneous.Phasic.          +----------+--------------+--------+------------+------------+-------+---------+ Axillary      patent                        Spontaneous.Phasic.          +----------+--------------+--------+------------+------------+-------+---------+ Brachial      patent                yes     Spontaneous.Phasic.          +----------+--------------+--------+------------+------------+-------+---------+ Radial        patent                yes                                  +----------+--------------+--------+------------+------------+-------+---------+ Ulnar         patent                yes                                  +----------+--------------+--------+------------+------------+-------+---------+ Cephalic    partially                no                                               occluded                                                    +----------+--------------+--------+------------+------------+-------+---------+ Basilic       patent                yes                                  +----------+--------------+--------+------------+------------+-------+---------+ +----------+--------------+--------+------------+------------+-------+---------+ LEFT  Patency    ThrombusCompressibleSpontaneous Phasic Augmented +----------+--------------+--------+------------+------------+-------+---------+ IJV           patent                yes     Spontaneous.Phasic.          +----------+--------------+--------+------------+------------+-------+---------+ Subclavian    patent                         Spontaneous.Phasic.          +----------+--------------+--------+------------+------------+-------+---------+ Axillary      patent                        Spontaneous.Phasic.          +----------+--------------+--------+------------+------------+-------+---------+ Brachial      patent                yes     Spontaneous.Phasic.          +----------+--------------+--------+------------+------------+-------+---------+ Radial        patent                yes                                  +----------+--------------+--------+------------+------------+-------+---------+ Ulnar         patent                yes                                  +----------+--------------+--------+------------+------------+-------+---------+ Cephalic    partially                no                                               occluded                                                    +----------+--------------+--------+------------+------------+-------+---------+ Basilic       patent                yes                                  +----------+--------------+--------+------------+------------+-------+---------+  Final Interpretation: No evidence of deep vein thrombosis involving the right and left upper extremities. Bilateral superficial thrombosis noted in the cephalic veins.  *See table(s) above for measurements and observations.  Deitra Mayo Electronically signed by Deitra Mayo on 03/03/2017 at 3:32:00 PM.  ** Final **       Scheduled Meds: . azithromycin  500 mg Oral q1800  . dronabinol  5 mg Oral BID AC  . feeding supplement (ENSURE ENLIVE)  237 mL Oral Q24H  . finasteride  5 mg Oral Daily  . fluticasone  1 spray Each Nare Daily  . heparin injection (subcutaneous)  5,000 Units Subcutaneous Q8H  . levalbuterol  0.63 mg Nebulization TID  . metoprolol tartrate  75 mg Oral BID  . multivitamin with minerals  1 tablet Oral Daily  .  pantoprazole  40 mg Oral Daily  . sodium chloride flush  3 mL Intravenous Q12H  . tamsulosin  0.4 mg Oral Daily   Continuous Infusions: . sodium chloride    . sodium chloride 100 mL/hr at 03/05/17 0603  . ceFEPime (MAXIPIME) IV 1 g (03/05/17 1027)     LOS: 5 days    Time spent: over 14 min    Fayrene Helper, MD [facility] Pager [phone]  If 7PM-7AM, please contact night-coverage [email or web address] Password [email or web address] 03/05/2017, 11:10 AM

## 2017-03-06 ENCOUNTER — Inpatient Hospital Stay (HOSPITAL_COMMUNITY): Payer: Medicare HMO

## 2017-03-06 DIAGNOSIS — J342 Deviated nasal septum: Secondary | ICD-10-CM

## 2017-03-06 DIAGNOSIS — R0602 Shortness of breath: Secondary | ICD-10-CM

## 2017-03-06 DIAGNOSIS — R140 Abdominal distension (gaseous): Secondary | ICD-10-CM

## 2017-03-06 DIAGNOSIS — C8440 Peripheral T-cell lymphoma, not classified, unspecified site: Secondary | ICD-10-CM | POA: Diagnosis present

## 2017-03-06 DIAGNOSIS — E43 Unspecified severe protein-calorie malnutrition: Secondary | ICD-10-CM

## 2017-03-06 DIAGNOSIS — R591 Generalized enlarged lymph nodes: Secondary | ICD-10-CM

## 2017-03-06 DIAGNOSIS — L299 Pruritus, unspecified: Secondary | ICD-10-CM

## 2017-03-06 DIAGNOSIS — J343 Hypertrophy of nasal turbinates: Secondary | ICD-10-CM

## 2017-03-06 DIAGNOSIS — Z978 Presence of other specified devices: Secondary | ICD-10-CM

## 2017-03-06 LAB — COMPREHENSIVE METABOLIC PANEL
ALT: 16 U/L — ABNORMAL LOW (ref 17–63)
ANION GAP: 6 (ref 5–15)
AST: 22 U/L (ref 15–41)
Albumin: 1.9 g/dL — ABNORMAL LOW (ref 3.5–5.0)
Alkaline Phosphatase: 55 U/L (ref 38–126)
BILIRUBIN TOTAL: 0.6 mg/dL (ref 0.3–1.2)
BUN: 37 mg/dL — ABNORMAL HIGH (ref 6–20)
CHLORIDE: 109 mmol/L (ref 101–111)
CO2: 21 mmol/L — ABNORMAL LOW (ref 22–32)
Calcium: 8.5 mg/dL — ABNORMAL LOW (ref 8.9–10.3)
Creatinine, Ser: 1.85 mg/dL — ABNORMAL HIGH (ref 0.61–1.24)
GFR calc Af Amer: 37 mL/min — ABNORMAL LOW (ref >60)
GFR, EST NON AFRICAN AMERICAN: 32 mL/min — AB (ref >60)
Glucose, Bld: 134 mg/dL — ABNORMAL HIGH (ref 65–99)
POTASSIUM: 4.6 mmol/L (ref 3.5–5.1)
Sodium: 136 mmol/L (ref 135–145)
TOTAL PROTEIN: 6.9 g/dL (ref 6.5–8.1)

## 2017-03-06 LAB — CBC
HEMATOCRIT: 32.5 % — AB (ref 39.0–52.0)
Hemoglobin: 10.8 g/dL — ABNORMAL LOW (ref 13.0–17.0)
MCH: 33 pg (ref 26.0–34.0)
MCHC: 33.2 g/dL (ref 30.0–36.0)
MCV: 99.4 fL (ref 78.0–100.0)
Platelets: 197 10*3/uL (ref 150–400)
RBC: 3.27 MIL/uL — AB (ref 4.22–5.81)
RDW: 16.6 % — ABNORMAL HIGH (ref 11.5–15.5)
WBC: 13.2 10*3/uL — ABNORMAL HIGH (ref 4.0–10.5)

## 2017-03-06 LAB — EPSTEIN BARR VRS(EBV DNA BY PCR)
EBV DNA QN by PCR: 25000 copies/mL
log10 EBV DNA Qn PCR: 4.398 log10 copy/mL

## 2017-03-06 LAB — GLUCOSE, CAPILLARY
GLUCOSE-CAPILLARY: 113 mg/dL — AB (ref 65–99)
GLUCOSE-CAPILLARY: 123 mg/dL — AB (ref 65–99)
GLUCOSE-CAPILLARY: 150 mg/dL — AB (ref 65–99)
Glucose-Capillary: 121 mg/dL — ABNORMAL HIGH (ref 65–99)

## 2017-03-06 LAB — MAGNESIUM: Magnesium: 2.3 mg/dL (ref 1.7–2.4)

## 2017-03-06 LAB — HIV ANTIBODY (ROUTINE TESTING W REFLEX): HIV Screen 4th Generation wRfx: NONREACTIVE

## 2017-03-06 MED ORDER — PREDNISONE 20 MG PO TABS
60.0000 mg | ORAL_TABLET | Freq: Every day | ORAL | Status: AC
  Administered 2017-03-08 – 2017-03-11 (×4): 60 mg via ORAL
  Filled 2017-03-06 (×4): qty 3

## 2017-03-06 MED ORDER — ALLOPURINOL 100 MG PO TABS
50.0000 mg | ORAL_TABLET | Freq: Every day | ORAL | Status: DC
  Administered 2017-03-06 – 2017-03-10 (×5): 50 mg via ORAL
  Filled 2017-03-06 (×5): qty 1

## 2017-03-06 MED ORDER — PRO-STAT SUGAR FREE PO LIQD
30.0000 mL | Freq: Every day | ORAL | Status: DC
  Administered 2017-03-07: 30 mL
  Filled 2017-03-06 (×6): qty 30

## 2017-03-06 MED ORDER — JEVITY 1.2 CAL PO LIQD
1000.0000 mL | ORAL | Status: DC
  Filled 2017-03-06: qty 1000

## 2017-03-06 MED ORDER — OSMOLITE 1.5 CAL PO LIQD
1000.0000 mL | ORAL | Status: DC
  Administered 2017-03-06 – 2017-03-11 (×4): 1000 mL
  Filled 2017-03-06 (×12): qty 1000

## 2017-03-06 MED ORDER — ENSURE ENLIVE PO LIQD
237.0000 mL | Freq: Two times a day (BID) | ORAL | Status: DC
  Administered 2017-03-07 – 2017-03-13 (×5): 237 mL via ORAL

## 2017-03-06 MED ORDER — SODIUM CHLORIDE 0.9 % IV SOLN
8.0000 mg | Freq: Three times a day (TID) | INTRAVENOUS | Status: DC | PRN
  Filled 2017-03-06: qty 4

## 2017-03-06 NOTE — Progress Notes (Signed)
Patient ID: Jose Meza, male   DOB: 1931/04/03, 84 y.o.   MRN: [id number]          [facility]  Date of Admission:  02/27/2017     ASSESSMENT: He seems to be doing a little bit better today, most likely because of the Solu-Medrol that was started yesterday.  I do not find any convincing evidence of active infection.  PLAN: 1. Continue observation off of antibiotics 2. Await results of lymph node biopsy  Principal Problem:   Fever Active Problems:   Pulmonary nodules/lesions, multiple   Generalized lymphadenopathy   Unintentional weight loss   Rash   Hyperlipidemia   Hypertension   CKD (chronic kidney disease) stage 3, GFR 30-59 ml/min (HCC)   Rheumatoid arthritis of multiple sites with negative rheumatoid factor (HCC)   Atherosclerotic peripheral vascular disease (HCC)   Scheduled Meds: . dronabinol  5 mg Oral BID AC  . [START ON 03/07/2017] feeding supplement (ENSURE ENLIVE)  237 mL Oral BID BM  . feeding supplement (PRO-STAT SUGAR FREE 64)  30 mL Per Tube Daily  . finasteride  5 mg Oral Daily  . fluticasone  1 spray Each Nare Daily  . heparin injection (subcutaneous)  5,000 Units Subcutaneous Q8H  . levalbuterol  0.63 mg Nebulization TID  . metoprolol tartrate  75 mg Oral BID  . multivitamin with minerals  1 tablet Oral Daily  . pantoprazole  40 mg Oral Daily  . predniSONE  50 mg Oral Q breakfast  . sodium chloride flush  3 mL Intravenous Q12H  . tamsulosin  0.4 mg Oral Daily   Continuous Infusions: . sodium chloride    . feeding supplement (OSMOLITE 1.5 CAL)     PRN Meds:.sodium chloride, acetaminophen, albuterol, alum & mag hydroxide-simeth, benzonatate, guaiFENesin, ibuprofen, LORazepam, sodium chloride, sodium chloride flush   SUBJECTIVE: He underwent NG tube placement today which he states was the worst experience of his life.  Otherwise he is feeling slightly better today.  He did not have any fever or chills overnight.  He  feels like he is breathing a little more easily.  He feels like his rash has improved.  He was able to eat a full bowl of soup today.  Review of Systems: Review of Systems  Constitutional: Positive for malaise/fatigue and weight loss. Negative for chills, diaphoresis and fever.  HENT: Positive for congestion. Negative for sore throat.   Respiratory: Positive for cough and shortness of breath. Negative for sputum production.   Cardiovascular: Negative for chest pain.  Gastrointestinal: Negative for abdominal pain, diarrhea, nausea and vomiting.  Genitourinary: Negative for dysuria.  Skin: Positive for itching and rash.    Allergies  Allergen Reactions  . Sulfa Antibiotics Other (See Comments)    Paradoxical effect. Patient states Sulfa does not work for him.    OBJECTIVE: Vitals:   03/06/17 0702 03/06/17 0840 03/06/17 1422 03/06/17 1557  BP:    131/74  Pulse:    100  Resp:    18  Temp:    97.9 F (36.6 C)  TempSrc:    Oral  SpO2:  94% 95% 100%  Weight: 214 lb 4.6 oz (97.2 kg)     Height:       Body mass index is 25.41 kg/m.  Physical Exam  Constitutional: He is oriented to person, place, and time.  He seems to be in slightly better spirits this afternoon.  His son is visiting.  Cardiovascular: Normal rate and regular  rhythm.  No murmur heard. Pulmonary/Chest: Effort normal. He has no wheezes. He has no rales.  Abdominal: He exhibits distension.  Neurological: He is alert and oriented to person, place, and time.  Skin: Rash noted.  He has diffuse maculopapular rash may be a little less erythematous.  Psychiatric: Mood and affect normal.    Lab Results Lab Results  Component Value Date   WBC 13.2 (H) 03/06/2017   HGB 10.8 (L) 03/06/2017   HCT 32.5 (L) 03/06/2017   MCV 99.4 03/06/2017   PLT 197 03/06/2017    Lab Results  Component Value Date   CREATININE 1.85 (H) 03/06/2017   BUN 37 (H) 03/06/2017   NA 136 03/06/2017   K 4.6 03/06/2017   CL 109 03/06/2017    CO2 21 (L) 03/06/2017    Lab Results  Component Value Date   ALT 16 (L) 03/06/2017   AST 22 03/06/2017   ALKPHOS 55 03/06/2017   BILITOT 0.6 03/06/2017     Microbiology: Recent Results (from the past 240 hour(s))  Blood culture (routine x 2)     Status: None   Collection Time: 02/27/17  6:00 PM  Result Value Ref Range Status   Specimen Description   Final    BLOOD BLOOD RIGHT HAND Performed at [facility], [address] [place], [place] [address]    Special Requests   Final    BOTTLES DRAWN AEROBIC AND ANAEROBIC Blood Culture adequate volume Performed at [facility], [address] [place], [place] [address]    Culture   Final    NO GROWTH 5 DAYS Performed at [facility], [place] [address] [place], [place] [address]    Report Status 03/04/2017 FINAL  Final  Blood culture (routine x 2)     Status: None   Collection Time: 02/27/17  6:15 PM  Result Value Ref Range Status   Specimen Description   Final    BLOOD LEFT ANTECUBITAL Performed at [facility], [address] [place], [place] [address]    Special Requests   Final    BOTTLES DRAWN AEROBIC AND ANAEROBIC Blood Culture adequate volume Performed at [facility], [address] [place], [place] [address]    Culture   Final    NO GROWTH 5 DAYS Performed at [facility], [place] [address] [place], [place] [address]    Report Status 03/04/2017 FINAL  Final  Urine culture     Status: Abnormal   Collection Time: 02/27/17  7:40 PM  Result Value Ref Range Status   Specimen Description   Final    URINE, RANDOM Performed at [facility], [address] [place], [place] [address]    Special Requests   Final    NONE Performed at [facility], [address] [place], [place] [address]    Culture >=100,000 COLONIES/mL KLEBSIELLA PNEUMONIAE (A)  Final   Report Status 03/02/2017 FINAL  Final   Organism ID, Bacteria KLEBSIELLA PNEUMONIAE (A)   Final      Susceptibility   Klebsiella pneumoniae - MIC*    AMPICILLIN >=32 RESISTANT Resistant     CEFAZOLIN <=4 SENSITIVE Sensitive     CEFTRIAXONE <=1 SENSITIVE Sensitive     CIPROFLOXACIN <=0.25 SENSITIVE Sensitive     GENTAMICIN <=1 SENSITIVE Sensitive     IMIPENEM <=0.25 SENSITIVE Sensitive     NITROFURANTOIN 64 INTERMEDIATE Intermediate  TRIMETH/SULFA <=20 SENSITIVE Sensitive     AMPICILLIN/SULBACTAM 8 SENSITIVE Sensitive     PIP/TAZO <=4 SENSITIVE Sensitive     Extended ESBL NEGATIVE Sensitive     * >=100,000 COLONIES/mL KLEBSIELLA PNEUMONIAE  Respiratory Panel by PCR     Status: None   Collection Time: 02/28/17  4:14 PM  Result Value Ref Range Status   Adenovirus NOT DETECTED NOT DETECTED Final   Coronavirus 229E NOT DETECTED NOT DETECTED Final   Coronavirus HKU1 NOT DETECTED NOT DETECTED Final   Coronavirus NL63 NOT DETECTED NOT DETECTED Final   Coronavirus OC43 NOT DETECTED NOT DETECTED Final   Metapneumovirus NOT DETECTED NOT DETECTED Final   Rhinovirus / Enterovirus NOT DETECTED NOT DETECTED Final   Influenza A NOT DETECTED NOT DETECTED Final   Influenza B NOT DETECTED NOT DETECTED Final   Parainfluenza Virus 1 NOT DETECTED NOT DETECTED Final   Parainfluenza Virus 2 NOT DETECTED NOT DETECTED Final   Parainfluenza Virus 3 NOT DETECTED NOT DETECTED Final   Parainfluenza Virus 4 NOT DETECTED NOT DETECTED Final   Respiratory Syncytial Virus NOT DETECTED NOT DETECTED Final   Bordetella pertussis NOT DETECTED NOT DETECTED Final   Chlamydophila pneumoniae NOT DETECTED NOT DETECTED Final   Mycoplasma pneumoniae NOT DETECTED NOT DETECTED Final    Comment: Performed at [facility], [place] [address] [place], [place] [address]  MRSA PCR Screening     Status: None   Collection Time: 03/01/17  9:40 AM  Result Value Ref Range Status   MRSA by PCR NEGATIVE NEGATIVE Final    Comment:        The GeneXpert MRSA Assay (FDA approved for NASAL specimens only), is one  component of a comprehensive MRSA colonization surveillance program. It is not intended to diagnose MRSA infection nor to guide or monitor treatment for MRSA infections. Performed at [facility], [place] [address] [place], [place] [address]     Michel Bickers, [place] [facility] [employer] [phone] pager   [phone] cell 03/06/2017, 5:04 PM

## 2017-03-06 NOTE — Progress Notes (Signed)
  Speech Language Pathology Treatment: Dysphagia  Patient Details Name: Jose Meza MRN: [id number] DOB: 10/30/1931 Today's Date: 03/06/2017 Time: 0762-2633 SLP Time Calculation (min) (ACUTE ONLY): 52 min  Assessment / Plan / Recommendation Clinical Impression  Session focused on significant amount of education re: dysphagia and po intake with pt and daughter. Intake has been poor past 1-2 weeks due to decreased appetite and primarily altered taste. SLP educated/explained relationship between poor intake and decreased quantity and quality of saliva production and importance of oral hygiene and moisture. Pt states he has agreed to an NGT tube and asked if discussions on diet/liquids continues to be relevant with SLP explanation re: importance of continuing to use swallow musculature to supplement nutrition by NG. Moderate verbal and verbal cues for chin tuck use with thin and keep chin down during sub swallow. One immediate cough following 2 separate but close together sips of thin over multiple trials thin and jello. Respiration and swallow process explained and recommendation of rest breaks. Question oral control/cohesion with premature spill due to pts awareness/sensation of "when he's about to strangle." Recommend he continue clear liquids for energy conservation, will follow and may suggest upgrading texture with pt/family ordering softer solids if/when appropriate.      HPI HPI: 84 y.o. male with medical history significant of chronic kidney disease, rheumatoid arthritis chronically on steroids, hypertension transferred here from [facility] after finding use disease in his chest concerning for lymphoma.  Patient arrived there with complaints of coughing for over a week with chills and low-grade fevers at home.  Is lost over 20 pounds in the last 4-6 weeks.  He is recently been treated for prostatitis with an unknown antibiotic a month ago.  His symptoms have persisted however despite  recent steroid taper for bronchitis also.  He denies any nausea vomiting or diarrhea.  He has not noticed any swollen areas on his body lately.  He denies any rashes.  CTA was done which showed diffuse disease concerning for lymphoma with possible pneumonia.  Patient is referred for admission for hypoxia with 90% O2 sats on room air along with possible pneumonia.  He denies any pain.      SLP Plan  Continue with current plan of care       Recommendations  Diet recommendations: Thin liquid(clear liquids) Liquids provided via: Cup Medication Administration: Whole meds with puree Supervision: Patient able to self feed;Full supervision/cueing for compensatory strategies Compensations: Slow rate;Small sips/bites;Minimize environmental distractions(rest breaks) Postural Changes and/or Swallow Maneuvers: Seated upright 90 degrees;Chin tuck                Oral Care Recommendations: Oral care BID Follow up Recommendations: Other (comment)(TBD) SLP Visit Diagnosis: Dysphagia, pharyngeal phase (R13.13) Plan: Continue with current plan of care                      Jose Meza 03/06/2017, 12:27 PM   Jose Meza Jose Meza.[employer] [phone]

## 2017-03-06 NOTE — Progress Notes (Signed)
Confirmed with MD to place NGT. Per MD, no xray needed to check placement

## 2017-03-06 NOTE — Progress Notes (Signed)
Nutrition Follow-up  DOCUMENTATION CODES:   Not applicable  INTERVENTION:  - Will increase Ensure Enlive to BID, each supplement provides 350 kcal and 20 grams of protein. - Will order Magic Cup BID with meals, each supplement provides 290 kcal and 9 grams of protein.  Will order 30 mL Prostat once/day and Osmolite 1.5 @ 30 mL/hr to increase by 10 mL every 8 hours to reach goal rate of Osmolite 1.5 @ 60 mL/hr. At goal rate, this regimen will provide 2260 kcal, 105 grams of protein (95% minimum estimated protein need), and 1097 mL free water.    NUTRITION DIAGNOSIS:   Inadequate oral intake related to acute illness, decreased appetite as evidenced by per patient/family report. -ongoing  GOAL:   Patient will meet greater than or equal to 90% of their needs -unmet  MONITOR:   PO intake, Supplement acceptance, TF tolerance, Weight trends  REASON FOR ASSESSMENT:   Consult Enteral/tube feeding initiation and management  ASSESSMENT:   84 y.o. male with medical history significant for CKD, rheumatoid arthritis chronically on steroids, HTN. He transferred from [facility] after findings concerning for lymphoma.  Patient arrived there with complaints of coughing for over a week with chills and low-grade fevers at home. He reports losing over 20 pounds in the last 4-6 weeks. He was recently treated (a month ago) for prostatitis with an unknown antibiotic.  Noted diet order changes since admission. Pt currently on FLD, thin liquids. He is being followed by SLP and was most recently seen this AM. Pt was consuming cream of chicken soup at time of RD visit. He denies ever having abdominal pain or nausea with or without PO intakes. He reported feeling that he was able to swallow the soup he was eating better than thinner liquids and states that he has had several episodes since admission of items going "into the windpipe" and states that on several occassions this caused a coughing spell.    He enjoys ice cream and has been sipping on Ensure today. Encouraged pt to use Ensure to take oral medications.   Family at bedside asked several questions pertaining to TF and estimated needs. Feeding tube not yet in place. Pt was seen by ENT MD this AM and note reviewed in detail. Will continue to monitor plan concerning feeding tube given information outlined in that note.   Meal tickets not located in the room, on the door, or in the physical chart; unable to complete day 1 results of Calorie Count.  Medications reviewed; 5 mg Marinol BID started 2/18, 60 mg Solu-medrol x1 dose yesterday, daily multivitamin with minerals, 50 mg Deltasone/day. Labs reviewed; BUN: 37 mg/dL, creatinine: 1.85 mg/dL, Ca: 8.5 mg/dL, GFR: 32 mg/dL.    IVF: D5-NS @ 125 mL/hr (510 kcal).      Diet Order:  Diet full liquid Room service appropriate? Yes; Fluid consistency: Thin  EDUCATION NEEDS:   No education needs have been identified at this time  Skin:  Skin Assessment: Reviewed RN Assessment  Last BM:  2/19  Height:   Ht Readings from Last 1 Encounters:  03/01/17 6\' 5"  (1.956 m)    Weight:   Wt Readings from Last 1 Encounters:  03/06/17 214 lb 4.6 oz (97.2 kg)    Ideal Body Weight:  94.54 kg  BMI:  Body mass index is 25.41 kg/m.  Estimated Nutritional Needs:   Kcal:  2637-8588 (24-26 kcal/kg)  Protein:  110-127 grams (1.2-1.4 grams/kg)  Fluid:  >/= 2.2 L/day  Jarome Matin, MS, RD, LDN, [facility] In[facility] Clinical Dietitian Pager # [phone] After hours/weekend pager # [phone]

## 2017-03-06 NOTE — Progress Notes (Signed)
HEMATOLOGY-ONCOLOGY PROGRESS NOTE  SUBJECTIVE: Seen the patient to discuss the pathology report showing angioimmunoblastic T-cell lymphoma. Patient appears to be having issues with shortness of breath, fatigue, loss of appetite, requiring tube feeds.  OBJECTIVE: REVIEW OF SYSTEMS:   Constitutional: Denies fevers, chills or abnormal weight loss Eyes: Denies blurriness of vision Ears, nose, mouth, throat, and face: Denies mucositis or sore throat Respiratory: Shortness of breath to minimal exertion,  Cardiovascular: Denies palpitation, chest discomfort Gastrointestinal: No appetite Skin: Denies abnormal skin rashes Lymphatics: Right inguinal lymph node biopsy Neurological: Severe generalized weakness Behavioral/Psych: Mood is stable, no new changes  Extremities: No lower extremity edema  All other systems were reviewed with the patient and are negative.  I have reviewed the past medical history, past surgical history, social history and family history with the patient and they are unchanged from previous note.   PHYSICAL EXAMINATION: ECOG PERFORMANCE STATUS: 3 - Symptomatic, >50% confined to bed  Vitals:   03/06/17 1422 03/06/17 1557  BP:  131/74  Pulse:  100  Resp:  18  Temp:  97.9 F (36.6 C)  SpO2: 95% 100%   Filed Weights   03/05/17 0445 03/05/17 1528 03/06/17 0702  Weight: 214 lb 11.7 oz (97.4 kg) 216 lb 0.8 oz (98 kg) 214 lb 4.6 oz (97.2 kg)    GENERAL:alert, no distress and comfortable SKIN: skin color, texture, turgor are normal, no rashes or significant lesions EYES: normal, Conjunctiva are pink and non-injected, sclera clear OROPHARYNX:no exudate, no erythema and lips, buccal mucosa, and tongue normal  NECK: supple, thyroid normal size, non-tender, without nodularity LYMPH:  no palpable lymphadenopathy in the cervical, axillary or inguinal LUNGS: Decreased breath sounds at the lung bases HEART: regular rate & rhythm and no murmurs and no lower extremity  edema ABDOMEN:abdomen soft, non-tender and normal bowel sounds Musculoskeletal:no cyanosis of digits and no clubbing  NEURO: alert & oriented x 3 with fluent speech, no focal motor/sensory deficits  LABORATORY DATA:  I have reviewed the data as listed CMP Latest Ref Rng & Units 03/06/2017 03/05/2017 03/04/2017  Glucose 65 - 99 mg/dL 134(H) 91 90  BUN 6 - 20 mg/dL 37(H) 35(H) 32(H)  Creatinine 0.61 - 1.24 mg/dL 1.85(H) 2.02(H) 2.03(H)  Sodium 135 - 145 mmol/L 136 136 136  Potassium 3.5 - 5.1 mmol/L 4.6 4.0 4.0  Chloride 101 - 111 mmol/L 109 108 107  CO2 22 - 32 mmol/L 21(L) 19(L) 21(L)  Calcium 8.9 - 10.3 mg/dL 8.5(L) 8.3(L) 8.6(L)  Total Protein 6.5 - 8.1 g/dL 6.9 6.0(L) 6.5  Total Bilirubin 0.3 - 1.2 mg/dL 0.6 0.5 0.5  Alkaline Phos 38 - 126 U/L 55 44 48  AST 15 - 41 U/L _0 ALT 17 - 63 U/L 16(L) 17 17    Lab Results  Component Value Date   WBC 13.2 (H) 03/06/2017   HGB 10.8 (L) 03/06/2017   HCT 32.5 (L) 03/06/2017   MCV 99.4 03/06/2017   PLT 197 03/06/2017   NEUTROABS 7.8 (H) 02/28/2017    ASSESSMENT AND PLAN: 1.  Angioimmunoblastic T-cell lymphoma: I discussed with the patient and his family that these tumors tend to have variable rates of aggression.  Some of them are slow growing others can be aggressive.  I believe the patient has an aggressive variety.  He has profound clinical symptoms of lymphadenopathy causing airway restriction.  Therefore I believe he does need systemic therapy.  Prednisone alone cantreat these are rare tumors however in his situation I recommend  systemic therapy with CHOP.  Reduce dosage of Adriamycin and cyclophosphamide will be given given his advanced age. 2 chemo counseling/Consent: I discussed with him the risks and benefits of systemic chemotherapy.  These risks include nausea vomiting, hair loss, decrease in blood counts putting him at risk for infection, fatigue, cardiac toxicities from Adriamycin, neuropathy risk with vincristine,  prednisone related adverse effects.  Tumor lysis can also be a major risk. Risks include even death.  Patient understands these risks and his consented to proceed with the treatment. I called interventional radiology and asked them to put a port as well as get a bone marrow biopsy.  Our plan is to start chemotherapy on Friday. If the patient and his family decide they do not want to get treated here, we can transfer him to a tertiary center if they chose to do so. Patient wants [place] to be as aggressive as possible with his cancer. Patient's son was at his bedside and his daughter was on the phone.

## 2017-03-06 NOTE — Progress Notes (Signed)
   03/06/17 1300  Clinical Encounter Type  Visited With Patient and family together  Visit Type Initial  Referral From Nurse;Family  Consult/Referral To Chaplain  Spiritual Encounters  Spiritual Needs Literature;Grief support   Responded to a request that the family made about information on a HCPA.  Spoke to the nurse prior to entering.  Patient was in the bed with his grandson beside him.  When I explained why I had come he said, I think you got some bad information.  I have recently updated everything with my attorney and so I have that covered.  I supported him in having everything in line and having those discussions with his family.  Patient stated that 7 days from today will be the first anniversary of his wife's death.  He seemed very sad about this.  I have shared this with the nurse.  That may provide some assistance as to the rest of the family.  Will continue to follow and support as needed. Chaplain Katherene Ponto

## 2017-03-06 NOTE — Progress Notes (Signed)
[facility] PROGRESS NOTE  Jose Meza ZDG:[id number] DOB: 03-28-1931 DOA: 02/27/2017  PCP: Lajean Manes, MD  Brief History/Interval Summary: 84 year old Caucasian male with a past medical history of rheumatoid arthritis chronically on steroids, hypertension was hospitalized due to extensive lymphadenopathy detected on imaging studies.  Patient's symptoms included chills fever weight loss shortness of breath.  Concern is for Lymphoma.  Reason for Visit: Lymphadenopathy  Consultants: Oncology.  General surgery.  Procedures:   US Guided Biopsy of L inguinal LN 02/28/17  Excisional Biopsy of L inguinal LN 03/04/17   Antibiotics: Patient has completed a course of antibiotics with ceftriaxone and azithromycin  Subjective/Interval History: Patient states that his breathing is improving.  His rash is improved.  His daughter is very concerned about his poor oral intake.  Patient states that he has poor appetite and also has a metallic taste in his mouth.  He has been trying to drink Ensure but has been struggling to do so.  He denies any feeling of food or drink getting stuck.  ROS: Denies any chest pain  Objective:  Vital Signs  Vitals:   03/05/17 2218 03/06/17 0422 03/06/17 0702 03/06/17 0840  BP: 118/67 130/77    Pulse: (!) 114 100    Resp: 20 18    Temp: 98.8 F (37.1 C) 98.1 F (36.7 C)    TempSrc: Axillary Oral    SpO2: 97% 96%  94%  Weight:   97.2 kg (214 lb 4.6 oz)   Height:        Intake/Output Summary (Last 24 hours) at 03/06/2017 1416 Last data filed at 03/06/2017 1200 Gross per 24 hour  Intake 5147.91 ml  Output 700 ml  Net 4447.91 ml   Filed Weights   03/05/17 0445 03/05/17 1528 03/06/17 0702  Weight: 97.4 kg (214 lb 11.7 oz) 98 kg (216 lb 0.8 oz) 97.2 kg (214 lb 4.6 oz)    General appearance: alert, cooperative, appears stated age and no distress Head: Normocephalic, without obvious abnormality, atraumatic Resp: Diminished air entry at the  bases.  No definite crackles wheezing or rhonchi. Cardio: regular rate and rhythm, S1, S2 normal, no murmur, click, rub or gallop GI: soft, non-tender; bowel sounds normal; no masses,  no organomegaly Extremities: extremities normal, atraumatic, no cyanosis or edema Neurologic: Generalized fatigue is appreciated.  But no focal neurological deficits.  Lab Results:  Data Reviewed: I have personally reviewed following labs and imaging studies  CBC: Recent Labs  Lab 02/27/17 1514 02/28/17 0423  03/02/17 0338 03/03/17 0353 03/04/17 0407 03/05/17 0348 03/06/17 0523  WBC 11.6* 10.0   < > 11.1* 13.1* 12.3* 13.3* 13.2*  NEUTROABS 9.0* 7.8*  --   --   --   --   --   --   HGB 12.2* 10.9*   < > 10.8* 11.7* 11.5* 10.9* 10.8*  HCT 35.3* 32.1*   < > 32.3* 34.6* 35.4* 32.6* 32.5*  MCV 96.7 98.2   < > 98.2 98.3 98.9 98.8 99.4  PLT 207 217   < > 208 191 200 175 197   < > = values in this interval not displayed.    Basic Metabolic Panel: Recent Labs  Lab 03/02/17 0338 03/03/17 0353 03/04/17 0407 03/05/17 0348 03/06/17 0523  NA 135 134* 136 136 136  K 3.9 4.0 4.0 4.0 4.6  CL 107 105 107 108 109  CO2 19* 21* 21* 19* 21*  GLUCOSE 99 100* 90 91 134*  BUN 26* 26* 32* 35* 37*  CREATININE 1.63* 1.72* 2.03* 2.02* 1.85*  CALCIUM 8.4* 8.7* 8.6* 8.3* 8.5*  MG 2.2 2.1 2.1 2.1 2.3    GFR: Estimated Creatinine Clearance: 38.1 mL/min (A) (by C-G formula based on SCr of 1.85 mg/dL (H)).  Liver Function Tests: Recent Labs  Lab 02/27/17 1514 03/04/17 0407 03/05/17 0348 03/06/17 0523  AST 29 20 20 22   ALT 25 17 17  16*  ALKPHOS 59 48 44 55  BILITOT 0.4 0.5 0.5 0.6  PROT 7.3 6.5 6.0* 6.9  ALBUMIN 2.7* 2.1* 1.9* 1.9*    Recent Labs  Lab 02/27/17 1514  LIPASE 46   Coagulation Profile: Recent Labs  Lab 02/28/17 0423  INR 1.28    Cardiac Enzymes: Recent Labs  Lab 02/27/17 1514  TROPONINI <0.03    CBG: Recent Labs  Lab 03/06/17 1202  GLUCAP 150*    Recent Results (from  the past 240 hour(s))  Blood culture (routine x 2)     Status: None   Collection Time: 02/27/17  6:00 PM  Result Value Ref Range Status   Specimen Description   Final    BLOOD BLOOD RIGHT HAND Performed at [facility], [address] [place], [place] [address]    Special Requests   Final    BOTTLES DRAWN AEROBIC AND ANAEROBIC Blood Culture adequate volume Performed at [facility], [address], [place], [place] [address]    Culture   Final    NO GROWTH 5 DAYS Performed at [facility], [place] [address], [place], [place] [address]    Report Status 03/04/2017 FINAL  Final  Blood culture (routine x 2)     Status: None   Collection Time: 02/27/17  6:15 PM  Result Value Ref Range Status   Specimen Description   Final    BLOOD LEFT ANTECUBITAL Performed at [facility], [address] [place], [place] [address]    Special Requests   Final    BOTTLES DRAWN AEROBIC AND ANAEROBIC Blood Culture adequate volume Performed at [facility], [address] [place], [place] [address]    Culture   Final    NO GROWTH 5 DAYS Performed at [facility], [place] [address], [place], [place] [address]    Report Status 03/04/2017 FINAL  Final  Urine culture     Status: Abnormal   Collection Time: 02/27/17  7:40 PM  Result Value Ref Range Status   Specimen Description   Final    URINE, RANDOM Performed at [facility], [address] [place], [place] [address]    Special Requests   Final    NONE Performed at [facility], [address] [place], [place] [address]    Culture >=100,000 COLONIES/mL KLEBSIELLA PNEUMONIAE (A)  Final   Report Status 03/02/2017 FINAL  Final   Organism ID, Bacteria KLEBSIELLA PNEUMONIAE (A)  Final      Susceptibility   Klebsiella pneumoniae - MIC*    AMPICILLIN >=32 RESISTANT Resistant     CEFAZOLIN <=4 SENSITIVE Sensitive     CEFTRIAXONE <=1 SENSITIVE Sensitive     CIPROFLOXACIN  <=0.25 SENSITIVE Sensitive     GENTAMICIN <=1 SENSITIVE Sensitive     IMIPENEM <=0.25 SENSITIVE Sensitive     NITROFURANTOIN 64 INTERMEDIATE Intermediate     TRIMETH/SULFA <=20 SENSITIVE Sensitive     AMPICILLIN/SULBACTAM 8 SENSITIVE Sensitive     PIP/TAZO <=4 SENSITIVE Sensitive  Extended ESBL NEGATIVE Sensitive     * >=100,000 COLONIES/mL KLEBSIELLA PNEUMONIAE  Respiratory Panel by PCR     Status: None   Collection Time: 02/28/17  4:14 PM  Result Value Ref Range Status   Adenovirus NOT DETECTED NOT DETECTED Final   Coronavirus 229E NOT DETECTED NOT DETECTED Final   Coronavirus HKU1 NOT DETECTED NOT DETECTED Final   Coronavirus NL63 NOT DETECTED NOT DETECTED Final   Coronavirus OC43 NOT DETECTED NOT DETECTED Final   Metapneumovirus NOT DETECTED NOT DETECTED Final   Rhinovirus / Enterovirus NOT DETECTED NOT DETECTED Final   Influenza A NOT DETECTED NOT DETECTED Final   Influenza B NOT DETECTED NOT DETECTED Final   Parainfluenza Virus 1 NOT DETECTED NOT DETECTED Final   Parainfluenza Virus 2 NOT DETECTED NOT DETECTED Final   Parainfluenza Virus 3 NOT DETECTED NOT DETECTED Final   Parainfluenza Virus 4 NOT DETECTED NOT DETECTED Final   Respiratory Syncytial Virus NOT DETECTED NOT DETECTED Final   Bordetella pertussis NOT DETECTED NOT DETECTED Final   Chlamydophila pneumoniae NOT DETECTED NOT DETECTED Final   Mycoplasma pneumoniae NOT DETECTED NOT DETECTED Final    Comment: Performed at [facility], [place]. [address] [place], [place] [address]  MRSA PCR Screening     Status: None   Collection Time: 03/01/17  9:40 AM  Result Value Ref Range Status   MRSA by PCR NEGATIVE NEGATIVE Final    Comment:        The GeneXpert MRSA Assay (FDA approved for NASAL specimens only), is one component of a comprehensive MRSA colonization surveillance program. It is not intended to diagnose MRSA infection nor to guide or monitor treatment for MRSA infections. Performed at [facility], [place] [address] [place], [place] [address]       Radiology Studies: Dg Chest Port 1 View  Result Date: 03/05/2017 CLINICAL DATA:  Hypoxia EXAM: PORTABLE CHEST 1 VIEW COMPARISON:  03/03/2017 FINDINGS: Bilateral airspace opacities are again noted, unchanged. Heart appears normal size. Possible small layering left effusion. No acute bony abnormality. No real change since prior study. IMPRESSION: No interval change in bilateral perihilar and lower lobe airspace opacities and possible small layering effusion. This could reflect edema or infection. Electronically Signed   By: Rolm Baptise M.D.   On: 03/05/2017 07:39     Medications:  Scheduled: . dronabinol  5 mg Oral BID AC  . feeding supplement (ENSURE ENLIVE)  237 mL Oral Q24H  . finasteride  5 mg Oral Daily  . fluticasone  1 spray Each Nare Daily  . heparin injection (subcutaneous)  5,000 Units Subcutaneous Q8H  . levalbuterol  0.63 mg Nebulization TID  . metoprolol tartrate  75 mg Oral BID  . multivitamin with minerals  1 tablet Oral Daily  . pantoprazole  40 mg Oral Daily  . predniSONE  50 mg Oral Q breakfast  . sodium chloride flush  3 mL Intravenous Q12H  . tamsulosin  0.4 mg Oral Daily   Continuous: . sodium chloride    . dextrose 5 % and 0.9% NaCl 125 mL/hr at 03/06/17 0702  . feeding supplement (JEVITY 1.2 CAL)     DUK:GURKYH chloride, acetaminophen, albuterol, alum & mag hydroxide-simeth, benzonatate, guaiFENesin, ibuprofen, LORazepam, sodium chloride, sodium chloride flush  Assessment/Plan:  Principal Problem:   Fever Active Problems:   Hyperlipidemia   Hypertension   CKD (chronic kidney disease) stage 3, GFR 30-59 ml/min (HCC)   Rheumatoid arthritis of multiple sites with negative  rheumatoid factor (HCC)   Pulmonary nodules/lesions, multiple   Generalized lymphadenopathy   Unintentional weight loss   Atherosclerotic peripheral vascular disease ([place])   Rash    Acute respiratory failure  with hypoxia Thought to be due to community-acquired pneumonia.  But patient also noted to have widespread lymphadenopathy and pulmonary nodules.  These could have also contributed.  Patient was treated with steroids.  Seen by infectious disease.  Antibiotics have been discontinued.  Patient states that his shortness of breath has improved.  Patient also started on IV steroids which will be continued for now.  Nebulizer treatments.  Continue to monitor closely.  Influenza is negative.  Respiratory viral panel was negative.  Lymphadenopathy with fever and weight loss Imaging study findings were concerning for lymphoma.  Patient underwent lymph node biopsy which showed a lymphoproliferative process but could not offer definitive diagnosis.  Patient underwent excisional lymph node biopsy on 2/18.  Final pathology is pending.  Patient seen by oncology and they are also waiting biopsy reports.  HIV negative.  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   Maculopapular rash Patient was noted to have a maculopapular rash to his bilateral arms and chest and back.  Improved this morning possibly due to steroids.  Etiology unclear.  Poor oral intake with likely moderate to severe protein calorie malnutrition Patient has lost weight as well.  Daughter is very concerned that patient has not been eating and drinking much.  Tube feeds were offered to the patient yesterday but he declined.  Again discussed this issue and he agrees today after being persuaded by daughter.  Tube feedings to be initiated.  Symptoms not suggestive of esophageal dysphagia.  No esophageal abnormality noted on recent CT scan.  Bulky lymphadenopathy could be contributing as well.  Discussed with his ENT who states that this is not a contraindication for feeding tube placement but recommends narrow caliber tube.  Upper extremity swelling/superficial thrombosis No DVT was noted on ultrasound.  Urinary  tract infection/hematuria Completed course of antibiotics.  Urine culture positive for Klebsiella.  Hypotension/tachycardia These issues appear to be stable currently.  Echocardiogram shows EF of 5-70% with grade 1 diastolic dysfunction.  TSH was 6.45 and will need to be repeated in the outpatient setting.  Normocytic anemia Hemoglobin is stable.  Continue to monitor  Leukocytosis Likely due to steroids.  Continue to monitor.  History of rheumatoid arthritis Holding Xelijanz.  Acute kidney injury on chronic kidney disease stage III Creatinine slightly better.  Continue to monitor urine output.  Essential hypertension Continue metoprolol.  History of GERD Continue PPI  History of anxiety Continue Ativan  Deviated nasal septum Seen by ENT.  No acute issue for now.  Outpatient follow-up.  Flonase prescribed.  Ectatic infrarenal abdominal aorta Outpatient follow-up  DVT Prophylaxis: Subcutaneous heparin    Code Status: Full code Family Communication: Discussed with the patient and his daughter Disposition Plan: Management as outlined above.    LOS: 6 days   [facility] Pager [phone] 03/06/2017, 2:16 PM  If 7PM-7AM, please contact night-coverage at [email or web address], password [facility]

## 2017-03-06 NOTE — Progress Notes (Signed)
Called pharmacy for osmolite. Pharmacy says it will be delivered with 6pm delivery. Called again at 648. Pharm said it hasnt come up with 6pm delivery yet. 7:50pm sent message to pharmacy asking them to sent osmolite ASAP.

## 2017-03-06 NOTE — Progress Notes (Signed)
Subjective: Pt c/o significant nasal obstruction. He has a history of severe nasal septal deviation and bilateral inferior turbinate hypertrophy. Currently on O2 via nasal cannula. Now with diffuse lymphadenopathy, possible lymphoma.  Objective: Vital signs in last 24 hours: Temp:  [97.8 F (36.6 C)-98.8 F (37.1 C)] 98.1 F (36.7 C) (02/20 0422) Pulse Rate:  [91-114] 100 (02/20 0422) Resp:  [16-20] 18 (02/20 0422) BP: (100-130)/(50-77) 130/77 (02/20 0422) SpO2:  [91 %-97 %] 96 % (02/20 0422) Weight:  [97.2 kg (214 lb 4.6 oz)-98 kg (216 lb 0.8 oz)] 97.2 kg (214 lb 4.6 oz) (02/20 0702)  General: Communicates without difficulty, well nourished, no acute distress.  Head: Normocephalic, no evidence injury, no tenderness, facial buttresses intact without stepoff.  Eyes: PERRL, EOMI. No scleral icterus, conjunctivae clear. Neuro: CN II exam reveals vision grossly intact.  No nystagmus at any point of gaze.  Ears: Auricles well formed without lesions.  Ear canals are intact without mass or lesion.  No erythema or edema is appreciated. Nose: External evaluation reveals normal support and skin without lesions.  Dorsum is intact.  Anterior rhinoscopy reveals congested and edematous mucosa over anterior aspect of the inferior turbinates and nasal septum.  No purulence is noted. Moderate nasal crusting. Oral:  Oral cavity and oropharynx are intact, symmetric, without erythema or edema.  Mucosa is moist without lesions.  Neck: Full range of motion without pain.  There is no significant lymphadenopathy.  No masses palpable.  Thyroid bed within normal limits to palpation. Trachea is midline.  Neuro:  CN 2-12 grossly intact.   Recent Labs    03/05/17 0348 03/06/17 0523  WBC 13.3* 13.2*  HGB 10.9* 10.8*  HCT 32.6* 32.5*  PLT 175 197   Recent Labs    03/05/17 0348 03/06/17 0523  NA 136 136  K 4.0 4.6  CL 108 109  CO2 19* 21*  GLUCOSE 91 134*  BUN 35* 37*  CREATININE 2.02* 1.85*  CALCIUM  8.3* 8.5*    Medications:  I have reviewed the patient's current medications. Scheduled: . dronabinol  5 mg Oral BID AC  . feeding supplement (ENSURE ENLIVE)  237 mL Oral Q24H  . finasteride  5 mg Oral Daily  . fluticasone  1 spray Each Nare Daily  . heparin injection (subcutaneous)  5,000 Units Subcutaneous Q8H  . levalbuterol  0.63 mg Nebulization TID  . metoprolol tartrate  75 mg Oral BID  . multivitamin with minerals  1 tablet Oral Daily  . pantoprazole  40 mg Oral Daily  . predniSONE  50 mg Oral Q breakfast  . sodium chloride flush  3 mL Intravenous Q12H  . tamsulosin  0.4 mg Oral Daily   Continuous: . sodium chloride    . dextrose 5 % and 0.9% NaCl 125 mL/hr at 03/06/17 0702   VOZ:DGUYQI chloride, acetaminophen, albuterol, alum & mag hydroxide-simeth, benzonatate, guaiFENesin, ibuprofen, LORazepam, sodium chloride, sodium chloride flush  Assessment/Plan: Chronic nasal obstruction, secondary to severe nasal septal deviation and bilateral inferior turbinate hypertrophy. His obstruction is exacerbated by the drying effect of [place] O2. - Continue Flonase nasal sprays and saline irrigation. - Consider switching to flow-by O2 mask - Pt was previously scheduled for septoplasty and turbinate reduction surgery to improve his nasal passageways. Surgery is on hold pending his current medical evaluation and treatment.   LOS: 6 days   Amon Costilla W Rula [place] 03/06/2017, 8:39 AM

## 2017-03-07 ENCOUNTER — Inpatient Hospital Stay (HOSPITAL_COMMUNITY): Payer: Medicare HMO

## 2017-03-07 ENCOUNTER — Telehealth: Payer: Self-pay

## 2017-03-07 DIAGNOSIS — C865 Angioimmunoblastic T-cell lymphoma: Principal | ICD-10-CM

## 2017-03-07 DIAGNOSIS — I10 Essential (primary) hypertension: Secondary | ICD-10-CM

## 2017-03-07 DIAGNOSIS — C8330 Diffuse large B-cell lymphoma, unspecified site: Secondary | ICD-10-CM

## 2017-03-07 DIAGNOSIS — Z95828 Presence of other vascular implants and grafts: Secondary | ICD-10-CM

## 2017-03-07 DIAGNOSIS — E440 Moderate protein-calorie malnutrition: Secondary | ICD-10-CM

## 2017-03-07 LAB — BASIC METABOLIC PANEL
ANION GAP: 7 (ref 5–15)
BUN: 37 mg/dL — ABNORMAL HIGH (ref 6–20)
CO2: 20 mmol/L — ABNORMAL LOW (ref 22–32)
CREATININE: 1.66 mg/dL — AB (ref 0.61–1.24)
Calcium: 8.9 mg/dL (ref 8.9–10.3)
Chloride: 109 mmol/L (ref 101–111)
GFR calc non Af Amer: 37 mL/min — ABNORMAL LOW (ref >60)
GFR, EST AFRICAN AMERICAN: 42 mL/min — AB (ref >60)
Glucose, Bld: 124 mg/dL — ABNORMAL HIGH (ref 65–99)
Potassium: 4.3 mmol/L (ref 3.5–5.1)
SODIUM: 136 mmol/L (ref 135–145)

## 2017-03-07 LAB — CBC
HEMATOCRIT: 34 % — AB (ref 39.0–52.0)
Hemoglobin: 11.1 g/dL — ABNORMAL LOW (ref 13.0–17.0)
MCH: 32.7 pg (ref 26.0–34.0)
MCHC: 32.6 g/dL (ref 30.0–36.0)
MCV: 100.3 fL — ABNORMAL HIGH (ref 78.0–100.0)
Platelets: 196 10*3/uL (ref 150–400)
RBC: 3.39 MIL/uL — AB (ref 4.22–5.81)
RDW: 16.5 % — ABNORMAL HIGH (ref 11.5–15.5)
WBC: 10.8 10*3/uL — AB (ref 4.0–10.5)

## 2017-03-07 LAB — GLUCOSE, CAPILLARY
GLUCOSE-CAPILLARY: 122 mg/dL — AB (ref 65–99)
GLUCOSE-CAPILLARY: 132 mg/dL — AB (ref 65–99)
Glucose-Capillary: 110 mg/dL — ABNORMAL HIGH (ref 65–99)
Glucose-Capillary: 116 mg/dL — ABNORMAL HIGH (ref 65–99)
Glucose-Capillary: 135 mg/dL — ABNORMAL HIGH (ref 65–99)

## 2017-03-07 MED ORDER — PREDNISONE 50 MG PO TABS: 50.0000 mg | ORAL_TABLET | Freq: Every day | ORAL | Status: DC

## 2017-03-07 MED ORDER — LIDOCAINE HCL 1 % IJ SOLN
INTRAMUSCULAR | Status: AC
  Filled 2017-03-07: qty 20

## 2017-03-07 MED ORDER — LIDOCAINE HCL 1 % IJ SOLN
INTRAMUSCULAR | Status: AC | PRN
  Administered 2017-03-07: 5 mL

## 2017-03-07 MED ORDER — LORAZEPAM 1 MG PO TABS
1.0000 mg | ORAL_TABLET | Freq: Every day | ORAL | Status: DC
  Administered 2017-03-07 – 2017-03-13 (×7): 1 mg via ORAL
  Filled 2017-03-07 (×7): qty 1

## 2017-03-07 MED ORDER — PREDNISONE 50 MG PO TABS
50.0000 mg | ORAL_TABLET | Freq: Once | ORAL | Status: AC
  Administered 2017-03-07: 50 mg via ORAL
  Filled 2017-03-07: qty 1

## 2017-03-07 NOTE — Progress Notes (Signed)
Paged from RN - reporting family is not able to locate pt's HCPOA and would like to execute new one.    Chaplain provided support around executing new HCOPA and Living Will.  Pt alert and oriented.  Chaplain notarized document with witnesses.  Copy in pt's chart.  Pt with original and two copies.    [facility] / [place], MDiv

## 2017-03-07 NOTE — Progress Notes (Signed)
Patient ID: Jose Meza, male   DOB: 1931-06-09, 84 y.o.   MRN: [id number]          [facility] Infectious [facility]  Date of Admission:  02/27/2017     ASSESSMENT: His lymph node biopsy revealed T-cell lymphoma.  His fevers have settled down since steroids were started.  I see no current evidence of active infection.  PLAN: 1. Continue observation off of antibiotics 2. I will sign off now  Principal Problem:   Fever Active Problems:   Pulmonary nodules/lesions, multiple   Generalized lymphadenopathy   Unintentional weight loss   Rash   Hyperlipidemia   Hypertension   CKD (chronic kidney disease) stage 3, GFR 30-59 ml/min (HCC)   Rheumatoid arthritis of multiple sites with negative rheumatoid factor (HCC)   Atherosclerotic peripheral vascular disease (HCC)   Angioimmunoblastic lymphoma (HCC)   Scheduled Meds: . allopurinol  50 mg Oral Daily  . dronabinol  5 mg Oral BID AC  . feeding supplement (ENSURE ENLIVE)  237 mL Oral BID BM  . feeding supplement (PRO-STAT SUGAR FREE 64)  30 mL Per Tube Daily  . finasteride  5 mg Oral Daily  . fluticasone  1 spray Each Nare Daily  . heparin injection (subcutaneous)  5,000 Units Subcutaneous Q8H  . levalbuterol  0.63 mg Nebulization TID  . lidocaine      . LORazepam  1 mg Oral QHS  . metoprolol tartrate  75 mg Oral BID  . multivitamin with minerals  1 tablet Oral Daily  . pantoprazole  40 mg Oral Daily  . [START ON 03/08/2017] predniSONE  60 mg Oral Q breakfast  . sodium chloride flush  3 mL Intravenous Q12H  . tamsulosin  0.4 mg Oral Daily   Continuous Infusions: . sodium chloride    . feeding supplement (OSMOLITE 1.5 CAL) 1,000 mL (03/07/17 1542)  . ondansetron (ZOFRAN) IVPB     PRN Meds:.sodium chloride, acetaminophen, albuterol, alum & mag hydroxide-simeth, benzonatate, guaiFENesin, ibuprofen, LORazepam, ondansetron (ZOFRAN) IVPB, sodium chloride, sodium chloride flush   SUBJECTIVE: He tells me that he has  been very busy today.  He says he is worn out from having procedures.  He tells me that 2 procedures were done but I believe he only had a PICC placed.  He is scheduled for a bone marrow biopsy and to start chemotherapy for lymphoma tomorrow.  Review of Systems: Review of Systems  Constitutional: Positive for malaise/fatigue and weight loss. Negative for chills, diaphoresis and fever.  HENT: Positive for congestion. Negative for sore throat.   Respiratory: Positive for cough and shortness of breath. Negative for sputum production.   Cardiovascular: Negative for chest pain.  Gastrointestinal: Negative for abdominal pain, diarrhea, nausea and vomiting.  Genitourinary: Negative for dysuria.  Skin: Positive for itching and rash.    Allergies  Allergen Reactions  . Sulfa Antibiotics Other (See Comments)    Paradoxical effect. Patient states Sulfa does not work for him.    OBJECTIVE: Vitals:   03/07/17 0515 03/07/17 0956 03/07/17 1005 03/07/17 1443  BP: 126/63  133/73   Pulse: 100  (!) 114   Resp: 18     Temp: (!) 97.5 F (36.4 C)  98.2 F (36.8 C)   TempSrc: Oral  Axillary   SpO2: 93% 92% 94% 93%  Weight: 215 lb 9.8 oz (97.8 kg)     Height:       Body mass index is 25.57 kg/m.  Physical Exam  Constitutional: He is  oriented to person, place, and time.  He seems more tired today and may be slightly confused. His daughter is visiting.  Cardiovascular: Normal rate and regular rhythm.  No murmur heard. Pulmonary/Chest: Effort normal. He has no wheezes. He has no rales.  Loose cough.  Abdominal: He exhibits distension.  Neurological: He is alert and oriented to person, place, and time.  Skin: Rash noted.  No change in rash.  Psychiatric: Mood and affect normal.    Lab Results Lab Results  Component Value Date   WBC 10.8 (H) 03/07/2017   HGB 11.1 (L) 03/07/2017   HCT 34.0 (L) 03/07/2017   MCV 100.3 (H) 03/07/2017   PLT 196 03/07/2017    Lab Results  Component Value  Date   CREATININE 1.66 (H) 03/07/2017   BUN 37 (H) 03/07/2017   NA 136 03/07/2017   K 4.3 03/07/2017   CL 109 03/07/2017   CO2 20 (L) 03/07/2017    Lab Results  Component Value Date   ALT 16 (L) 03/06/2017   AST 22 03/06/2017   ALKPHOS 55 03/06/2017   BILITOT 0.6 03/06/2017     Microbiology: Recent Results (from the past 240 hour(s))  Blood culture (routine x 2)     Status: None   Collection Time: 02/27/17  6:00 PM  Result Value Ref Range Status   Specimen Description   Final    BLOOD BLOOD RIGHT HAND Performed at [facility], [address] [place], [place] [address]    Special Requests   Final    BOTTLES DRAWN AEROBIC AND ANAEROBIC Blood Culture adequate volume Performed at [facility], [address] [place], [place] [address]    Culture   Final    NO GROWTH 5 DAYS Performed at [facility], [place] [address], [place], [place] [address]    Report Status 03/04/2017 FINAL  Final  Blood culture (routine x 2)     Status: None   Collection Time: 02/27/17  6:15 PM  Result Value Ref Range Status   Specimen Description   Final    BLOOD LEFT ANTECUBITAL Performed at [facility], [address] [place], [place] [address]    Special Requests   Final    BOTTLES DRAWN AEROBIC AND ANAEROBIC Blood Culture adequate volume Performed at [facility], [address] [place], [place] [address]    Culture   Final    NO GROWTH 5 DAYS Performed at [facility], [place] [address], [place], [place] [address]    Report Status 03/04/2017 FINAL  Final  Urine culture     Status: Abnormal   Collection Time: 02/27/17  7:40 PM  Result Value Ref Range Status   Specimen Description   Final    URINE, RANDOM Performed at [facility], [address] [place], [place] [address]    Special Requests   Final    NONE Performed at [facility], [address] [place], [place] [address]    Culture >=100,000  COLONIES/mL KLEBSIELLA PNEUMONIAE (A)  Final   Report Status 03/02/2017 FINAL  Final   Organism ID, Bacteria KLEBSIELLA PNEUMONIAE (A)  Final      Susceptibility   Klebsiella pneumoniae - MIC*    AMPICILLIN >=32 RESISTANT Resistant     CEFAZOLIN <=4 SENSITIVE Sensitive     CEFTRIAXONE <=1 SENSITIVE Sensitive     CIPROFLOXACIN <=0.25 SENSITIVE Sensitive  GENTAMICIN <=1 SENSITIVE Sensitive     IMIPENEM <=0.25 SENSITIVE Sensitive     NITROFURANTOIN 64 INTERMEDIATE Intermediate     TRIMETH/SULFA <=20 SENSITIVE Sensitive     AMPICILLIN/SULBACTAM 8 SENSITIVE Sensitive     PIP/TAZO <=4 SENSITIVE Sensitive     Extended ESBL NEGATIVE Sensitive     * >=100,000 COLONIES/mL KLEBSIELLA PNEUMONIAE  Respiratory Panel by PCR     Status: None   Collection Time: 02/28/17  4:14 PM  Result Value Ref Range Status   Adenovirus NOT DETECTED NOT DETECTED Final   Coronavirus 229E NOT DETECTED NOT DETECTED Final   Coronavirus HKU1 NOT DETECTED NOT DETECTED Final   Coronavirus NL63 NOT DETECTED NOT DETECTED Final   Coronavirus OC43 NOT DETECTED NOT DETECTED Final   Metapneumovirus NOT DETECTED NOT DETECTED Final   Rhinovirus / Enterovirus NOT DETECTED NOT DETECTED Final   Influenza A NOT DETECTED NOT DETECTED Final   Influenza B NOT DETECTED NOT DETECTED Final   Parainfluenza Virus 1 NOT DETECTED NOT DETECTED Final   Parainfluenza Virus 2 NOT DETECTED NOT DETECTED Final   Parainfluenza Virus 3 NOT DETECTED NOT DETECTED Final   Parainfluenza Virus 4 NOT DETECTED NOT DETECTED Final   Respiratory Syncytial Virus NOT DETECTED NOT DETECTED Final   Bordetella pertussis NOT DETECTED NOT DETECTED Final   Chlamydophila pneumoniae NOT DETECTED NOT DETECTED Final   Mycoplasma pneumoniae NOT DETECTED NOT DETECTED Final    Comment: Performed at [facility], [place] [address] [place], [place] [address]  MRSA PCR Screening     Status: None   Collection Time: 03/01/17  9:40 AM  Result Value Ref Range Status    MRSA by PCR NEGATIVE NEGATIVE Final    Comment:        The GeneXpert MRSA Assay (FDA approved for NASAL specimens only), is one component of a comprehensive MRSA colonization surveillance program. It is not intended to diagnose MRSA infection nor to guide or monitor treatment for MRSA infections. Performed at [facility], [place] [address] [place], [place] [address]     Michel Bickers, [place] for [facility] [phone] pager   [phone] cell 03/07/2017, 4:35 PM

## 2017-03-07 NOTE — Progress Notes (Signed)
NUTRITION NOTE  Full follow-up done by this RD yesterday with associated note at 2:14 PM. Calorie Count information remains unavailable; noted in flow sheet that pt consumed 0% of dinner last night.  NGT placed via L nare yesterday at 5:00 PM.   Order in place for 30 mL Prostat once/day and Osmolite 1.5 @ 30 mL/hr to increase by 10 mL every 8 hours to reach goal rate of Osmolite 1.5 @ 60 mL/hr via NGT. Rate increased from 30 mL/hr to 40 mL today at 6:00 AM. Next rate increase to occur ~2:00 PM. Order also in place for Ensure Enlive BID and Magic Cup BID. Continue to encourage PO intakes.   Labs reviewed; CBGs: 122 and 116 mg/dL this AM, BUN: 37 mg/dL, creatinine: 1.66 mg/dL, GFR: 37 mL/min.   RD will continue to follow per protocol.    Jarome Matin, MS, RD, LDN, [facility] Inpatient Clinical Dietitian Pager # [phone] After hours/weekend pager # [phone]

## 2017-03-07 NOTE — Clinical Social Work Note (Signed)
Clinical Social Work Assessment  Patient Details  Name: Jose Meza MRN: [id number] Date of Birth: 1931-04-08  Date of referral:  03/07/17               Reason for consult:  Facility Placement, Discharge Planning                Permission sought to share information with:  Family Supports Permission granted to share information::  Yes, Verbal Permission Granted  Name::     Jose Meza  Agency::     Relationship::  Daughter/HCPOA  Contact Information:  [phone]  Housing/Transportation Living arrangements for the past 2 months:  [place] of Information:  Patient, Adult Children Patient Interpreter Needed:  None Criminal Activity/Legal Involvement Pertinent to Current Situation/Hospitalization:  No - Comment as needed Significant Relationships:  Adult Children Lives with:  Self Do you feel safe going back to the place where you live?  (PT recommending SNF vs 24 hour care) Need for family participation in patient care:  Yes (Comment)  Care giving concerns:  Patient from home alone. Patient's daughter reported that she lives close to patient and that she has been going over to assist patient with ADLs and uses a walker as needed to assist with ambulation. Patient's daughter reported that patient became weak prior to hospitalization. PT recommending SNF vs 24 hour care.   Social Worker assessment / plan:  CSW spoke with patient/patient's daughter/HCPOA Jose Meza) at bedside regarding PT recommendation for SNF vs 24 hour care. Patient's daughter reported that she would prefer that patient return home with 24 hour care and that patient has a Jose Meza term care plan. Patient's daughter reported that she wanted to discuss discharge options with family and follow up with CSW. CSW explained SNF placement process for rehab and patient's need for prior insurance authorization for rehab,patient's daughter verbalized understanding. Patient's daughter reported that she was  calling patient's Jose Meza term care policy to see what patient's benefits were.   CSW agreed to follow up with patient's daughter regarding discharge planning.  Employment status:  Retired Nurse, adult PT Recommendations:  [place], [place] / Referral to community resources:  [place]  Patient/Family's Response to care:  Patient's daughter appreciative of CSW assistance with discharge planning.   Patient/Family's Understanding of and Emotional Response to Diagnosis, Current Treatment, and Prognosis:  Patient presented quiet and received breathing treatment during assessment. Patient's daughter involved in patient's care and verbalized understanding of patient's current treatment. Patient's daughter reported that patient will be in the hospital for awhile and she wants to see how patient's 1st chemo treatment goes tomorrow. Patient reported that he is not feeling any better since he's been at the hospital. Patient's daughter reported that paitent has a complicated case. CSW provided active listening and emotional support. CSW agreed to follow up with patient/patient's daughter regarding discharge planning.   Emotional Assessment Appearance:  Appears stated age Attitude/Demeanor/Rapport:    Affect (typically observed):  Quiet Orientation:  Oriented to Self, Oriented to Place, Oriented to  Time, Oriented to Situation Alcohol / Substance use:  Not Applicable Psych involvement (Current and /or in the community):  No (Comment)  Discharge Needs  Concerns to be addressed:  Care Coordination Readmission within the last 30 days:  No Current discharge risk:  Physical Impairment, Lives alone Barriers to Discharge:  Continued Medical Work up   [employer], LCSW 03/07/2017, 3:08 PM

## 2017-03-07 NOTE — Procedures (Signed)
Successful placement of dual lumen PICC line to right basilic vein. Length 44 cm Tip at lower SVC/RA No complications Ready for use.   Ascencion Dike PA-C 03/07/2017 2:24 PM

## 2017-03-07 NOTE — Progress Notes (Signed)
[facility] PROGRESS NOTE  Jose Meza YNW:[id number] DOB: 02-20-1931 DOA: 02/27/2017  PCP: Lajean Manes, MD  Brief History/Interval Summary: 84 year old Caucasian male with a past medical history of rheumatoid arthritis chronically on steroids, hypertension was hospitalized due to extensive lymphadenopathy detected on imaging studies.  Patient's symptoms included chills fever weight loss shortness of breath.  Concern was for lymphoma.  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  Reason for Visit: angioimmunoblastic T-cell lymphoma.  Consultants: Oncology.  General surgery.  Procedures:  Transthoracic echocardiogram Study Conclusions  - Procedure narrative: Transthoracic echocardiography. Image   quality was suboptimal. The study was technically difficult, as a   result of body habitus. - Left ventricle: The cavity size was normal. There was moderate   concentric hypertrophy. Systolic function was vigorous. The   estimated ejection fraction was in the range of 65% to 70%. - Aortic valve: Calcified with mild to moderate stenosis and   trivial regurgitation. Mean gradient (S): 13 mm Hg. Peak gradient   (S): 23 mm Hg. Valve area (VTI): 1.56 cm^2. Valve area (Vmax):   1.56 cm^2. Valve area (Vmean): 1.6 cm^2. - Aorta: Dilated aortic root. Aortic root dimension: 41 mm (ED). - Mitral valve: Mildly thickened leaflets . There was trivial   regurgitation. - Left atrium: The atrium was mildly dilated. - Tricuspid valve: There was mild regurgitation. - Pulmonary arteries: PA peak pressure: 54 mm Hg (S). - Inferior vena cava: The vessel was dilated. The respirophasic   diameter changes were blunted (< 50%), consistent with elevated   central venous pressure. - Pericardium, extracardiac: A trivial pericardial effusion was   identified posterior to the heart.  Impressions: - Technically difficult study. LVEF 65-70%, moderate LVH, grossly   normal wall  motion, grade 1 DD, mild to moderate aortic stenosis   - trivial AI, estimated AVA around 1.5 cm2, dilated aortic root   to 4.1 cm, mild TR, RVSP 54 mmHg, dilated IVC, trivial posterior   pericardial effusion.   US Guided Biopsy of L inguinal LN 02/28/17  Excisional Biopsy of L inguinal LN 03/04/17  Feeding tube placement under fluoroscopy  Plan is for a Port-A-Cath placement and bone marrow biopsy 2/21   Antibiotics: Patient has completed a course of antibiotics with ceftriaxone and azithromycin  Subjective/Interval History: Patient states that he is feeling exhausted this morning.  He is not been able to sleep for the last couple of days.  His son is at the bedside.  His rash is improving.  Breathing is better.  Mild discomfort with the feeding tube but overall he is tolerating it well.   ROS: Denies any chest pain  Objective:  Vital Signs  Vitals:   03/06/17 2032 03/06/17 2058 03/07/17 0515 03/07/17 0956  BP:  119/62 126/63   Pulse:  (!) 108 100   Resp:  18 18   Temp:  98 F (36.7 C) (!) 97.5 F (36.4 C)   TempSrc:  Oral Oral   SpO2: 98% 98% 93% 92%  Weight:   97.8 kg (215 lb 9.8 oz)   Height:        Intake/Output Summary (Last 24 hours) at 03/07/2017 1006 Last data filed at 03/07/2017 0600 Gross per 24 hour  Intake 510 ml  Output 1065 ml  Net -555 ml   Filed Weights   03/05/17 1528 03/06/17 0702 03/07/17 0515  Weight: 98 kg (216 lb 0.8 oz) 97.2 kg (214 lb 4.6 oz) 97.8 kg (215 lb 9.8 oz)  General appearance: Awake alert.  In no distress. Resp: Mildly tachypneic.  Diminished air entry at the bases.  No definite wheezing rales or rhonchi. Cardio: S1-S2 is normal regular.  No S3-S4.  No rubs murmurs or bruit GI: Abdomen is soft.  No masses or organomegaly.  Nontender. Extremities: No edema Neurologic: No obvious focal neurological deficits.  Lab Results:  Data Reviewed: I have personally reviewed following labs and imaging studies  CBC: Recent Labs  Lab  03/03/17 0353 03/04/17 0407 03/05/17 0348 03/06/17 0523 03/07/17 0519  WBC 13.1* 12.3* 13.3* 13.2* 10.8*  HGB 11.7* 11.5* 10.9* 10.8* 11.1*  HCT 34.6* 35.4* 32.6* 32.5* 34.0*  MCV 98.3 98.9 98.8 99.4 100.3*  PLT 191 200 175 197 329    Basic Metabolic Panel: Recent Labs  Lab 03/02/17 0338 03/03/17 0353 03/04/17 0407 03/05/17 0348 03/06/17 0523 03/07/17 0519  NA 135 134* 136 136 136 136  K 3.9 4.0 4.0 4.0 4.6 4.3  CL 107 105 107 108 109 109  CO2 19* 21* 21* 19* 21* 20*  GLUCOSE 99 100* 90 91 134* 124*  BUN 26* 26* 32* 35* 37* 37*  CREATININE 1.63* 1.72* 2.03* 2.02* 1.85* 1.66*  CALCIUM 8.4* 8.7* 8.6* 8.3* 8.5* 8.9  MG 2.2 2.1 2.1 2.1 2.3  --     GFR: Estimated Creatinine Clearance: 42.5 mL/min (A) (by C-G formula based on SCr of 1.66 mg/dL (H)).  Liver Function Tests: Recent Labs  Lab 03/04/17 0407 03/05/17 0348 03/06/17 0523  AST 20 20 22   ALT 17 17 16*  ALKPHOS 48 44 55  BILITOT 0.5 0.5 0.6  PROT 6.5 6.0* 6.9  ALBUMIN 2.1* 1.9* 1.9*    CBG: Recent Labs  Lab 03/06/17 1707 03/06/17 2150 03/06/17 2333 03/07/17 0520 03/07/17 0755  GLUCAP 123* 121* 113* 122* 116*    Recent Results (from the past 240 hour(s))  Blood culture (routine x 2)     Status: None   Collection Time: 02/27/17  6:00 PM  Result Value Ref Range Status   Specimen Description   Final    BLOOD BLOOD RIGHT HAND Performed at [facility], [address] [place], [place] [address]    Special Requests   Final    BOTTLES DRAWN AEROBIC AND ANAEROBIC Blood Culture adequate volume Performed at [facility], [address] [place], [place] [address]    Culture   Final    NO GROWTH 5 DAYS Performed at [facility], [place] [address] [place], [place] [address]    Report Status 03/04/2017 FINAL  Final  Blood culture (routine x 2)     Status: None   Collection Time: 02/27/17  6:15 PM  Result Value Ref Range Status   Specimen Description   Final    BLOOD LEFT  ANTECUBITAL Performed at [facility], [address] [place], [place] [address]    Special Requests   Final    BOTTLES DRAWN AEROBIC AND ANAEROBIC Blood Culture adequate volume Performed at [facility], [address] [place], [place] [address]    Culture   Final    NO GROWTH 5 DAYS Performed at [facility], [place] [address], [place], [place] [address]    Report Status 03/04/2017 FINAL  Final  Urine culture     Status: Abnormal   Collection Time: 02/27/17  7:40 PM  Result Value Ref Range Status   Specimen Description   Final  URINE, RANDOM Performed at [facility], [address] [place], [place] [address]    Special Requests   Final    NONE Performed at [facility], [address] [place], [place] [address]    Culture >=100,000 COLONIES/mL KLEBSIELLA PNEUMONIAE (A)  Final   Report Status 03/02/2017 FINAL  Final   Organism ID, Bacteria KLEBSIELLA PNEUMONIAE (A)  Final      Susceptibility   Klebsiella pneumoniae - MIC*    AMPICILLIN >=32 RESISTANT Resistant     CEFAZOLIN <=4 SENSITIVE Sensitive     CEFTRIAXONE <=1 SENSITIVE Sensitive     CIPROFLOXACIN <=0.25 SENSITIVE Sensitive     GENTAMICIN <=1 SENSITIVE Sensitive     IMIPENEM <=0.25 SENSITIVE Sensitive     NITROFURANTOIN 64 INTERMEDIATE Intermediate     TRIMETH/SULFA <=20 SENSITIVE Sensitive     AMPICILLIN/SULBACTAM 8 SENSITIVE Sensitive     PIP/TAZO <=4 SENSITIVE Sensitive     Extended ESBL NEGATIVE Sensitive     * >=100,000 COLONIES/mL KLEBSIELLA PNEUMONIAE  Respiratory Panel by PCR     Status: None   Collection Time: 02/28/17  4:14 PM  Result Value Ref Range Status   Adenovirus NOT DETECTED NOT DETECTED Final   Coronavirus 229E NOT DETECTED NOT DETECTED Final   Coronavirus HKU1 NOT DETECTED NOT DETECTED Final   Coronavirus NL63 NOT DETECTED NOT DETECTED Final   Coronavirus OC43 NOT DETECTED NOT DETECTED Final   Metapneumovirus NOT DETECTED NOT DETECTED  Final   Rhinovirus / Enterovirus NOT DETECTED NOT DETECTED Final   Influenza A NOT DETECTED NOT DETECTED Final   Influenza B NOT DETECTED NOT DETECTED Final   Parainfluenza Virus 1 NOT DETECTED NOT DETECTED Final   Parainfluenza Virus 2 NOT DETECTED NOT DETECTED Final   Parainfluenza Virus 3 NOT DETECTED NOT DETECTED Final   Parainfluenza Virus 4 NOT DETECTED NOT DETECTED Final   Respiratory Syncytial Virus NOT DETECTED NOT DETECTED Final   Bordetella pertussis NOT DETECTED NOT DETECTED Final   Chlamydophila pneumoniae NOT DETECTED NOT DETECTED Final   Mycoplasma pneumoniae NOT DETECTED NOT DETECTED Final    Comment: Performed at [facility], [place]. [address] [place], [place] [address]  MRSA PCR Screening     Status: None   Collection Time: 03/01/17  9:40 AM  Result Value Ref Range Status   MRSA by PCR NEGATIVE NEGATIVE Final    Comment:        The GeneXpert MRSA Assay (FDA approved for NASAL specimens only), is one component of a comprehensive MRSA colonization surveillance program. It is not intended to diagnose MRSA infection nor to guide or monitor treatment for MRSA infections. Performed at [facility], [place] [address] [place], [place] [address]       Radiology Studies: Dg Loyce Dys Tube Plc W/fl W/rad  Result Date: 03/06/2017 CLINICAL DATA:  Malnutrition. Fluoroscopically guided enteric tube placement requested. Reported deviated nasal septum. EXAM: NASO G TUBE PLACEMENT WITH FL AND WITH RAD CONTRAST:  None. FLUOROSCOPY TIME:  Fluoroscopy Time:  1 minute 14 seconds Radiation Exposure Index (if provided by the fluoroscopic device): 40.6 mGy Number of Acquired Spot Images: 0 COMPARISON:  02/27/2017 CT chest, abdomen and pelvis. FINDINGS: Successful right nasal-approach placement of weighted enteric tube under fluoroscopic guidance, with the tip documented in the distal body of the stomach. IMPRESSION: Successful right nasal-approach placement of weighted  enteric tube under fluoroscopic guidance, with the tip documented in the distal body of the stomach.  Electronically Signed   By: Ilona Sorrel M.D.   On: 03/06/2017 16:20     Medications:  Scheduled: . allopurinol  50 mg Oral Daily  . dronabinol  5 mg Oral BID AC  . feeding supplement (ENSURE ENLIVE)  237 mL Oral BID BM  . feeding supplement (PRO-STAT SUGAR FREE 64)  30 mL Per Tube Daily  . finasteride  5 mg Oral Daily  . fluticasone  1 spray Each Nare Daily  . heparin injection (subcutaneous)  5,000 Units Subcutaneous Q8H  . levalbuterol  0.63 mg Nebulization TID  . metoprolol tartrate  75 mg Oral BID  . multivitamin with minerals  1 tablet Oral Daily  . pantoprazole  40 mg Oral Daily  . [START ON 03/08/2017] predniSONE  60 mg Oral Q breakfast  . sodium chloride flush  3 mL Intravenous Q12H  . tamsulosin  0.4 mg Oral Daily   Continuous: . sodium chloride    . feeding supplement (OSMOLITE 1.5 CAL) 1,000 mL (03/07/17 0600)  . ondansetron (ZOFRAN) IVPB     KDT:OIZTIW chloride, acetaminophen, albuterol, alum & mag hydroxide-simeth, benzonatate, guaiFENesin, ibuprofen, LORazepam, ondansetron (ZOFRAN) IVPB, sodium chloride, sodium chloride flush  Assessment/Plan:  Principal Problem:   Fever Active Problems:   Hyperlipidemia   Hypertension   CKD (chronic kidney disease) stage 3, GFR 30-59 ml/min (HCC)   Rheumatoid arthritis of multiple sites with negative rheumatoid factor (HCC)   Pulmonary nodules/lesions, multiple   Generalized lymphadenopathy   Unintentional weight loss   Atherosclerotic peripheral vascular disease (HCC)   Rash   Angioimmunoblastic lymphoma (HCC)    Acute respiratory failure with hypoxia Thought to be due to community-acquired pneumonia.  But patient also noted to have widespread lymphadenopathy and pulmonary nodules.  These could have also contributed.  Patient was started on antibiotics.  Due to persistent fevers patient was seen by infectious disease.   Antibiotics were discontinued.  She was started on steroids after his biopsy was performed.  Respiratory status is stable for the most part.  Continue to monitor for now.  Continue supplemental oxygen.  Nebulizer treatments.  Continue to monitor closely.  Influenza is negative.  Respiratory viral panel was negative.  Angioimmunoblastic T-cell lymphoma, Newly diagnosed Imaging study findings were concerning for lymphoma.  Patient underwent lymph node biopsy which showed a lymphoproliferative process but could not offer definitive diagnosis.  Patient underwent excisional lymph node biopsy on 2/18.  Final pathology suggested angioimmunoblastic T-cell lymphoma.  Oncology is following.  Plan is for Port-A-Cath placement and bone marrow biopsy followed by initiation of chemotherapy tomorrow.  Echocardiogram done a few days ago showed normal systolic function.  HIV negative.  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   Maculopapular rash Patient was noted to have a maculopapular rash to his bilateral arms and chest and back.  Improving most likely due to steroids.  Etiology unclear.  Poor oral intake with likely moderate to severe protein calorie malnutrition Patient has lost weight as well.  Patient's son and daughter very concerned that patient has not been eating and drinking much.  Tube feeds were offered to the patient 2/19 but he declined.  This was again discussed with the patient on 2/20.  Feeding tube was inserted.  He has been started on tube feedings. Symptoms not suggestive of esophageal dysphagia.  No esophageal abnormality noted on recent CT scan.  Bulky lymphadenopathy could be contributing as well.  Discussed with his ENT who states that this is not  a contraindication for feeding tube placement but recommended narrow caliber tube.  Upper extremity swelling/superficial thrombosis No DVT was noted on ultrasound.  Urinary tract  infection/hematuria Completed course of antibiotics. Urine culture positive for Klebsiella.  Hypotension/tachycardia  These issues appear to be stable currently.  TSH was 6.45 and will need to be repeated in the outpatient setting.  Normocytic anemia Hemoglobin is stable.  Continue to monitor  Leukocytosis Likely due to steroids.  Continue to monitor.  History of rheumatoid arthritis Holding Xelijanz.  Acute kidney injury on chronic kidney disease stage III Creatinine slightly better.  Continue to monitor urine output.  Essential hypertension Continue metoprolol.  Blood pressure is stable.  History of GERD Continue PPI  History of anxiety Continue Ativan  Deviated nasal septum Seen by ENT.  No acute issue for now.  Outpatient follow-up.  Flonase.  Ectatic infrarenal abdominal aorta Outpatient follow-up  DVT Prophylaxis: Subcutaneous heparin    Code Status: Full code Family Communication: Discussed with the patient and his son Disposition Plan: Management as outlined above.    LOS: 7 days   [facility] Pager [phone] 03/07/2017, 10:06 AM  If 7PM-7AM, please contact night-coverage at [email or web address], password [facility]

## 2017-03-07 NOTE — Telephone Encounter (Signed)
Called 4E at [facility] and spoke with RN taking care of patient and informed her Dr. Lindi Adie will be doing a bone marrow at 8am in the room and will need to have a bone marrow biopsy kit available and ready in the room.  Cyndia Bent RN

## 2017-03-07 NOTE — Progress Notes (Signed)
SLP Cancellation Note  Patient Details Name: Jose Meza MRN: [id number] DOB: 1931-07-18   Cancelled treatment:        Pt just went down for PICC line. Spoke with daughter who reported he ate more than he has been last night at dinner. Plan was to see today for possible texture upgrade from clear liquids. Noted he is now on full liquids. Daughter would like pt to remain on full liquids especially since he will be starting chemo tomorrow and he may be nauseated and weak. Will plan to see first of next week (d/t chemo starting tomorrow).     Houston Siren 03/07/2017, 2:12 PM   Orbie Pyo Colvin Caroli.Ed [employer] [phone]

## 2017-03-07 NOTE — Progress Notes (Signed)
Checked residual at 0800, 1400. Residual <42ml. Abd distended and soft. but pt denies nausea or GI discomfort. Increased TF 23ml at 1542. Tolerated well throughout shift. Will cont to monitor

## 2017-03-07 NOTE — Progress Notes (Signed)
Physical Therapy Treatment Patient Details Name: Jose Meza MRN: [id number] DOB: 1931/07/19 Today's Date: 03/07/2017    History of Present Illness  84 y.o. male with medical history significant of chronic kidney disease, rheumatoid arthritis chronically on steroids, hypertension transferred here from [facility] after finding diffuse disease in his chest concerning for lymphoma.  S/p excisional lymph node biopsy on 2/18.  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    PT Comments    Limited session today due to found to have BM and needing to be cleaned up.  Just rolling bil and being repositioned up in the bed significantly fatigued him and he was not up for more after.  He also had a PICC line put in earlier and I am sure this process had caused fatigue.  He is willing and wants daily therapy if able as he does want to get stronger.  Daughter in/ot of room during care.   Despite DOE 3/4 VSS on RA.   Follow Up Recommendations  SNF     Equipment Recommendations  None recommended by PT    Recommendations for Other Services   NA     Precautions / Restrictions Precautions Precautions: Fall Precaution Comments: denies falls in past 1 year Required Braces or Orthoses: Other Brace/Splint Other Brace/Splint: nasal feeding tube    Mobility  Bed Mobility Overal bed mobility: Needs Assistance Bed Mobility: Rolling(up to [facility]) Rolling: Mod assist         General bed mobility comments: Mod assist to roll bil for peri care due to active BM.  Pt total assist to scoot to [facility].  DOE with just rolling activity was 3/4.  O2 and HR were stable on RA and tube feeds were placed on hold for bed mobility.    Transfers                 General transfer comment: NT due to pt fatigue            Cognition Arousal/Alertness: Awake/alert Behavior During Therapy: WFL for tasks assessed/performed Overall Cognitive Status: Within Functional Limits for tasks  assessed                                 General Comments: Not specifically tested         General Comments General comments (skin integrity, edema, etc.): Pt fatigued from today's procedure, he did not have the strength to do more after peri care.  He is willing for PT to come back tomorrow to attempt to work again and would like daily.       Pertinent Vitals/Pain Pain Assessment: Faces Faces Pain Scale: Hurts even more Pain Location: generalized, bottom with peri care (use wet wipes from home in cabinent) Pain Descriptors / Indicators: Aching;Burning Pain Intervention(s): Limited activity within patient's tolerance;Monitored during session;Repositioned;Other (comment)(barrier cream applied to peri area)           PT Goals (current goals can now be found in the care plan section) Acute Rehab PT Goals Patient Stated Goal: play bridge Progress towards PT goals: Not progressing toward goals - comment(weak, fatigued, and dyspnic progressively so)    Frequency    Min 2X/week      PT Plan Frequency needs to be updated       AM-PAC PT "6 Clicks" Daily Activity  Outcome Measure  Difficulty turning over in bed (including adjusting bedclothes, sheets and blankets)?: Unable  Difficulty moving from lying on back to sitting on the side of the bed? : Unable Difficulty sitting down on and standing up from a chair with arms (e.g., wheelchair, bedside commode, etc,.)?: Unable Help needed moving to and from a bed to chair (including a wheelchair)?: A Lot Help needed walking in hospital room?: A Lot Help needed climbing 3-5 steps with a railing? : Total 6 Click Score: 8    End of Session   Activity Tolerance: Patient limited by fatigue Patient left: in bed;with call bell/phone within reach;with family/visitor present;with nursing/sitter in room(RN tech in room) Nurse Communication: Mobility status PT Visit Diagnosis: Difficulty in walking, not elsewhere classified  (R26.2);Muscle weakness (generalized) (M62.81)     Time: 6759-1638 PT Time Calculation (min) (ACUTE ONLY): 24 min  Charges:  $Therapeutic Activity: 23-37 mins          Allaya Abbasi B. [place], [name], DPT [phone]            03/07/2017, 4:21 PM

## 2017-03-07 NOTE — Progress Notes (Signed)
PT Cancellation Note  Patient Details Name: Jose Meza MRN: [id number] DOB: 1931/08/04   Cancelled Treatment:    Reason Eval/Treat Not Completed: Patient at procedure or test/unavailable.  Pt out of room.  PT to check back later today or tomorrow as time allows.   Thanks,    Barbarann Ehlers. Keyontay Stolz, PT, DPT [phone]   03/07/2017, 2:22 PM

## 2017-03-07 NOTE — Progress Notes (Signed)
Pt is sleeping. Family requesting staff not to bother pt at this time. Family states pt would like to skip noon CBG and noon marinol.

## 2017-03-08 LAB — CBC
HCT: 35 % — ABNORMAL LOW (ref 39.0–52.0)
HEMOGLOBIN: 11.2 g/dL — AB (ref 13.0–17.0)
MCH: 32.5 pg (ref 26.0–34.0)
MCHC: 32 g/dL (ref 30.0–36.0)
MCV: 101.4 fL — ABNORMAL HIGH (ref 78.0–100.0)
Platelets: 184 10*3/uL (ref 150–400)
RBC: 3.45 MIL/uL — AB (ref 4.22–5.81)
RDW: 16.6 % — ABNORMAL HIGH (ref 11.5–15.5)
WBC: 9.6 10*3/uL (ref 4.0–10.5)

## 2017-03-08 LAB — BASIC METABOLIC PANEL
ANION GAP: 7 (ref 5–15)
BUN: 33 mg/dL — ABNORMAL HIGH (ref 6–20)
CHLORIDE: 106 mmol/L (ref 101–111)
CO2: 21 mmol/L — ABNORMAL LOW (ref 22–32)
Calcium: 9 mg/dL (ref 8.9–10.3)
Creatinine, Ser: 1.52 mg/dL — ABNORMAL HIGH (ref 0.61–1.24)
GFR calc non Af Amer: 41 mL/min — ABNORMAL LOW (ref >60)
GFR, EST AFRICAN AMERICAN: 47 mL/min — AB (ref >60)
Glucose, Bld: 129 mg/dL — ABNORMAL HIGH (ref 65–99)
POTASSIUM: 4.7 mmol/L (ref 3.5–5.1)
Sodium: 134 mmol/L — ABNORMAL LOW (ref 135–145)

## 2017-03-08 LAB — GLUCOSE, CAPILLARY
GLUCOSE-CAPILLARY: 121 mg/dL — AB (ref 65–99)
GLUCOSE-CAPILLARY: 108 mg/dL — AB (ref 65–99)
Glucose-Capillary: 107 mg/dL — ABNORMAL HIGH (ref 65–99)

## 2017-03-08 MED ORDER — HOT PACK MISC ONCOLOGY
1.0000 | Freq: Once | Status: AC | PRN
  Filled 2017-03-08: qty 1

## 2017-03-08 MED ORDER — DOXORUBICIN HCL CHEMO IV INJECTION 2 MG/ML
25.0000 mg/m2 | Freq: Once | INTRAVENOUS | Status: AC
  Administered 2017-03-08: 58 mg via INTRAVENOUS
  Filled 2017-03-08: qty 29

## 2017-03-08 MED ORDER — HEPARIN SOD (PORK) LOCK FLUSH 100 UNIT/ML IV SOLN
250.0000 [IU] | Freq: Once | INTRAVENOUS | Status: DC | PRN
  Filled 2017-03-08: qty 2.5

## 2017-03-08 MED ORDER — COLD PACK MISC ONCOLOGY
1.0000 | Freq: Once | Status: AC | PRN
  Filled 2017-03-08: qty 1

## 2017-03-08 MED ORDER — SODIUM CHLORIDE 0.9% FLUSH
10.0000 mL | INTRAVENOUS | Status: DC | PRN
  Administered 2017-03-09: 10 mL
  Filled 2017-03-08: qty 10

## 2017-03-08 MED ORDER — SODIUM CHLORIDE 0.9 % IV SOLN
10.0000 mg | Freq: Once | INTRAVENOUS | Status: AC
  Administered 2017-03-08: 10 mg via INTRAVENOUS
  Filled 2017-03-08: qty 1

## 2017-03-08 MED ORDER — ALTEPLASE 2 MG IJ SOLR
2.0000 mg | Freq: Once | INTRAMUSCULAR | Status: DC | PRN
  Filled 2017-03-08: qty 2

## 2017-03-08 MED ORDER — SODIUM CHLORIDE 0.9 % IV SOLN
400.0000 mg/m2 | Freq: Once | INTRAVENOUS | Status: AC
  Administered 2017-03-08: 920 mg via INTRAVENOUS
  Filled 2017-03-08: qty 46

## 2017-03-08 MED ORDER — VINCRISTINE SULFATE CHEMO INJECTION 1 MG/ML
2.0000 mg | Freq: Once | INTRAVENOUS | Status: AC
  Administered 2017-03-08: 2 mg via INTRAVENOUS
  Filled 2017-03-08 (×2): qty 2

## 2017-03-08 MED ORDER — SODIUM CHLORIDE 0.9% FLUSH: 3.0000 mL | INTRAVENOUS | Status: DC | PRN

## 2017-03-08 MED ORDER — SODIUM CHLORIDE 0.9 % IV SOLN
Freq: Once | INTRAVENOUS | Status: AC
  Administered 2017-03-08: 14:00:00 via INTRAVENOUS

## 2017-03-08 MED ORDER — HEPARIN SOD (PORK) LOCK FLUSH 100 UNIT/ML IV SOLN
500.0000 [IU] | Freq: Once | INTRAVENOUS | Status: DC | PRN
  Filled 2017-03-08: qty 5

## 2017-03-08 MED ORDER — PALONOSETRON HCL INJECTION 0.25 MG/5ML
0.2500 mg | Freq: Once | INTRAVENOUS | Status: AC
  Administered 2017-03-08: 0.25 mg via INTRAVENOUS
  Filled 2017-03-08 (×2): qty 5

## 2017-03-08 NOTE — Progress Notes (Signed)
IR called after chemo complete for panda placement verification, MD unable to complete scan today. Dr. Maryland Pink paged.

## 2017-03-08 NOTE — Progress Notes (Signed)
Family asked if they could reconsider transferring the patient to third floor to received the chemo.  They have decided that it is in the patient's best interest to receive treatment on that floor.  Dr. Maryland Pink notified, orders received.

## 2017-03-08 NOTE — Progress Notes (Signed)
HEMATOLOGY-ONCOLOGY PROGRESS NOTE  SUBJECTIVE: I met with the patient and his family as his chemotherapy is ready to begin. He appears to be resting comfortably.  His tube feeds have been held until the location of the feeding tube can be confirmed with an x-ray.  Patient has complete understanding of chemotherapy related risks and benefits and has consented to proceed.  OBJECTIVE: REVIEW OF SYSTEMS:   Constitutional: Bedbound and frail Eyes: Denies blurriness of vision Ears, nose, mouth, throat, and face: Feeding tube in place Respiratory: Shortness of breath at rest Cardiovascular: Denies palpitation, chest discomfort Gastrointestinal:  Denies nausea, heartburn or change in bowel habits Skin: Denies abnormal skin rashes Neurological: Severe generalized weaknesses Behavioral/Psych: Mood is stable, no new changes  Extremities: 1+ lower extremity edema All other systems were reviewed with the patient and are negative.   PHYSICAL EXAMINATION: ECOG PERFORMANCE STATUS: 3 - Symptomatic, >50% confined to bed  Vitals:   03/08/17 0322 03/08/17 1033  BP: (!) 151/89   Pulse: (!) 108   Resp: (!) 24   Temp: 98.2 F (36.8 C)   SpO2: 92% 93%   Filed Weights   03/05/17 1528 03/06/17 0702 03/07/17 0515  Weight: 216 lb 0.8 oz (98 kg) 214 lb 4.6 oz (97.2 kg) 215 lb 9.8 oz (97.8 kg)    GENERAL: Frail elderly man EYES: normal, pallor OROPHARYNX: Feeding tube  LYMPH: Inguinal and axillary lymphadenopathies LUNGS: Inspiratory wheeze  HEART: regular rate & rhythm and no murmurs  ABDOMEN:abdomen soft, non-tender and normal bowel sounds Musculoskeletal:no cyanosis of digits and no clubbing  NEURO: alert & oriented x 3 with fluent speech, no focal motor/sensory deficits, generalized weakness  LABORATORY DATA:  I have reviewed the data as listed CMP Latest Ref Rng & Units 03/08/2017 03/07/2017 03/06/2017  Glucose 65 - 99 mg/dL 129(H) 124(H) 134(H)  BUN 6 - 20 mg/dL 33(H) 37(H) 37(H)  Creatinine  0.61 - 1.24 mg/dL 1.52(H) 1.66(H) 1.85(H)  Sodium 135 - 145 mmol/L 134(L) 136 136  Potassium 3.5 - 5.1 mmol/L 4.7 4.3 4.6  Chloride 101 - 111 mmol/L 106 109 109  CO2 22 - 32 mmol/L 21(L) 20(L) 21(L)  Calcium 8.9 - 10.3 mg/dL 9.0 8.9 8.5(L)  Total Protein 6.5 - 8.1 g/dL - - 6.9  Total Bilirubin 0.3 - 1.2 mg/dL - - 0.6  Alkaline Phos 38 - 126 U/L - - 55  AST 15 - 41 U/L - - 22  ALT 17 - 63 U/L - - 16(L)    Lab Results  Component Value Date   WBC 9.6 03/08/2017   HGB 11.2 (L) 03/08/2017   HCT 35.0 (L) 03/08/2017   MCV 101.4 (H) 03/08/2017   PLT 184 03/08/2017   NEUTROABS 7.8 (H) 02/28/2017    ASSESSMENT AND PLAN: 1.  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  They refused bone marrow biopsy this morning.  He is starting mini CHOP chemotherapy.  Dose of cyclophosphamide and Adriamycin have been reduced given his advanced age. I am concerned about tumor lysis.  We will monitor his electrolytes and renal function and uric acid levels. He is also on prednisone therapy.  2. family discussions: Patient has 2 daughters and a son.  The son and the daughter who live here fully understand the plan and have agreed.  Patient's another daughter lives in [place] and is coming this weekend.  She believes in holistic medicine and may have multiple objections/concerns about her treatment plans.  After 30 minutes of explanation on  the telephone, patient daughter from [place] was also on board with his chemotherapy.  I instructed the family that it is the patient's opinion that we have to honor.  Given his advanced age and his general ill health, this is a high risk chemotherapy.  I have made sure that everyone understands this. Dr. Jana Hakim will round on the patient over the weekend.  The family is aware.

## 2017-03-08 NOTE — Progress Notes (Signed)
Pt tolerated doxorubicin, vincristine and cyclophosphamide well with no complications. Daughter at bedside. No complaints at this time. Will continue to monitor.

## 2017-03-08 NOTE — Progress Notes (Signed)
Hematology oncology  Family discussion: I spent 30 minutes discussing the treatment plan. Patient's daughter who lives out of state called in asking [place] to delay his chemotherapy till Monday.  Her opinion was that he has stress and anxiety and she wanted him to be more relaxed before starting chemo. I informed the family that, we would also not want to do chemotherapy in such a week condition but I do not believe that he will get any stronger by waiting for a few more days.  I respectfully informed the daughter that waiting would jeopardize his health further.  The reason why he is so stressed is because he is unable to breathe.  If the lymphoma continues to grow it could close his airway putting his life in jeopardy.  They did not want me to do a bone marrow biopsy today.  I agreed to this.  I informed them that we are giving the patient what is referred to as mini CHOP which is reduced the dosage of both cyclophosphamide and doxorubicin.  The patient's son and daughter who are living here are completely in agreement with the chemotherapy. After our long discussion, even the daughter who was living out of state was in agreement. Patient had already given his consent verbally previously after hearing the risks and benefits of chemotherapy.   I am coordinating with pharmacy.  He will likely need chemotherapy with monitoring

## 2017-03-08 NOTE — Progress Notes (Signed)
PT Cancellation Note  Patient Details Name: Jose Meza MRN: [id number] DOB: 04-25-1931   Cancelled Treatment:    Reason Eval/Treat Not Completed: Other (comment) Pt to start chemo today and likely transfer to 3rd floor unit.  RN reports to hold therapy today.   Adaley Kiene,KATHrine E 03/08/2017, 12:55 PM Carmelia Bake, PT, DPT 03/08/2017 Pager: [phone]

## 2017-03-08 NOTE — Care Management Note (Signed)
Case Management Note  Patient Details  Name: Jose Meza MRN: [id number] Date of Birth: [birth year]  Subjective/Objective:Spoke to dtr about d/c plans-currently she prefers home w/HHC-[facility] already active-rep Ruch following-would recc-HHRN/PT/OT/aide/sw. Dtr interested in [facility]-[employer] LTC insurance-I have asked [facility] rep Lattie Haw to check into custodial level care under [place].                    Action/Plan:d/c plan home w/HHC.   Expected Discharge Date:  02/28/17               Expected Discharge Plan:  [place]  In-House Referral:     Discharge planning Services  CM Consult  Post Acute Care Choice:  Durable Medical Equipment, Home Health(Has rw;Active w/[facility]-HHPT) Choice offered to:     DME Arranged:    DME Agency:     HH Arranged:  PT HH Agency:  [facility] (formerly [employer])  Status of Service:  In process, will continue to follow  If discussed at Long Length of Stay Meetings, dates discussed:    Additional Comments:  Dessa Phi, RN 03/08/2017, 12:42 PM

## 2017-03-08 NOTE — Progress Notes (Signed)
[facility] PROGRESS NOTE  Jose Meza MCN:[id number] DOB: 22-Sep-1931 DOA: 02/27/2017  PCP: Lajean Manes, MD  Brief History/Interval Summary: 84 year old Caucasian male with a past medical history of rheumatoid arthritis chronically on steroids, hypertension was hospitalized due to extensive lymphadenopathy detected on imaging studies.  Patient's symptoms included chills fever weight loss shortness of breath.  Concern was for lymphoma.  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   Reason for Visit: angioimmunoblastic T-cell lymphoma.  Consultants: Oncology.  General surgery.  Procedures:  Transthoracic echocardiogram Study Conclusions  - Procedure narrative: Transthoracic echocardiography. Image   quality was suboptimal. The study was technically difficult, as a   result of body habitus. - Left ventricle: The cavity size was normal. There was moderate   concentric hypertrophy. Systolic function was vigorous. The   estimated ejection fraction was in the range of 65% to 70%. - Aortic valve: Calcified with mild to moderate stenosis and   trivial regurgitation. Mean gradient (S): 13 mm Hg. Peak gradient   (S): 23 mm Hg. Valve area (VTI): 1.56 cm^2. Valve area (Vmax):   1.56 cm^2. Valve area (Vmean): 1.6 cm^2. - Aorta: Dilated aortic root. Aortic root dimension: 41 mm (ED). - Mitral valve: Mildly thickened leaflets . There was trivial   regurgitation. - Left atrium: The atrium was mildly dilated. - Tricuspid valve: There was mild regurgitation. - Pulmonary arteries: PA peak pressure: 54 mm Hg (S). - Inferior vena cava: The vessel was dilated. The respirophasic   diameter changes were blunted (< 50%), consistent with elevated   central venous pressure. - Pericardium, extracardiac: A trivial pericardial effusion was   identified posterior to the heart.  Impressions: - Technically difficult study. LVEF 65-70%, moderate LVH, grossly   normal wall  motion, grade 1 DD, mild to moderate aortic stenosis   - trivial AI, estimated AVA around 1.5 cm2, dilated aortic root   to 4.1 cm, mild TR, RVSP 54 mmHg, dilated IVC, trivial posterior   pericardial effusion.  US Guided Biopsy of L inguinal LN 02/28/17  Excisional Biopsy of L inguinal LN 03/04/17  Feeding tube placement under fluoroscopy  Plan is for a Port-A-Cath placement and bone marrow biopsy 2/21   Antibiotics: Patient has completed a course of antibiotics with ceftriaxone and azithromycin  Subjective/Interval History: Patient denies any new complaints.  Feeding has been stopped as his feeding tube has displaced.  Patient's daughter is at the bedside.   Objective:  Vital Signs  Vitals:   03/07/17 1443 03/07/17 2002 03/07/17 2049 03/08/17 0322  BP:   137/63 (!) 151/89  Pulse:   (!) 110 (!) 108  Resp:   18 (!) 24  Temp:   97.8 F (36.6 C) 98.2 F (36.8 C)  TempSrc:   Oral Axillary  SpO2: 93% 93% 93% 92%  Weight:      Height:        Intake/Output Summary (Last 24 hours) at 03/08/2017 1001 Last data filed at 03/07/2017 1840 Gross per 24 hour  Intake 1340 ml  Output 200 ml  Net 1140 ml   Filed Weights   03/05/17 1528 03/06/17 0702 03/07/17 0515  Weight: 98 kg (216 lb 0.8 oz) 97.2 kg (214 lb 4.6 oz) 97.8 kg (215 lb 9.8 oz)    General appearance: Awake alert.  In no distress Resp: Mildly tachypneic.  Diminished air entry at the bases.  No wheezing rales or rhonchi. Cardio: S1-S2 is normal regular.  No S3-S4.  No rubs murmurs or bruit GI:  Soft.  Nontender nondistended Extremities: No edema Neurologic: No obvious focal neurological deficits.  Lab Results:  Data Reviewed: I have personally reviewed following labs and imaging studies  CBC: Recent Labs  Lab 03/04/17 0407 03/05/17 0348 03/06/17 0523 03/07/17 0519 03/08/17 0517  WBC 12.3* 13.3* 13.2* 10.8* 9.6  HGB 11.5* 10.9* 10.8* 11.1* 11.2*  HCT 35.4* 32.6* 32.5* 34.0* 35.0*  MCV 98.9 98.8 99.4 100.3*  101.4*  PLT 200 175 197 196 811    Basic Metabolic Panel: Recent Labs  Lab 03/02/17 0338 03/03/17 0353 03/04/17 0407 03/05/17 0348 03/06/17 0523 03/07/17 0519 03/08/17 0517  NA 135 134* 136 136 136 136 134*  K 3.9 4.0 4.0 4.0 4.6 4.3 4.7  CL 107 105 107 108 109 109 106  CO2 19* 21* 21* 19* 21* 20* 21*  GLUCOSE 99 100* 90 91 134* 124* 129*  BUN 26* 26* 32* 35* 37* 37* 33*  CREATININE 1.63* 1.72* 2.03* 2.02* 1.85* 1.66* 1.52*  CALCIUM 8.4* 8.7* 8.6* 8.3* 8.5* 8.9 9.0  MG 2.2 2.1 2.1 2.1 2.3  --   --     GFR: Estimated Creatinine Clearance: 46.4 mL/min (A) (by C-G formula based on SCr of 1.52 mg/dL (H)).  Liver Function Tests: Recent Labs  Lab 03/04/17 0407 03/05/17 0348 03/06/17 0523  AST 20 20 22   ALT 17 17 16*  ALKPHOS 48 44 55  BILITOT 0.5 0.5 0.6  PROT 6.5 6.0* 6.9  ALBUMIN 2.1* 1.9* 1.9*    CBG: Recent Labs  Lab 03/07/17 0755 03/07/17 1701 03/07/17 2046 03/07/17 2352 03/08/17 0321  GLUCAP 116* 132* 110* 135* 107*    Recent Results (from the past 240 hour(s))  Blood culture (routine x 2)     Status: None   Collection Time: 02/27/17  6:00 PM  Result Value Ref Range Status   Specimen Description   Final    BLOOD BLOOD RIGHT HAND Performed at [facility], [address] [place], [place] [address]    Special Requests   Final    BOTTLES DRAWN AEROBIC AND ANAEROBIC Blood Culture adequate volume Performed at [facility], [address] [place], [place] [address]    Culture   Final    NO GROWTH 5 DAYS Performed at [facility], [place] [address], [place], [place] [address]    Report Status 03/04/2017 FINAL  Final  Blood culture (routine x 2)     Status: None   Collection Time: 02/27/17  6:15 PM  Result Value Ref Range Status   Specimen Description   Final    BLOOD LEFT ANTECUBITAL Performed at [facility], [address] [place], [place] [address]    Special Requests   Final    BOTTLES DRAWN AEROBIC AND  ANAEROBIC Blood Culture adequate volume Performed at [facility], [address] [place], [place] [address]    Culture   Final    NO GROWTH 5 DAYS Performed at [facility], [place] [address] [place], [place] [address]    Report Status 03/04/2017 FINAL  Final  Urine culture     Status: Abnormal   Collection Time: 02/27/17  7:40 PM  Result Value Ref Range Status   Specimen Description   Final    URINE, RANDOM Performed at [facility], [address] [place], [place] [address]    Special Requests   Final    NONE Performed at [facility], [address] [place], [place] [address]    Culture >=100,000 COLONIES/mL KLEBSIELLA PNEUMONIAE (A)  Final   Report Status 03/02/2017 FINAL  Final   Organism ID, Bacteria KLEBSIELLA PNEUMONIAE (A)  Final      Susceptibility   Klebsiella pneumoniae - MIC*    AMPICILLIN >=32 RESISTANT Resistant     CEFAZOLIN <=4 SENSITIVE Sensitive     CEFTRIAXONE <=1 SENSITIVE Sensitive     CIPROFLOXACIN <=0.25 SENSITIVE Sensitive     GENTAMICIN <=1 SENSITIVE Sensitive     IMIPENEM <=0.25 SENSITIVE Sensitive     NITROFURANTOIN 64 INTERMEDIATE Intermediate     TRIMETH/SULFA <=20 SENSITIVE Sensitive     AMPICILLIN/SULBACTAM 8 SENSITIVE Sensitive     PIP/TAZO <=4 SENSITIVE Sensitive     Extended ESBL NEGATIVE Sensitive     * >=100,000 COLONIES/mL KLEBSIELLA PNEUMONIAE  Respiratory Panel by PCR     Status: None   Collection Time: 02/28/17  4:14 PM  Result Value Ref Range Status   Adenovirus NOT DETECTED NOT DETECTED Final   Coronavirus 229E NOT DETECTED NOT DETECTED Final   Coronavirus HKU1 NOT DETECTED NOT DETECTED Final   Coronavirus NL63 NOT DETECTED NOT DETECTED Final   Coronavirus OC43 NOT DETECTED NOT DETECTED Final   Metapneumovirus NOT DETECTED NOT DETECTED Final   Rhinovirus / Enterovirus NOT DETECTED NOT DETECTED Final   Influenza A NOT DETECTED NOT DETECTED Final   Influenza B NOT DETECTED NOT DETECTED  Final   Parainfluenza Virus 1 NOT DETECTED NOT DETECTED Final   Parainfluenza Virus 2 NOT DETECTED NOT DETECTED Final   Parainfluenza Virus 3 NOT DETECTED NOT DETECTED Final   Parainfluenza Virus 4 NOT DETECTED NOT DETECTED Final   Respiratory Syncytial Virus NOT DETECTED NOT DETECTED Final   Bordetella pertussis NOT DETECTED NOT DETECTED Final   Chlamydophila pneumoniae NOT DETECTED NOT DETECTED Final   Mycoplasma pneumoniae NOT DETECTED NOT DETECTED Final    Comment: Performed at [facility], [place]. [address], [place], [place] [address]  MRSA PCR Screening     Status: None   Collection Time: 03/01/17  9:40 AM  Result Value Ref Range Status   MRSA by PCR NEGATIVE NEGATIVE Final    Comment:        The GeneXpert MRSA Assay (FDA approved for NASAL specimens only), is one component of a comprehensive MRSA colonization surveillance program. It is not intended to diagnose MRSA infection nor to guide or monitor treatment for MRSA infections. Performed at [facility], [place] [address] [place], [place] [address]       Radiology Studies: Dg Abd Portable 1v  Result Date: 03/08/2017 CLINICAL DATA:  84 year old male with NG tube placement EXAM: PORTABLE ABDOMEN - 1 VIEW COMPARISON:  Fluoroscopy dated 03/06/2017 FINDINGS: An enteric tube is not visualized on the provided image. Right-sided PICC with tip in the region of the cavoatrial junction noted. There are bilateral pleural effusions and bilateral lower lung field atelectasis versus infiltrate. Diffuse interstitial and nodular densities noted in the visualized lungs. The cardiac borders are silhouetted. There is degenerative changes of the spine. IMPRESSION: 1. Nonvisualization of the enteric tube. 2. Right-sided PICC with tip in the region of the cavoatrial junction. 3. Bilateral pleural effusions and bilateral mid to lower lung field atelectasis versus infiltrate. Clinical correlation is recommended. Electronically  Signed   By: Anner Crete M.D.   On: 03/08/2017 00:29   Dg Loyce Dys Tube Plc W/fl W/rad  Result Date: 03/06/2017  CLINICAL DATA:  Malnutrition. Fluoroscopically guided enteric tube placement requested. Reported deviated nasal septum. EXAM: NASO G TUBE PLACEMENT WITH FL AND WITH RAD CONTRAST:  None. FLUOROSCOPY TIME:  Fluoroscopy Time:  1 minute 14 seconds Radiation Exposure Index (if provided by the fluoroscopic device): 40.6 mGy Number of Acquired Spot Images: 0 COMPARISON:  02/27/2017 CT chest, abdomen and pelvis. FINDINGS: Successful right nasal-approach placement of weighted enteric tube under fluoroscopic guidance, with the tip documented in the distal body of the stomach. IMPRESSION: Successful right nasal-approach placement of weighted enteric tube under fluoroscopic guidance, with the tip documented in the distal body of the stomach. Electronically Signed   By: Ilona Sorrel M.D.   On: 03/06/2017 16:20   Ir Picc Placement Right >5 Yrs Inc Img Guide  Result Date: 03/07/2017 INDICATION: Lymphoma. Request for durable IV access to initiate chemotherapy. PICC line requested. EXAM: RIGHT UPPER EXTREMITY PICC LINE PLACEMENT WITH ULTRASOUND AND FLUOROSCOPIC GUIDANCE MEDICATIONS: None; ANESTHESIA/SEDATION: Moderate Sedation Time:  None The patient was continuously monitored during the procedure by the interventional radiology nurse under my direct supervision. FLUOROSCOPY TIME:  Fluoroscopy Time: 18 seconds COMPLICATIONS: None immediate. PROCEDURE: The patient was advised of the possible risks and complications and agreed to undergo the procedure. The patient was then brought to the angiographic suite for the procedure. The right arm was prepped with chlorhexidine, draped in the usual sterile fashion using maximum barrier technique (cap and mask, sterile gown, sterile gloves, large sterile sheet, hand hygiene and cutaneous antiseptic). Local anesthesia was attained by infiltration with 1% lidocaine.  Ultrasound demonstrated patency of the basilic vein, and this was documented with an image. Under real-time ultrasound guidance, this vein was accessed with a 21 gauge micropuncture needle and image documentation was performed. The needle was exchanged over a guidewire for a peel-away sheath through which a 44 cm 5 [place] dual lumen power injectable PICC was advanced, and positioned with its tip at the lower SVC/right atrial junction. Fluoroscopy during the procedure and fluoro spot radiograph confirms appropriate catheter position. The catheter was flushed, secured to the skin, and covered with a sterile dressing. IMPRESSION: Successful placement of a right arm PICC with sonographic and fluoroscopic guidance. The catheter is ready for use. Read by: Ascencion Dike PA-C Electronically Signed   By: Marybelle Killings M.D.   On: 03/07/2017 16:55     Medications:  Scheduled: . allopurinol  50 mg Oral Daily  . cyclophosphamide  400 mg/m2 (Treatment Plan Recorded) Intravenous Once  . DOXOrubicin  25 mg/m2 (Treatment Plan Recorded) Intravenous Once  . dronabinol  5 mg Oral BID AC  . feeding supplement (ENSURE ENLIVE)  237 mL Oral BID BM  . feeding supplement (PRO-STAT SUGAR FREE 64)  30 mL Per Tube Daily  . finasteride  5 mg Oral Daily  . fluticasone  1 spray Each Nare Daily  . heparin injection (subcutaneous)  5,000 Units Subcutaneous Q8H  . levalbuterol  0.63 mg Nebulization TID  . LORazepam  1 mg Oral QHS  . metoprolol tartrate  75 mg Oral BID  . multivitamin with minerals  1 tablet Oral Daily  . palonosetron  0.25 mg Intravenous Once  . pantoprazole  40 mg Oral Daily  . predniSONE  60 mg Oral Q breakfast  . sodium chloride flush  3 mL Intravenous Q12H  . tamsulosin  0.4 mg Oral Daily  . vinCRIStine (ONCOVIN) CHEMO IV infusion  2 mg Intravenous Once   Continuous: . sodium chloride    . sodium  chloride    . dexamethasone (DECADRON) IVPB [facility]    . feeding supplement (OSMOLITE 1.5 CAL) 1,000 mL  (03/07/17 1542)  . ondansetron (ZOFRAN) IVPB     VOZ:DGUYQI chloride, acetaminophen, albuterol, alteplase, alum & mag hydroxide-simeth, benzonatate, Cold Pack, guaiFENesin, heparin lock flush, heparin lock flush, Hot Pack, ibuprofen, LORazepam, ondansetron (ZOFRAN) IVPB, sodium chloride, sodium chloride flush, sodium chloride flush, sodium chloride flush  Assessment/Plan:  Principal Problem:   Angioimmunoblastic lymphoma (HCC) Active Problems:   Hyperlipidemia   Hypertension   CKD (chronic kidney disease) stage 3, GFR 30-59 ml/min (HCC)   Rheumatoid arthritis of multiple sites with negative rheumatoid factor (HCC)   Pulmonary nodules/lesions, multiple   Generalized lymphadenopathy   Unintentional weight loss   Fever   Atherosclerotic peripheral vascular disease (HCC)   Rash    Acute respiratory failure with hypoxia Thought to be due to community-acquired pneumonia.  But patient also noted to have widespread lymphadenopathy and pulmonary nodules.  These could have also contributed.  Patient was started on antibiotics.  Due to persistent fevers patient was seen by infectious disease.  Antibiotics were discontinued.  Patient was started on steroids after biopsy was performed.  Respiratory status remains stable.  Continue supplemental oxygen, nebulizer treatments.  Influenza PCR and respiratory viral panel was negative.   Angioimmunoblastic T-cell lymphoma, Newly diagnosed Imaging study findings were concerning for lymphoma.  Patient underwent lymph node biopsy which showed a lymphoproliferative process but could not offer definitive diagnosis.  Patient underwent excisional lymph node biopsy on 2/18.  Final pathology suggested angioimmunoblastic T-cell lymphoma.  Oncology is following.  Plan is for chemotherapy to be given today.  Plan has been placed.  Bone marrow biopsy was scheduled for today but based on family request oncology has postponed this for now.  Echocardiogram done a few days  ago showed normal systolic function.  HIV negative.  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   Maculopapular rash Patient was noted to have a maculopapular rash to his bilateral arms and chest and back.  Improving most likely due to steroids.  Etiology unclear.  Poor oral intake with likely moderate to severe protein calorie malnutrition Patient has lost weight as well.  Patient's son and daughter very concerned that patient has not been eating and drinking much.  Tube feeds were offered to the patient 2/19 but he declined.  This was again discussed with the patient on 2/20.  Feeding tube was inserted he was started on tube feedings.  However it looks like overnight feeding tube has displaced.  Nurse to contact radiology to help reposition.  Feedings are on hold for now.  Upper extremity swelling/superficial thrombosis No DVT was noted on ultrasound.  Urinary tract infection/hematuria Urine culture positive for Klebsiella. Completed course of antibiotics.  Hypotension/tachycardia  These issues appear to be stable currently.  TSH was 6.45 and will need to be repeated in the outpatient setting.  Normocytic anemia Hemoglobin is stable.  Continue to monitor  Leukocytosis Likely due to steroids.  Continue to monitor.  History of rheumatoid arthritis Holding Xelijanz.  Acute kidney injury on chronic kidney disease stage III Creatinine continues to improve.  Monitor urine output.  Essential hypertension Continue metoprolol.  Blood pressure is stable.  History of GERD Continue PPI  History of anxiety Continue Ativan  Deviated nasal septum Seen by ENT.  No acute issue for now.  Outpatient follow-up.  Flonase.  Ectatic infrarenal abdominal aorta Outpatient follow-up  DVT Prophylaxis: Subcutaneous heparin  Code Status: Full code Family Communication: Discussed with the patient and his daughter Disposition Plan: Management as outlined  above.    LOS: 8 days   [facility] Pager [phone] 03/08/2017, 10:01 AM  If 7PM-7AM, please contact night-coverage at [email or web address], password [facility]

## 2017-03-08 NOTE — Progress Notes (Signed)
Doxorubicin, vincristine, cyclophosphamide dosages and calculations verified with Laural Benes, RN.

## 2017-03-09 ENCOUNTER — Inpatient Hospital Stay (HOSPITAL_COMMUNITY): Payer: Medicare HMO

## 2017-03-09 ENCOUNTER — Other Ambulatory Visit: Payer: Self-pay | Admitting: Oncology

## 2017-03-09 LAB — COMPREHENSIVE METABOLIC PANEL
ALBUMIN: 1.9 g/dL — AB (ref 3.5–5.0)
ALT: 22 U/L (ref 17–63)
AST: 19 U/L (ref 15–41)
Alkaline Phosphatase: 54 U/L (ref 38–126)
Anion gap: 9 (ref 5–15)
BUN: 40 mg/dL — ABNORMAL HIGH (ref 6–20)
CHLORIDE: 105 mmol/L (ref 101–111)
CO2: 23 mmol/L (ref 22–32)
CREATININE: 1.57 mg/dL — AB (ref 0.61–1.24)
Calcium: 8.4 mg/dL — ABNORMAL LOW (ref 8.9–10.3)
GFR calc Af Amer: 45 mL/min — ABNORMAL LOW (ref >60)
GFR, EST NON AFRICAN AMERICAN: 39 mL/min — AB (ref >60)
Glucose, Bld: 92 mg/dL (ref 65–99)
Potassium: 4.6 mmol/L (ref 3.5–5.1)
Sodium: 137 mmol/L (ref 135–145)
Total Bilirubin: 0.4 mg/dL (ref 0.3–1.2)
Total Protein: 6.9 g/dL (ref 6.5–8.1)

## 2017-03-09 LAB — GLUCOSE, CAPILLARY
GLUCOSE-CAPILLARY: 107 mg/dL — AB (ref 65–99)
Glucose-Capillary: 87 mg/dL (ref 65–99)
Glucose-Capillary: 83 mg/dL (ref 65–99)
Glucose-Capillary: 92 mg/dL (ref 65–99)
Glucose-Capillary: 117 mg/dL — ABNORMAL HIGH (ref 65–99)

## 2017-03-09 LAB — CBC
HEMATOCRIT: 31.2 % — AB (ref 39.0–52.0)
Hemoglobin: 10.1 g/dL — ABNORMAL LOW (ref 13.0–17.0)
MCH: 32.7 pg (ref 26.0–34.0)
MCHC: 32.4 g/dL (ref 30.0–36.0)
MCV: 101 fL — AB (ref 78.0–100.0)
PLATELETS: 181 10*3/uL (ref 150–400)
RBC: 3.09 MIL/uL — AB (ref 4.22–5.81)
RDW: 16.5 % — ABNORMAL HIGH (ref 11.5–15.5)
WBC: 12.5 10*3/uL — ABNORMAL HIGH (ref 4.0–10.5)

## 2017-03-09 LAB — URIC ACID: URIC ACID, SERUM: 8 mg/dL — AB (ref 4.4–7.6)

## 2017-03-09 MED ORDER — DIPHENHYDRAMINE-ZINC ACETATE 2-0.1 % EX CREA
TOPICAL_CREAM | Freq: Two times a day (BID) | CUTANEOUS | Status: DC | PRN
  Administered 2017-03-10: 1 via TOPICAL
  Filled 2017-03-09: qty 28

## 2017-03-09 MED ORDER — LIDOCAINE HCL 2 % EX GEL
CUTANEOUS | Status: AC
  Administered 2017-03-09: 11:00:00 via NASAL
  Filled 2017-03-09: qty 30

## 2017-03-09 MED ORDER — SODIUM CHLORIDE 0.45 % IV SOLN
INTRAVENOUS | Status: AC
  Administered 2017-03-09 – 2017-03-10 (×2): via INTRAVENOUS

## 2017-03-09 MED ORDER — LEVALBUTEROL HCL 0.63 MG/3ML IN NEBU
0.6300 mg | INHALATION_SOLUTION | Freq: Two times a day (BID) | RESPIRATORY_TRACT | Status: DC
  Administered 2017-03-09 – 2017-03-10 (×2): 0.63 mg via RESPIRATORY_TRACT
  Filled 2017-03-09 (×2): qty 3

## 2017-03-09 MED ORDER — ZINC OXIDE 40 % EX OINT
TOPICAL_OINTMENT | Freq: Two times a day (BID) | CUTANEOUS | Status: DC
  Administered 2017-03-09 (×2): via TOPICAL
  Administered 2017-03-10: 1 via TOPICAL
  Administered 2017-03-10 – 2017-03-12 (×4): via TOPICAL
  Administered 2017-03-13: 1 via TOPICAL
  Filled 2017-03-09 (×2): qty 57

## 2017-03-09 MED ORDER — IOPAMIDOL (ISOVUE-300) INJECTION 61%
INTRAVENOUS | Status: AC
  Administered 2017-03-09: 40 mL via NASOGASTRIC
  Filled 2017-03-09: qty 50

## 2017-03-09 MED ORDER — IOPAMIDOL (ISOVUE-300) INJECTION 61%: 50.0000 mL | Freq: Once | INTRAVENOUS | Status: DC | PRN

## 2017-03-09 MED ORDER — BUTAMBEN-TETRACAINE-BENZOCAINE 2-2-14 % EX AERO
INHALATION_SPRAY | CUTANEOUS | Status: AC
  Filled 2017-03-09: qty 20

## 2017-03-09 NOTE — Progress Notes (Signed)
[facility] PROGRESS NOTE  Jose Meza IHK:[id number] DOB: 1931-05-12 DOA: 02/27/2017  PCP: Lajean Manes, MD  Brief History/Interval Summary: 84 year old Caucasian male with a past medical history of rheumatoid arthritis chronically on steroids, hypertension was hospitalized due to extensive lymphadenopathy detected on imaging studies.  Patient's symptoms included chills fever weight loss shortness of breath.  Concern was for lymphoma.  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  Patient was given chemotherapeutic agents on 2/22.  Reason for Visit: angioimmunoblastic T-cell lymphoma.  Consultants: Oncology.  General surgery.  Procedures:  Transthoracic echocardiogram Study Conclusions  - Procedure narrative: Transthoracic echocardiography. Image   quality was suboptimal. The study was technically difficult, as a   result of body habitus. - Left ventricle: The cavity size was normal. There was moderate   concentric hypertrophy. Systolic function was vigorous. The   estimated ejection fraction was in the range of 65% to 70%. - Aortic valve: Calcified with mild to moderate stenosis and   trivial regurgitation. Mean gradient (S): 13 mm Hg. Peak gradient   (S): 23 mm Hg. Valve area (VTI): 1.56 cm^2. Valve area (Vmax):   1.56 cm^2. Valve area (Vmean): 1.6 cm^2. - Aorta: Dilated aortic root. Aortic root dimension: 41 mm (ED). - Mitral valve: Mildly thickened leaflets . There was trivial   regurgitation. - Left atrium: The atrium was mildly dilated. - Tricuspid valve: There was mild regurgitation. - Pulmonary arteries: PA peak pressure: 54 mm Hg (S). - Inferior vena cava: The vessel was dilated. The respirophasic   diameter changes were blunted (< 50%), consistent with elevated   central venous pressure. - Pericardium, extracardiac: A trivial pericardial effusion was   identified posterior to the heart.  Impressions: - Technically difficult study.  LVEF 65-70%, moderate LVH, grossly   normal wall motion, grade 1 DD, mild to moderate aortic stenosis   - trivial AI, estimated AVA around 1.5 cm2, dilated aortic root   to 4.1 cm, mild TR, RVSP 54 mmHg, dilated IVC, trivial posterior   pericardial effusion.  US Guided Biopsy of L inguinal LN 02/28/17  Excisional Biopsy of L inguinal LN 03/04/17  Feeding tube placement under fluoroscopy  Plan is for a Port-A-Cath placement and bone marrow biopsy 2/21   Antibiotics: Patient has completed a course of antibiotics with ceftriaxone and azithromycin  Subjective/Interval History: Patient slept well overnight.  States that his appetite has improved.  Requesting solid foods.  Denies any abdominal pain.  Shortness of breath has improved.  Patient's son is at the bedside.     Objective:  Vital Signs  Vitals:   03/08/17 1601 03/08/17 2145 03/08/17 2152 03/09/17 0512  BP:  121/69  114/63  Pulse:  100  78  Resp:  20  20  Temp:  98.8 F (37.1 C)  98 F (36.7 C)  TempSrc:  Oral  Oral  SpO2: 92% 93% 92% 95%  Weight:      Height:        Intake/Output Summary (Last 24 hours) at 03/09/2017 0820 Last data filed at 03/08/2017 1433 Gross per 24 hour  Intake 822 ml  Output -  Net 822 ml   Filed Weights   03/06/17 0702 03/07/17 0515 03/08/17 1459  Weight: 97.2 kg (214 lb 4.6 oz) 97.8 kg (215 lb 9.8 oz) 96.2 kg (212 lb 1.3 oz)    General appearance: Awake alert.  In no distress. Resp: Tachypnea has improved.  Improved air entry bilaterally.  No wheezing rales or rhonchi. Cardio: S1-S2  is normal regular.  No S3-S4.  No rubs murmurs or bruit. GI: Abdomen is soft.  Nontender nondistended. Extremities: No edema Neurologic: No obvious focal neurological deficits.  Lab Results:  Data Reviewed: I have personally reviewed following labs and imaging studies  CBC: Recent Labs  Lab 03/05/17 0348 03/06/17 0523 03/07/17 0519 03/08/17 0517 03/09/17 0500  WBC 13.3* 13.2* 10.8* 9.6 12.5*    HGB 10.9* 10.8* 11.1* 11.2* 10.1*  HCT 32.6* 32.5* 34.0* 35.0* 31.2*  MCV 98.8 99.4 100.3* 101.4* 101.0*  PLT 175 197 196 184 119    Basic Metabolic Panel: Recent Labs  Lab 03/03/17 0353 03/04/17 0407 03/05/17 0348 03/06/17 0523 03/07/17 0519 03/08/17 0517 03/09/17 0500  NA 134* 136 136 136 136 134* 137  K 4.0 4.0 4.0 4.6 4.3 4.7 4.6  CL 105 107 108 109 109 106 105  CO2 21* 21* 19* 21* 20* 21* 23  GLUCOSE 100* 90 91 134* 124* 129* 92  BUN 26* 32* 35* 37* 37* 33* 40*  CREATININE 1.72* 2.03* 2.02* 1.85* 1.66* 1.52* 1.57*  CALCIUM 8.7* 8.6* 8.3* 8.5* 8.9 9.0 8.4*  MG 2.1 2.1 2.1 2.3  --   --   --     GFR: Estimated Creatinine Clearance: 44.9 mL/min (A) (by C-G formula based on SCr of 1.57 mg/dL (H)).  Liver Function Tests: Recent Labs  Lab 03/04/17 0407 03/05/17 0348 03/06/17 0523 03/09/17 0500  AST 20 20 22 19   ALT 17 17 16* 22  ALKPHOS 48 44 55 54  BILITOT 0.5 0.5 0.6 0.4  PROT 6.5 6.0* 6.9 6.9  ALBUMIN 2.1* 1.9* 1.9* 1.9*    CBG: Recent Labs  Lab 03/08/17 0321 03/08/17 1648 03/08/17 2147 03/09/17 0502 03/09/17 0739  GLUCAP 107* 121* 108* 87 83    Recent Results (from the past 240 hour(s))  Blood culture (routine x 2)     Status: None   Collection Time: 02/27/17  6:00 PM  Result Value Ref Range Status   Specimen Description   Final    BLOOD BLOOD RIGHT HAND Performed at [facility], [address] [place], [place] [address]    Special Requests   Final    BOTTLES DRAWN AEROBIC AND ANAEROBIC Blood Culture adequate volume Performed at [facility], [address] [place], [place] [address]    Culture   Final    NO GROWTH 5 DAYS Performed at [facility], [place] [address], [place], [place] [address]    Report Status 03/04/2017 FINAL  Final  Blood culture (routine x 2)     Status: None   Collection Time: 02/27/17  6:15 PM  Result Value Ref Range Status   Specimen Description   Final    BLOOD LEFT  ANTECUBITAL Performed at [facility], [address] [place], [place] [address]    Special Requests   Final    BOTTLES DRAWN AEROBIC AND ANAEROBIC Blood Culture adequate volume Performed at [facility], [address] [place], [place] [address]    Culture   Final    NO GROWTH 5 DAYS Performed at [facility], [place] [address] [place], [place] [address]    Report Status 03/04/2017 FINAL  Final  Urine culture     Status: Abnormal   Collection Time: 02/27/17  7:40 PM  Result Value Ref Range Status   Specimen Description   Final    URINE, RANDOM Performed at [facility], [address] [place], [place] [address]    Special Requests   Final    NONE Performed at [facility], [address] [place], [place] [address]    Culture >=100,000 COLONIES/mL KLEBSIELLA PNEUMONIAE (A)  Final   Report Status 03/02/2017 FINAL  Final   Organism ID, Bacteria KLEBSIELLA PNEUMONIAE (A)  Final      Susceptibility   Klebsiella pneumoniae - MIC*    AMPICILLIN >=32 RESISTANT Resistant     CEFAZOLIN <=4 SENSITIVE Sensitive     CEFTRIAXONE <=1 SENSITIVE Sensitive     CIPROFLOXACIN <=0.25 SENSITIVE Sensitive     GENTAMICIN <=1 SENSITIVE Sensitive     IMIPENEM <=0.25 SENSITIVE Sensitive     NITROFURANTOIN 64 INTERMEDIATE Intermediate     TRIMETH/SULFA <=20 SENSITIVE Sensitive     AMPICILLIN/SULBACTAM 8 SENSITIVE Sensitive     PIP/TAZO <=4 SENSITIVE Sensitive     Extended ESBL NEGATIVE Sensitive     * >=100,000 COLONIES/mL KLEBSIELLA PNEUMONIAE  Respiratory Panel by PCR     Status: None   Collection Time: 02/28/17  4:14 PM  Result Value Ref Range Status   Adenovirus NOT DETECTED NOT DETECTED Final   Coronavirus 229E NOT DETECTED NOT DETECTED Final   Coronavirus HKU1 NOT DETECTED NOT DETECTED Final   Coronavirus NL63 NOT DETECTED NOT DETECTED Final   Coronavirus OC43 NOT DETECTED NOT DETECTED Final   Metapneumovirus NOT DETECTED NOT DETECTED  Final   Rhinovirus / Enterovirus NOT DETECTED NOT DETECTED Final   Influenza A NOT DETECTED NOT DETECTED Final   Influenza B NOT DETECTED NOT DETECTED Final   Parainfluenza Virus 1 NOT DETECTED NOT DETECTED Final   Parainfluenza Virus 2 NOT DETECTED NOT DETECTED Final   Parainfluenza Virus 3 NOT DETECTED NOT DETECTED Final   Parainfluenza Virus 4 NOT DETECTED NOT DETECTED Final   Respiratory Syncytial Virus NOT DETECTED NOT DETECTED Final   Bordetella pertussis NOT DETECTED NOT DETECTED Final   Chlamydophila pneumoniae NOT DETECTED NOT DETECTED Final   Mycoplasma pneumoniae NOT DETECTED NOT DETECTED Final    Comment: Performed at [facility], [place], [place], [place] [address]  MRSA PCR Screening     Status: None   Collection Time: 03/01/17  9:40 AM  Result Value Ref Range Status   MRSA by PCR NEGATIVE NEGATIVE Final    Comment:        The GeneXpert MRSA Assay (FDA approved for NASAL specimens only), is one component of a comprehensive MRSA colonization surveillance program. It is not intended to diagnose MRSA infection nor to guide or monitor treatment for MRSA infections. Performed at [facility], [place] [address] [place],  [address]       Radiology Studies: Dg Abd Portable 1v  Result Date: 03/08/2017 CLINICAL DATA:  84 year old male with NG tube placement EXAM: PORTABLE ABDOMEN - 1 VIEW COMPARISON:  Fluoroscopy dated 03/06/2017 FINDINGS: An enteric tube is not visualized on the provided image. Right-sided PICC with tip in the region of the cavoatrial junction noted. There are bilateral pleural effusions and bilateral lower lung field atelectasis versus infiltrate. Diffuse interstitial and nodular densities noted in the visualized lungs. The cardiac borders are silhouetted. There is degenerative changes of the spine. IMPRESSION: 1. Nonvisualization of the enteric tube. 2. Right-sided PICC with tip in the region of the cavoatrial junction.  3. Bilateral pleural effusions and bilateral mid to lower lung field atelectasis versus infiltrate. Clinical correlation is recommended. Electronically  Signed   By: Anner Crete M.D.   On: 03/08/2017 00:29   Ir Picc Placement Right >5 Yrs Inc Img Guide  Result Date: 03/07/2017 INDICATION: Lymphoma. Request for durable IV access to initiate chemotherapy. PICC line requested. EXAM: RIGHT UPPER EXTREMITY PICC LINE PLACEMENT WITH ULTRASOUND AND FLUOROSCOPIC GUIDANCE MEDICATIONS: None; ANESTHESIA/SEDATION: Moderate Sedation Time:  None The patient was continuously monitored during the procedure by the interventional radiology nurse under my direct supervision. FLUOROSCOPY TIME:  Fluoroscopy Time: 18 seconds COMPLICATIONS: None immediate. PROCEDURE: The patient was advised of the possible risks and complications and agreed to undergo the procedure. The patient was then brought to the angiographic suite for the procedure. The right arm was prepped with chlorhexidine, draped in the usual sterile fashion using maximum barrier technique (cap and mask, sterile gown, sterile gloves, large sterile sheet, hand hygiene and cutaneous antiseptic). Local anesthesia was attained by infiltration with 1% lidocaine. Ultrasound demonstrated patency of the basilic vein, and this was documented with an image. Under real-time ultrasound guidance, this vein was accessed with a 21 gauge micropuncture needle and image documentation was performed. The needle was exchanged over a guidewire for a peel-away sheath through which a 44 cm 5 [place] dual lumen power injectable PICC was advanced, and positioned with its tip at the lower SVC/right atrial junction. Fluoroscopy during the procedure and fluoro spot radiograph confirms appropriate catheter position. The catheter was flushed, secured to the skin, and covered with a sterile dressing. IMPRESSION: Successful placement of a right arm PICC with sonographic and fluoroscopic guidance. The  catheter is ready for use. Read by: Ascencion Dike PA-C Electronically Signed   By: Marybelle Killings M.D.   On: 03/07/2017 16:55     Medications:  Scheduled: . allopurinol  50 mg Oral Daily  . dronabinol  5 mg Oral BID AC  . feeding supplement (ENSURE ENLIVE)  237 mL Oral BID BM  . feeding supplement (PRO-STAT SUGAR FREE 64)  30 mL Per Tube Daily  . finasteride  5 mg Oral Daily  . fluticasone  1 spray Each Nare Daily  . heparin injection (subcutaneous)  5,000 Units Subcutaneous Q8H  . levalbuterol  0.63 mg Nebulization TID  . LORazepam  1 mg Oral QHS  . metoprolol tartrate  75 mg Oral BID  . multivitamin with minerals  1 tablet Oral Daily  . pantoprazole  40 mg Oral Daily  . predniSONE  60 mg Oral Q breakfast  . sodium chloride flush  3 mL Intravenous Q12H  . tamsulosin  0.4 mg Oral Daily   Continuous: . sodium chloride    . sodium chloride    . feeding supplement (OSMOLITE 1.5 CAL) Stopped (03/08/17 1433)  . ondansetron (ZOFRAN) IVPB     NAT:FTDDUK chloride, acetaminophen, albuterol, alteplase, alum & mag hydroxide-simeth, benzonatate, guaiFENesin, heparin lock flush, heparin lock flush, ibuprofen, LORazepam, ondansetron (ZOFRAN) IVPB, sodium chloride, sodium chloride flush, sodium chloride flush, sodium chloride flush  Assessment/Plan:  Principal Problem:   Angioimmunoblastic lymphoma (HCC) Active Problems:   Hyperlipidemia   Hypertension   CKD (chronic kidney disease) stage 3, GFR 30-59 ml/min (HCC)   Rheumatoid arthritis of multiple sites with negative rheumatoid factor (HCC)   Pulmonary nodules/lesions, multiple   Generalized lymphadenopathy   Unintentional weight loss   Fever   Atherosclerotic peripheral vascular disease (HCC)   Rash    Acute respiratory failure with hypoxia Thought to be due to community-acquired pneumonia.  But patient also noted to have widespread lymphadenopathy and pulmonary nodules.  These could have also contributed.  Patient was started on  antibiotics.  Due to persistent fevers patient was seen by infectious disease.  Antibiotics were discontinued.  Patient was started on steroids after biopsy was performed.  Respiratory status appears to have improved.  Continue supplemental oxygen, nebulizer treatments.  Influenza PCR and respiratory viral panel was negative.   Angioimmunoblastic T-cell lymphoma, Newly diagnosed Imaging study findings were concerning for lymphoma.  Patient underwent lymph node biopsy which showed a lymphoproliferative process but could not offer definitive diagnosis.  Patient underwent excisional lymph node biopsy on 2/18.  Final pathology suggested angioimmunoblastic T-cell lymphoma.  Oncology is following.  Patient was given reduced dose R CHOP on 2/22.  Uric acid level noted to be mildly elevated.  Will be monitored.  Bone marrow biopsy has been postponed per family request.  Echocardiogram done a few days ago showed normal systolic function.  HIV negative.  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   Maculopapular rash Patient was noted to have a maculopapular rash to his bilateral arms and chest and back.  This has improved most likely due to steroids. Etiology unclear.  Poor oral intake with likely moderate to severe protein calorie malnutrition Patient has lost weight.  Patient's son and daughter very concerned that patient has not been eating and drinking much.  Tube feeds were offered to the patient 2/19 but he declined.  This was again discussed with the patient on 2/20.  Feeding tube was inserted he was started on tube feedings.  Overnight on 2/21 the feeding tube got dislodged.  Radiology could not reposition it yesterday.  Plan is for her to be done today.  Patient however reports improving appetite and requesting solid foods which will be initiated.  If patient's appetite continues to improve and if he tolerates solid food then we may be able to discontinue tube  feedings.    Upper extremity swelling/superficial thrombosis No DVT was noted on ultrasound.  Urinary tract infection/hematuria Urine culture positive for Klebsiella. Completed course of antibiotics.  Hypotension/tachycardia  These issues appear to be stable currently.  TSH was 6.45 and will need to be repeated in the outpatient setting.  Normocytic anemia Hemoglobin is stable.  Continue to monitor  Leukocytosis Likely due to steroids.  Continue to monitor.  History of rheumatoid arthritis Holding Xelijanz.  Acute kidney injury on chronic kidney disease stage III Renal function has improved and is stable.  Monitor urine output.  Essential hypertension Continue metoprolol.  Blood pressure is stable.  History of GERD Continue PPI  History of anxiety Continue Ativan  Deviated nasal septum Seen by ENT.  No acute issue for now.  Outpatient follow-up.  Flonase.  Ectatic infrarenal abdominal aorta Outpatient follow-up  DVT Prophylaxis: Subcutaneous heparin    Code Status: Full code Family Communication: Discussed with the patient and his children Disposition Plan: Management as outlined above.  PT and OT to continue to follow.    LOS: 9 days   [facility] Pager [phone] 03/09/2017, 8:20 AM  If 7PM-7AM, please contact night-coverage at [email or web address], password [facility]

## 2017-03-09 NOTE — Progress Notes (Signed)
Jose Meza   DOB:06/07/1931   OZ#:[id number]   NGE#:[id number]  Subjective:  Patient in bed, ng in place, daughter Verdis Frederickson (=HCPOA) in room. Patient denies nausea; is concerned that ng not yet "working;" c/o itchy rash over both forearms; longstanding stool incontinence with perianal rash; very dry moth and altered taste due to mouth breathing. Wonders if he will be able to go home or if he will need a Rehab stay.   Objective: elderly White man examined in bed Vitals:   03/08/17 2152 03/09/17 0512  BP:  114/63  Pulse:  78  Resp:  20  Temp:  98 F (36.7 C)  SpO2: 92% 95%    Body mass index is 25.15 kg/m.  Intake/Output Summary (Last 24 hours) at 03/09/2017 0908 Last data filed at 03/08/2017 1433 Gross per 24 hour  Intake 822 ml  Output -  Net 822 ml     Sclerae unicteric  Oropharynx dry; ng in place  Lungs no rales or wheezes--auscultated anterolaterally  Heart regular rate and rhythm  Abdomen soft, distended, +BS  Neuro nonfocal, well-oriented, positive affect   CBG (last 3)  Recent Labs    03/08/17 2147 03/09/17 0502 03/09/17 0739  GLUCAP 108* 87 83     Labs:  Lab Results  Component Value Date   WBC 12.5 (H) 03/09/2017   HGB 10.1 (L) 03/09/2017   HCT 31.2 (L) 03/09/2017   MCV 101.0 (H) 03/09/2017   PLT 181 03/09/2017   NEUTROABS 7.8 (H) 02/28/2017    @LASTCHEMISTRY @  Urine Studies No results for input(s): UHGB, CRYS in the last 72 hours.  Invalid input(s): UACOL, UAPR, USPG, UPH, UTP, UGL, UKET, UBIL, UNIT, UROB, ULEU, UEPI, UWBCJunie Panning Portal, [place], [place]  Basic Metabolic Panel: Recent Labs  Lab 03/03/17 0353 03/04/17 0407 03/05/17 0348 03/06/17 0523 03/07/17 0519 03/08/17 0517 03/09/17 0500  NA 134* 136 136 136 136 134* 137  K 4.0 4.0 4.0 4.6 4.3 4.7 4.6  CL 105 107 108 109 109 106 105  CO2 21* 21* 19* 21* 20* 21* 23  GLUCOSE 100* 90 91 134* 124* 129* 92  BUN 26* 32* 35* 37* 37* 33* 40*  CREATININE 1.72* 2.03* 2.02* 1.85* 1.66* 1.52*  1.57*  CALCIUM 8.7* 8.6* 8.3* 8.5* 8.9 9.0 8.4*  MG 2.1 2.1 2.1 2.3  --   --   --    GFR Estimated Creatinine Clearance: 44.9 mL/min (A) (by C-G formula based on SCr of 1.57 mg/dL (H)). Liver Function Tests: Recent Labs  Lab 03/04/17 0407 03/05/17 0348 03/06/17 0523 03/09/17 0500  AST 20 20 22 19   ALT 17 17 16* 22  ALKPHOS 48 44 55 54  BILITOT 0.5 0.5 0.6 0.4  PROT 6.5 6.0* 6.9 6.9  ALBUMIN 2.1* 1.9* 1.9* 1.9*   No results for input(s): LIPASE, AMYLASE in the last 168 hours. No results for input(s): AMMONIA in the last 168 hours. Coagulation profile No results for input(s): INR, PROTIME in the last 168 hours.  CBC: Recent Labs  Lab 03/05/17 0348 03/06/17 0523 03/07/17 0519 03/08/17 0517 03/09/17 0500  WBC 13.3* 13.2* 10.8* 9.6 12.5*  HGB 10.9* 10.8* 11.1* 11.2* 10.1*  HCT 32.6* 32.5* 34.0* 35.0* 31.2*  MCV 98.8 99.4 100.3* 101.4* 101.0*  PLT 175 197 196 184 181   Cardiac Enzymes: No results for input(s): CKTOTAL, CKMB, CKMBINDEX, TROPONINI in the last 168 hours. BNP: Invalid input(s): POCBNP CBG: Recent Labs  Lab 03/08/17 0321 03/08/17 1648 03/08/17 2147 03/09/17 0502 03/09/17 4010  GLUCAP 107* 121* 108* 87 83   D-Dimer No results for input(s): DDIMER in the last 72 hours. Hgb A1c No results for input(s): HGBA1C in the last 72 hours. Lipid Profile No results for input(s): CHOL, HDL, LDLCALC, TRIG, CHOLHDL, LDLDIRECT in the last 72 hours. Thyroid function studies No results for input(s): TSH, T4TOTAL, T3FREE, THYROIDAB in the last 72 hours.  Invalid input(s): FREET3 Anemia work up No results for input(s): VITAMINB12, FOLATE, FERRITIN, TIBC, IRON, RETICCTPCT in the last 72 hours. Microbiology Recent Results (from the past 240 hour(s))  Blood culture (routine x 2)     Status: None   Collection Time: 02/27/17  6:00 PM  Result Value Ref Range Status   Specimen Description   Final    BLOOD BLOOD RIGHT HAND Performed at [facility], [address] [place], [place] [address]    Special Requests   Final    BOTTLES DRAWN AEROBIC AND ANAEROBIC Blood Culture adequate volume Performed at [facility], [address] [place], [place] [address]    Culture   Final    NO GROWTH 5 DAYS Performed at [facility], [place] [address] [place], [place] [address]    Report Status 03/04/2017 FINAL  Final  Blood culture (routine x 2)     Status: None   Collection Time: 02/27/17  6:15 PM  Result Value Ref Range Status   Specimen Description   Final    BLOOD LEFT ANTECUBITAL Performed at [facility], [address] [place], [place] [address]    Special Requests   Final    BOTTLES DRAWN AEROBIC AND ANAEROBIC Blood Culture adequate volume Performed at [facility], [address] [place], [place] [address]    Culture   Final    NO GROWTH 5 DAYS Performed at [facility], [place] [address] [place], [place] [address]    Report Status 03/04/2017 FINAL  Final  Urine culture     Status: Abnormal   Collection Time: 02/27/17  7:40 PM  Result Value Ref Range Status   Specimen Description   Final    URINE, RANDOM Performed at [facility], [address] [place], [place] [address]    Special Requests   Final    NONE Performed at [facility], [address] [place], [place] [address]    Culture >=100,000 COLONIES/mL KLEBSIELLA PNEUMONIAE (A)  Final   Report Status 03/02/2017 FINAL  Final   Organism ID, Bacteria KLEBSIELLA PNEUMONIAE (A)  Final      Susceptibility   Klebsiella pneumoniae - MIC*    AMPICILLIN >=32 RESISTANT Resistant     CEFAZOLIN <=4 SENSITIVE Sensitive     CEFTRIAXONE <=1 SENSITIVE Sensitive     CIPROFLOXACIN <=0.25 SENSITIVE Sensitive     GENTAMICIN <=1 SENSITIVE Sensitive     IMIPENEM <=0.25 SENSITIVE Sensitive     NITROFURANTOIN 64 INTERMEDIATE Intermediate     TRIMETH/SULFA <=20 SENSITIVE Sensitive     AMPICILLIN/SULBACTAM 8 SENSITIVE  Sensitive     PIP/TAZO <=4 SENSITIVE Sensitive     Extended ESBL NEGATIVE Sensitive     * >=100,000 COLONIES/mL KLEBSIELLA PNEUMONIAE  Respiratory Panel by PCR     Status: None   Collection Time: 02/28/17  4:14 PM  Result Value Ref Range Status   Adenovirus NOT DETECTED NOT DETECTED Final   Coronavirus 229E NOT DETECTED NOT DETECTED Final   Coronavirus  HKU1 NOT DETECTED NOT DETECTED Final   Coronavirus NL63 NOT DETECTED NOT DETECTED Final   Coronavirus OC43 NOT DETECTED NOT DETECTED Final   Metapneumovirus NOT DETECTED NOT DETECTED Final   Rhinovirus / Enterovirus NOT DETECTED NOT DETECTED Final   Influenza A NOT DETECTED NOT DETECTED Final   Influenza B NOT DETECTED NOT DETECTED Final   Parainfluenza Virus 1 NOT DETECTED NOT DETECTED Final   Parainfluenza Virus 2 NOT DETECTED NOT DETECTED Final   Parainfluenza Virus 3 NOT DETECTED NOT DETECTED Final   Parainfluenza Virus 4 NOT DETECTED NOT DETECTED Final   Respiratory Syncytial Virus NOT DETECTED NOT DETECTED Final   Bordetella pertussis NOT DETECTED NOT DETECTED Final   Chlamydophila pneumoniae NOT DETECTED NOT DETECTED Final   Mycoplasma pneumoniae NOT DETECTED NOT DETECTED Final    Comment: Performed at [facility], [place] [address], [place], [place] [address]  MRSA PCR Screening     Status: None   Collection Time: 03/01/17  9:40 AM  Result Value Ref Range Status   MRSA by PCR NEGATIVE NEGATIVE Final    Comment:        The GeneXpert MRSA Assay (FDA approved for NASAL specimens only), is one component of a comprehensive MRSA colonization surveillance program. It is not intended to diagnose MRSA infection nor to guide or monitor treatment for MRSA infections. Performed at [facility], [place] [address] [place], [place] [address]       Studies:  Dg Abd Portable 1v  Result Date: 03/08/2017 CLINICAL DATA:  84 year old male with NG tube placement EXAM: PORTABLE ABDOMEN - 1 VIEW COMPARISON:   Fluoroscopy dated 03/06/2017 FINDINGS: An enteric tube is not visualized on the provided image. Right-sided PICC with tip in the region of the cavoatrial junction noted. There are bilateral pleural effusions and bilateral lower lung field atelectasis versus infiltrate. Diffuse interstitial and nodular densities noted in the visualized lungs. The cardiac borders are silhouetted. There is degenerative changes of the spine. IMPRESSION: 1. Nonvisualization of the enteric tube. 2. Right-sided PICC with tip in the region of the cavoatrial junction. 3. Bilateral pleural effusions and bilateral mid to lower lung field atelectasis versus infiltrate. Clinical correlation is recommended. Electronically Signed   By: Anner Crete M.D.   On: 03/08/2017 00:29   Ir Picc Placement Right >5 Yrs Inc Img Guide  Result Date: 03/07/2017 INDICATION: Lymphoma. Request for durable IV access to initiate chemotherapy. PICC line requested. EXAM: RIGHT UPPER EXTREMITY PICC LINE PLACEMENT WITH ULTRASOUND AND FLUOROSCOPIC GUIDANCE MEDICATIONS: None; ANESTHESIA/SEDATION: Moderate Sedation Time:  None The patient was continuously monitored during the procedure by the interventional radiology nurse under my direct supervision. FLUOROSCOPY TIME:  Fluoroscopy Time: 18 seconds COMPLICATIONS: None immediate. PROCEDURE: The patient was advised of the possible risks and complications and agreed to undergo the procedure. The patient was then brought to the angiographic suite for the procedure. The right arm was prepped with chlorhexidine, draped in the usual sterile fashion using maximum barrier technique (cap and mask, sterile gown, sterile gloves, large sterile sheet, hand hygiene and cutaneous antiseptic). Local anesthesia was attained by infiltration with 1% lidocaine. Ultrasound demonstrated patency of the basilic vein, and this was documented with an image. Under real-time ultrasound guidance, this vein was accessed with a 21 gauge  micropuncture needle and image documentation was performed. The needle was exchanged over a guidewire for a peel-away sheath through which a 44 cm 5 [place] dual lumen power injectable PICC was advanced, and positioned with  its tip at the lower SVC/right atrial junction. Fluoroscopy during the procedure and fluoro spot radiograph confirms appropriate catheter position. The catheter was flushed, secured to the skin, and covered with a sterile dressing. IMPRESSION: Successful placement of a right arm PICC with sonographic and fluoroscopic guidance. The catheter is ready for use. Read by: Ascencion Dike PA-C Electronically Signed   By: Marybelle Killings M.D.   On: 03/07/2017 16:55    Assessment/Plan: 84 y.o.  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 03/04/2017, admitted for chemotherapy and monitoring for possible tumor lysis  (1) received dose-reduced [facility] chemotherapy 03/08/2017  (a) continues on prednisone daily  (b) daily labs to monitor for tumor lysis (unlikely)  (2) severe septal deviation/ nasal obstruction, resulting in mouth-breathing, dry mouth, altered taste, food aversion, significant weight loss  (a) on marinol  (b) ng placed 2/20--placement confirmation pending  (c) may benefit from saline mouth rinses  (d) consider low-dose remeron hs  (3) longstanding stool incontinence due to remote spinal surgery  (a) add precautions  (b) desitin to perianal area BID  (4) rash over arms  ((a) added benadryl cream PRN  (5) headache-- likely secondary to sinus  (a) added loratadine .   Chauncey Cruel, MD 03/09/2017  9:08 AM Medical Oncology and Hematology [facility] [address] [place], [place] [address] Tel. [phone]    Fax. [phone]

## 2017-03-10 ENCOUNTER — Inpatient Hospital Stay (HOSPITAL_COMMUNITY): Payer: Medicare HMO

## 2017-03-10 DIAGNOSIS — R600 Localized edema: Secondary | ICD-10-CM

## 2017-03-10 DIAGNOSIS — M7989 Other specified soft tissue disorders: Secondary | ICD-10-CM

## 2017-03-10 LAB — COMPREHENSIVE METABOLIC PANEL
ALK PHOS: 54 U/L (ref 38–126)
ALT: 21 U/L (ref 17–63)
ANION GAP: 8 (ref 5–15)
AST: 22 U/L (ref 15–41)
Albumin: 2.1 g/dL — ABNORMAL LOW (ref 3.5–5.0)
BILIRUBIN TOTAL: 0.5 mg/dL (ref 0.3–1.2)
BUN: 42 mg/dL — ABNORMAL HIGH (ref 6–20)
CALCIUM: 8.4 mg/dL — AB (ref 8.9–10.3)
CO2: 24 mmol/L (ref 22–32)
Chloride: 103 mmol/L (ref 101–111)
Creatinine, Ser: 1.55 mg/dL — ABNORMAL HIGH (ref 0.61–1.24)
GFR, EST AFRICAN AMERICAN: 46 mL/min — AB (ref >60)
GFR, EST NON AFRICAN AMERICAN: 40 mL/min — AB (ref >60)
Glucose, Bld: 98 mg/dL (ref 65–99)
Potassium: 4.1 mmol/L (ref 3.5–5.1)
Sodium: 135 mmol/L (ref 135–145)
TOTAL PROTEIN: 7.2 g/dL (ref 6.5–8.1)

## 2017-03-10 LAB — GLUCOSE, CAPILLARY
GLUCOSE-CAPILLARY: 122 mg/dL — AB (ref 65–99)
GLUCOSE-CAPILLARY: 153 mg/dL — AB (ref 65–99)
GLUCOSE-CAPILLARY: 155 mg/dL — AB (ref 65–99)
Glucose-Capillary: 110 mg/dL — ABNORMAL HIGH (ref 65–99)
Glucose-Capillary: 102 mg/dL — ABNORMAL HIGH (ref 65–99)
Glucose-Capillary: 112 mg/dL — ABNORMAL HIGH (ref 65–99)

## 2017-03-10 LAB — CBC
HEMATOCRIT: 29.7 % — AB (ref 39.0–52.0)
HEMOGLOBIN: 9.7 g/dL — AB (ref 13.0–17.0)
MCH: 32.9 pg (ref 26.0–34.0)
MCHC: 32.7 g/dL (ref 30.0–36.0)
MCV: 100.7 fL — AB (ref 78.0–100.0)
Platelets: 173 10*3/uL (ref 150–400)
RBC: 2.95 MIL/uL — ABNORMAL LOW (ref 4.22–5.81)
RDW: 16.5 % — ABNORMAL HIGH (ref 11.5–15.5)
WBC: 8.1 10*3/uL (ref 4.0–10.5)

## 2017-03-10 LAB — LACTATE DEHYDROGENASE: LDH: 166 U/L (ref 98–192)

## 2017-03-10 LAB — URIC ACID: URIC ACID, SERUM: 8.7 mg/dL — AB (ref 4.4–7.6)

## 2017-03-10 LAB — MAGNESIUM: MAGNESIUM: 2.3 mg/dL (ref 1.7–2.4)

## 2017-03-10 MED ORDER — LOPERAMIDE HCL 2 MG PO CAPS
4.0000 mg | ORAL_CAPSULE | Freq: Once | ORAL | Status: AC
  Administered 2017-03-10: 4 mg via ORAL
  Filled 2017-03-10: qty 2

## 2017-03-10 MED ORDER — LOPERAMIDE HCL 2 MG PO CAPS
2.0000 mg | ORAL_CAPSULE | Freq: Three times a day (TID) | ORAL | Status: DC | PRN
  Administered 2017-03-10 – 2017-03-11 (×2): 2 mg via ORAL
  Filled 2017-03-10 (×2): qty 1

## 2017-03-10 MED ORDER — ALLOPURINOL 100 MG PO TABS
100.0000 mg | ORAL_TABLET | Freq: Every day | ORAL | Status: DC
  Administered 2017-03-11 – 2017-03-14 (×3): 100 mg via ORAL
  Filled 2017-03-10 (×4): qty 1

## 2017-03-10 NOTE — Progress Notes (Signed)
Flex a seal was inserted by Beverlee Nims T., within a few minutes it came out because of poor rectal tone. Pt refuses to use a condom catheter, they requested a foley catheter, Dr. [place] Pink didn't think it was a good idea. Buttocks and inguinal creases reddened from urine and fecal incontinence. Barrier cream applied after each incontinence. Family at bedside. Pt sat at the bedside and tolerated well, able to lift both legs back into bed without any help. Both legs elevated on 2 pillows.

## 2017-03-10 NOTE — Progress Notes (Signed)
Bilateral lower extremity venous duplex completed. No evience of a DVT, superficial thrombosis, or Baker's cyst. Toma Copier, RVS 03/10/2017, 1:03 PM

## 2017-03-10 NOTE — Care Management Important Message (Signed)
Important Message  Patient Details  Name: Jose Meza MRN: [id number] Date of Birth: 12-Jun-1931   Medicare Important Message Given:  Yes    Apolonio Schneiders, RN 03/10/2017, 11:12 AM

## 2017-03-10 NOTE — Progress Notes (Signed)
 [facility] PROGRESS NOTE  Kwali R Whistler MRN:[id number] DOB: 07/20/1931 DOA: 02/27/2017  PCP: Stoneking, Hal, MD  Brief History/Interval Summary: 84-year-old Caucasian male with a past medical history of rheumatoid arthritis chronically on steroids, hypertension was hospitalized due to extensive lymphadenopathy detected on imaging studies.  Patient's symptoms included chills fever weight loss shortness of breath.  Concern was for lymphoma.  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  Patient was given chemotherapeutic agents on 2/22.  He is currently requiring tube feedings through Dobbhoff tube.  Reason for Visit: angioimmunoblastic T-cell lymphoma.  Consultants: Oncology.  General surgery.  Procedures:  Transthoracic echocardiogram Study Conclusions  - Procedure narrative: Transthoracic echocardiography. Image   quality was suboptimal. The study was technically difficult, as a   result of body habitus. - Left ventricle: The cavity size was normal. There was moderate   concentric hypertrophy. Systolic function was vigorous. The   estimated ejection fraction was in the range of 65% to 70%. - Aortic valve: Calcified with mild to moderate stenosis and   trivial regurgitation. Mean gradient (S): 13 mm Hg. Peak gradient   (S): 23 mm Hg. Valve area (VTI): 1.56 cm^2. Valve area (Vmax):   1.56 cm^2. Valve area (Vmean): 1.6 cm^2. - Aorta: Dilated aortic root. Aortic root dimension: 41 mm (ED). - Mitral valve: Mildly thickened leaflets . There was trivial   regurgitation. - Left atrium: The atrium was mildly dilated. - Tricuspid valve: There was mild regurgitation. - Pulmonary arteries: PA peak pressure: 54 mm Hg (S). - Inferior vena cava: The vessel was dilated. The respirophasic   diameter changes were blunted (< 50%), consistent with elevated   central venous pressure. - Pericardium, extracardiac: A trivial pericardial effusion was   identified posterior  to the heart.  Impressions: - Technically difficult study. LVEF 65-70%, moderate LVH, grossly   normal wall motion, grade 1 DD, mild to moderate aortic stenosis   - trivial AI, estimated AVA around 1.5 cm2, dilated aortic root   to 4.1 cm, mild TR, RVSP 54 mmHg, dilated IVC, trivial posterior   pericardial effusion.  US Guided Biopsy of L inguinal LN 02/28/17  Excisional Biopsy of L inguinal LN 03/04/17  Feeding tube placement under fluoroscopy  PICC line placed 2/21   Antibiotics: Patient has completed a course of antibiotics with ceftriaxone and azithromycin  Subjective/Interval History: Patient noted to be in better spirits.  Concerned about his loose stools.  Denies any nausea vomiting.  Denies any abdominal pain.  Appetite still remains poor.  Only eating small bites of his meals.  Today he is mainly concerned about lower extremity swelling, left more than right.   Objective:  Vital Signs  Vitals:   03/09/17 1416 03/09/17 2005 03/09/17 2115 03/10/17 0659  BP: 110/60 126/69  (!) 141/72  Pulse: 87 99  96  Resp: 18 20  20  Temp: 98.5 F (36.9 C) 98 F (36.7 C)  97.8 F (36.6 C)  TempSrc: Oral Oral  Oral  SpO2: 97% 90% 90% 93%  Weight:      Height:        Intake/Output Summary (Last 24 hours) at 03/10/2017 0923 Last data filed at 03/10/2017 0855 Gross per 24 hour  Intake 1899.5 ml  Output 1100 ml  Net 799.5 ml   Filed Weights   03/06/17 0702 03/07/17 0515 03/08/17 1459  Weight: 97.2 kg (214 lb 4.6 oz) 97.8 kg (215 lb 9.8 oz) 96.2 kg (212 lb 1.3 oz)    General appearance:   Awake alert.  In no distress. Resp: Normal effort at rest.  Good air entry bilaterally.  No wheezing rales or rhonchi.   Cardio: S1-S2 is normal regular.  No S3-S4. no rubs murmurs of bruit GI: Abdomen remains soft.  Nontender nondistended Extremities: Pitting edema noted bilateral lower extremities but left more than right. Neurologic: No obvious focal neurological deficits.  Lab  Results:  Data Reviewed: I have personally reviewed following labs and imaging studies  CBC: Recent Labs  Lab 03/06/17 0523 03/07/17 0519 03/08/17 0517 03/09/17 0500 03/10/17 0500  WBC 13.2* 10.8* 9.6 12.5* 8.1  HGB 10.8* 11.1* 11.2* 10.1* 9.7*  HCT 32.5* 34.0* 35.0* 31.2* 29.7*  MCV 99.4 100.3* 101.4* 101.0* 100.7*  PLT 197 196 184 181 956    Basic Metabolic Panel: Recent Labs  Lab 03/04/17 0407 03/05/17 0348 03/06/17 0523 03/07/17 0519 03/08/17 0517 03/09/17 0500 03/10/17 0500  NA 136 136 136 136 134* 137 135  K 4.0 4.0 4.6 4.3 4.7 4.6 4.1  CL 107 108 109 109 106 105 103  CO2 21* 19* 21* 20* 21* 23 24  GLUCOSE 90 91 134* 124* 129* 92 98  BUN 32* 35* 37* 37* 33* 40* 42*  CREATININE 2.03* 2.02* 1.85* 1.66* 1.52* 1.57* 1.55*  CALCIUM 8.6* 8.3* 8.5* 8.9 9.0 8.4* 8.4*  MG 2.1 2.1 2.3  --   --   --  2.3    GFR: Estimated Creatinine Clearance: 45.5 mL/min (A) (by C-G formula based on SCr of 1.55 mg/dL (H)).  Liver Function Tests: Recent Labs  Lab 03/04/17 0407 03/05/17 0348 03/06/17 0523 03/09/17 0500 03/10/17 0500  AST _0 ALT 17 17 16* 22 21  ALKPHOS 48 44 55 54 54  BILITOT 0.5 0.5 0.6 0.4 0.5  PROT 6.5 6.0* 6.9 6.9 7.2  ALBUMIN 2.1* 1.9* 1.9* 1.9* 2.1*    CBG: Recent Labs  Lab 03/09/17 1611 03/09/17 1959 03/10/17 0141 03/10/17 0655 03/10/17 0742  GLUCAP 117* 107* 110* 102* 112*    Recent Results (from the past 240 hour(s))  Respiratory Panel by PCR     Status: None   Collection Time: 02/28/17  4:14 PM  Result Value Ref Range Status   Adenovirus NOT DETECTED NOT DETECTED Final   Coronavirus 229E NOT DETECTED NOT DETECTED Final   Coronavirus HKU1 NOT DETECTED NOT DETECTED Final   Coronavirus NL63 NOT DETECTED NOT DETECTED Final   Coronavirus OC43 NOT DETECTED NOT DETECTED Final   Metapneumovirus NOT DETECTED NOT DETECTED Final   Rhinovirus / Enterovirus NOT DETECTED NOT DETECTED Final   Influenza A NOT DETECTED NOT DETECTED  Final   Influenza B NOT DETECTED NOT DETECTED Final   Parainfluenza Virus 1 NOT DETECTED NOT DETECTED Final   Parainfluenza Virus 2 NOT DETECTED NOT DETECTED Final   Parainfluenza Virus 3 NOT DETECTED NOT DETECTED Final   Parainfluenza Virus 4 NOT DETECTED NOT DETECTED Final   Respiratory Syncytial Virus NOT DETECTED NOT DETECTED Final   Bordetella pertussis NOT DETECTED NOT DETECTED Final   Chlamydophila pneumoniae NOT DETECTED NOT DETECTED Final   Mycoplasma pneumoniae NOT DETECTED NOT DETECTED Final    Comment: Performed at [facility], [place] [address] [place], [place] [address]  MRSA PCR Screening     Status: None   Collection Time: 03/01/17  9:40 AM  Result Value Ref Range Status   MRSA by PCR NEGATIVE NEGATIVE Final    Comment:        The GeneXpert  MRSA Assay (FDA approved for NASAL specimens only), is one component of a comprehensive MRSA colonization surveillance program. It is not intended to diagnose MRSA infection nor to guide or monitor treatment for MRSA infections. Performed at [facility], [address] [place], [place] [address]       Radiology Studies: Dg Naso G Tube Plc W/fl W/rad  Result Date: 03/09/2017 CLINICAL DATA:  Malpositioned nasogastric tube. EXAM: NASO G TUBE PLACEMENT WITH FL AND WITH RAD CONTRAST:  30 cc Isovue-300 - administered into the gastric lumen FLUOROSCOPY TIME:  2 minutes, 36 seconds (13.1 mGy) COMPARISON:  None. FINDINGS: The patient did not wish to have the existing nasogastric tube removed and requested the tube simply be advanced. As such, existing retracted nasogastric tube was advanced utilizing intermittent fluoroscopic guidance with tip ultimately terminating within the gastric antrum. Contrast injection confirmed appropriate positioning and functionality. IMPRESSION: Fluoroscopic guided advancement of existing nasogastric tube with tip terminating within the gastric antrum. The nasogastric tube is ready for  immediate use. Electronically Signed   By: John  Watts M.D.   On: 03/09/2017 11:43     Medications:  Scheduled: . allopurinol  50 mg Oral Daily  . dronabinol  5 mg Oral BID AC  . feeding supplement (ENSURE ENLIVE)  237 mL Oral BID BM  . feeding supplement (PRO-STAT SUGAR FREE 64)  30 mL Per Tube Daily  . finasteride  5 mg Oral Daily  . fluticasone  1 spray Each Nare Daily  . heparin injection (subcutaneous)  5,000 Units Subcutaneous Q8H  . levalbuterol  0.63 mg Nebulization BID  . liver oil-zinc oxide   Topical BID  . LORazepam  1 mg Oral QHS  . metoprolol tartrate  75 mg Oral BID  . multivitamin with minerals  1 tablet Oral Daily  . pantoprazole  40 mg Oral Daily  . predniSONE  60 mg Oral Q breakfast  . sodium chloride flush  3 mL Intravenous Q12H  . tamsulosin  0.4 mg Oral Daily   Continuous: . sodium chloride    . feeding supplement (OSMOLITE 1.5 CAL) 1,000 mL (03/09/17 2225)  . ondansetron (ZOFRAN) IVPB     PRN:sodium chloride, acetaminophen, albuterol, alteplase, alum & mag hydroxide-simeth, benzonatate, diphenhydrAMINE-zinc acetate, guaiFENesin, heparin lock flush, heparin lock flush, ibuprofen, iopamidol, LORazepam, ondansetron (ZOFRAN) IVPB, sodium chloride, sodium chloride flush, sodium chloride flush, sodium chloride flush  Assessment/Plan:  Principal Problem:   Angioimmunoblastic lymphoma (HCC) Active Problems:   Hyperlipidemia   Hypertension   CKD (chronic kidney disease) stage 3, GFR 30-59 ml/min (HCC)   Rheumatoid arthritis of multiple sites with negative rheumatoid factor (HCC)   Pulmonary nodules/lesions, multiple   Generalized lymphadenopathy   Unintentional weight loss   Fever   Atherosclerotic peripheral vascular disease (HCC)   Rash    Acute respiratory failure with hypoxia Initially thought to be due to community-acquired pneumonia.  But patient also noted to have widespread lymphadenopathy and pulmonary nodules.  These could have also  contributed.  Patient was started on antibiotics.  Due to persistent fevers patient was seen by infectious disease.  Influenza PCR and respiratory viral panel was negative.  Antibiotics were discontinued.  Patient was started on steroids after biopsy was performed.  Respiratory status continues to improve.  Continue supplemental oxygen, nebulizer treatments.  Wean down oxygen as tolerated.   Angioimmunoblastic T-cell lymphoma, Newly diagnosed Imaging study findings were concerning for lymphoma.  Patient underwent lymph node biopsy which showed a lymphoproliferative process but could not   offer definitive diagnosis. HIV negative.  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  Patient underwent excisional lymph node biopsy on 2/18.  Final pathology suggested angioimmunoblastic T-cell lymphoma.  Patient seen by oncology.  Patient was given reduced dose R CHOP on 2/22.  Monitor for tumor lysis syndrome.  Uric acid level noted to be elevated.  Patient already on allopurinol.  Dose increased by oncology today.  Bone marrow biopsy was postponed per family request.  Echocardiogram showed normal systolic function.    Maculopapular rash Patient was noted to have a maculopapular rash to his bilateral arms and chest and back.  This has improved most likely due to steroids. Etiology unclear.  Poor oral intake with likely moderate to severe protein calorie malnutrition Patient has lost weight.  Patient's son and daughter very concerned that patient has not been eating and drinking much.  Tube feeds were offered to the patient 2/19 but he declined.  This was again discussed with the patient on 2/20.  Feeding tube was inserted he was started on tube feedings.  Overnight on 2/21 the feeding tube got dislodged.  This was repositioned on 2/23.  Tube feedings were reinitiated.  He is also on regular diet although his appetite remains poor.  Once his oral intake is more consistent we  could do a calorie count.  Dietitian is following.    Lower extremity edema Probably due to hypoalbuminemia from poor nutrition.  However we will proceed with venous Doppler to rule out DVT.  Upper extremity swelling/superficial thrombosis No DVT was noted on upper extremity ultrasound.  Urinary tract infection/hematuria Urine culture positive for Klebsiella. Completed course of antibiotics.  Hypotension/tachycardia  These issues appear to be stable currently.  TSH was 6.45 and will need to be repeated in the outpatient setting.  Normocytic anemia Hemoglobin is stable.  Continue to monitor  Leukocytosis Likely due to steroids.  Continue to monitor.  History of rheumatoid arthritis Holding Xelijanz.  Acute kidney injury on chronic kidney disease stage III Renal function has improved and is stable.  Monitor urine output.  Essential hypertension Continue metoprolol.  Blood pressure is stable.  History of GERD Continue PPI  History of anxiety Continue Ativan  Deviated nasal septum Seen by ENT.  No acute issue for now.  Outpatient follow-up.  Flonase.  Ectatic infrarenal abdominal aorta Outpatient follow-up  DVT Prophylaxis: Subcutaneous heparin    Code Status: Full code Family Communication: Discussed with the patient and his children Disposition Plan: Management as outlined above.  PT and OT to continue to follow.    LOS: 10 days   [facility] Pager [phone] 03/10/2017, 9:23 AM  If 7PM-7AM, please contact night-coverage at [email or web address], password [facility]

## 2017-03-10 NOTE — Care Management (Signed)
CM spoke with the patient and family members at the bedside. IM explained and provided. Family asked how they can contact CM. Provided family with the telephone number to contact CM today and informed the family they can also inform the patient's nurse who will contact CM if they need assistance from CM. Provided an explanation of needs CM can assist the patient/family with. Family is aware there will be a different CM available tomorrow. Venita Sheffield RN CCM

## 2017-03-10 NOTE — Progress Notes (Signed)
Jose Meza   DOB:17-Aug-1931   XB#:[id number]   ZHY#:[id number]  Subjective:  Mr Jose Meza is feeling better today. NG was repositioned and he is getting feedings. He denies ST or discomfort from the ng. He continues incontinent of stool and urine. His bottom is better w the desitin but it hurts every time he is cleaned, which is often. He remains bed bound. Denies h/a, N/V, cough/ pleurisy or worsening SOB. Both daughters and a grandson in room   Objective: elderly White man examined in bed Vitals:   03/10/17 0659 03/10/17 0955  BP: (!) 141/72 (!) 141/72  Pulse: 96 96  Resp: 20   Temp: 97.8 F (36.6 C)   SpO2: 93%     Body mass index is 25.15 kg/m.  Intake/Output Summary (Last 24 hours) at 03/10/2017 1132 Last data filed at 03/10/2017 1013 Gross per 24 hour  Intake 2139.5 ml  Output 1100 ml  Net 1039.5 ml   Sclerae unicteric, EOMs intact Ng in place Lungs no rales or rhonchi, auscultated anterolaterally Heart regular rate and rhythm Abd soft, nontender, positive bowel sounds Grade 1 bilateral ankle edema Neuro: nonfocal, well oriented,positive affect   CBG (last 3)  Recent Labs    03/10/17 0141 03/10/17 0655 03/10/17 0742  GLUCAP 110* 102* 112*     Labs:  Lab Results  Component Value Date   WBC 8.1 03/10/2017   HGB 9.7 (L) 03/10/2017   HCT 29.7 (L) 03/10/2017   MCV 100.7 (H) 03/10/2017   PLT 173 03/10/2017   NEUTROABS 7.8 (H) 02/28/2017    @LASTCHEMISTRY @  Urine Studies No results for input(s): UHGB, CRYS in the last 72 hours.  Invalid input(s): UACOL, UAPR, USPG, UPH, UTP, UGL, UKET, UBIL, UNIT, UROB, ULEU, UEPI, UWBCJunie Panning [place], [place], [place]  Basic Metabolic Panel: Recent Labs  Lab 03/04/17 0407 03/05/17 0348 03/06/17 0523 03/07/17 0519 03/08/17 0517 03/09/17 0500 03/10/17 0500  NA 136 136 136 136 134* 137 135  K 4.0 4.0 4.6 4.3 4.7 4.6 4.1  CL 107 108 109 109 106 105 103  CO2 21* 19* 21* 20* 21* 23 24  GLUCOSE 90 91 134* 124* 129* 92  98  BUN 32* 35* 37* 37* 33* 40* 42*  CREATININE 2.03* 2.02* 1.85* 1.66* 1.52* 1.57* 1.55*  CALCIUM 8.6* 8.3* 8.5* 8.9 9.0 8.4* 8.4*  MG 2.1 2.1 2.3  --   --   --  2.3   GFR Estimated Creatinine Clearance: 45.5 mL/min (A) (by C-G formula based on SCr of 1.55 mg/dL (H)). Liver Function Tests: Recent Labs  Lab 03/04/17 0407 03/05/17 0348 03/06/17 0523 03/09/17 0500 03/10/17 0500  AST 20 20 22 19 22   ALT 17 17 16* 22 21  ALKPHOS 48 44 55 54 54  BILITOT 0.5 0.5 0.6 0.4 0.5  PROT 6.5 6.0* 6.9 6.9 7.2  ALBUMIN 2.1* 1.9* 1.9* 1.9* 2.1*   No results for input(s): LIPASE, AMYLASE in the last 168 hours. No results for input(s): AMMONIA in the last 168 hours. Coagulation profile No results for input(s): INR, PROTIME in the last 168 hours.  CBC: Recent Labs  Lab 03/06/17 0523 03/07/17 0519 03/08/17 0517 03/09/17 0500 03/10/17 0500  WBC 13.2* 10.8* 9.6 12.5* 8.1  HGB 10.8* 11.1* 11.2* 10.1* 9.7*  HCT 32.5* 34.0* 35.0* 31.2* 29.7*  MCV 99.4 100.3* 101.4* 101.0* 100.7*  PLT 197 196 184 181 173   Cardiac Enzymes: No results for input(s): CKTOTAL, CKMB, CKMBINDEX, TROPONINI in the last 168 hours. BNP: Invalid  input(s): POCBNP CBG: Recent Labs  Lab 03/09/17 1611 03/09/17 1959 03/10/17 0141 03/10/17 0655 03/10/17 0742  GLUCAP 117* 107* 110* 102* 112*   D-Dimer No results for input(s): DDIMER in the last 72 hours. Hgb A1c No results for input(s): HGBA1C in the last 72 hours. Lipid Profile No results for input(s): CHOL, HDL, LDLCALC, TRIG, CHOLHDL, LDLDIRECT in the last 72 hours. Thyroid function studies No results for input(s): TSH, T4TOTAL, T3FREE, THYROIDAB in the last 72 hours.  Invalid input(s): FREET3 Anemia work up No results for input(s): VITAMINB12, FOLATE, FERRITIN, TIBC, IRON, RETICCTPCT in the last 72 hours. Microbiology Recent Results (from the past 240 hour(s))  Respiratory Panel by PCR     Status: None   Collection Time: 02/28/17  4:14 PM  Result Value  Ref Range Status   Adenovirus NOT DETECTED NOT DETECTED Final   Coronavirus 229E NOT DETECTED NOT DETECTED Final   Coronavirus HKU1 NOT DETECTED NOT DETECTED Final   Coronavirus NL63 NOT DETECTED NOT DETECTED Final   Coronavirus OC43 NOT DETECTED NOT DETECTED Final   Metapneumovirus NOT DETECTED NOT DETECTED Final   Rhinovirus / Enterovirus NOT DETECTED NOT DETECTED Final   Influenza A NOT DETECTED NOT DETECTED Final   Influenza B NOT DETECTED NOT DETECTED Final   Parainfluenza Virus 1 NOT DETECTED NOT DETECTED Final   Parainfluenza Virus 2 NOT DETECTED NOT DETECTED Final   Parainfluenza Virus 3 NOT DETECTED NOT DETECTED Final   Parainfluenza Virus 4 NOT DETECTED NOT DETECTED Final   Respiratory Syncytial Virus NOT DETECTED NOT DETECTED Final   Bordetella pertussis NOT DETECTED NOT DETECTED Final   Chlamydophila pneumoniae NOT DETECTED NOT DETECTED Final   Mycoplasma pneumoniae NOT DETECTED NOT DETECTED Final    Comment: Performed at [facility], [place] [address], [place], [place] [address]  MRSA PCR Screening     Status: None   Collection Time: 03/01/17  9:40 AM  Result Value Ref Range Status   MRSA by PCR NEGATIVE NEGATIVE Final    Comment:        The GeneXpert MRSA Assay (FDA approved for NASAL specimens only), is one component of a comprehensive MRSA colonization surveillance program. It is not intended to diagnose MRSA infection nor to guide or monitor treatment for MRSA infections. Performed at [facility], [place] [address] [place],  [address]       Studies:  Lenora Boys Tube Plc W/fl W/rad  Result Date: 03/09/2017 CLINICAL DATA:  Malpositioned nasogastric tube. EXAM: NASO G TUBE PLACEMENT WITH FL AND WITH RAD CONTRAST:  30 cc Isovue-300 - administered into the gastric lumen FLUOROSCOPY TIME:  2 minutes, 36 seconds (13.1 mGy) COMPARISON:  None. FINDINGS: The patient did not wish to have the existing nasogastric tube removed and requested  the tube simply be advanced. As such, existing retracted nasogastric tube was advanced utilizing intermittent fluoroscopic guidance with tip ultimately terminating within the gastric antrum. Contrast injection confirmed appropriate positioning and functionality. IMPRESSION: Fluoroscopic guided advancement of existing nasogastric tube with tip terminating within the gastric antrum. The nasogastric tube is ready for immediate use. Electronically Signed   By: Sandi Mariscal M.D.   On: 03/09/2017 11:43    Assessment/Plan: 84 y.o.  [place] man with a working diagnosis of angioimmunoblastic T-cell non-Hodgkin's lymphoma diagnosed by LN biopsy 03/04/2017, admitted for chemotherapy and monitoring for possible tumor lysis  (1) received dose-reduced CHOP chemotherapy 03/08/2017  (a) continues on prednisone daily  (b) daily labs to monitor for  tumor lysis (unlikely)  (2) severe septal deviation/ nasal obstruction, resulting in mouth-breathing, dry mouth, altered taste, food aversion, significant weight loss  (a) on marinol  (b) ng placed 2/20--feedings ongoing  (c) may benefit from saline mouth rinses PRN  (d) discussed remeron-- "it made me crazy" in past  (3) longstanding stool and bladder incontinence due to remote spinal surgery  (a) add precautions  (b) desitin to perianal area BID  (c) to sit on BSC twice daily to try to train bowels to evacuate  (d) to use urinal Q2h to keep bladder empty  (e) add questran PRN (4) rash over arms  ((a) added benadryl cream PRN  (5) headache-- likely secondary to sinus  (a) added loratadine 03/09/2017-- improved  (6) malnutrition, severe: family is interested in checking vitamin and thyroid levels-- will write orders . Overall patient is doing better and labs show no evidence of TLS. Uric acid is a bit high and I increased allopurinol.  Family is very concerned patient will be discharged "before he's ready." He may well need placement/Rehab facility. Rehab  eval is pending  Chauncey Cruel, MD 03/10/2017  11:32 AM Medical Oncology and Hematology [facility] [address] [place], [place] [address] Tel. [phone]    Fax. [phone]

## 2017-03-11 DIAGNOSIS — E883 Tumor lysis syndrome: Secondary | ICD-10-CM

## 2017-03-11 LAB — CBC
HCT: 27.9 % — ABNORMAL LOW (ref 39.0–52.0)
Hemoglobin: 9.3 g/dL — ABNORMAL LOW (ref 13.0–17.0)
MCH: 32.5 pg (ref 26.0–34.0)
MCHC: 33.3 g/dL (ref 30.0–36.0)
MCV: 97.6 fL (ref 78.0–100.0)
PLATELETS: 156 10*3/uL (ref 150–400)
RBC: 2.86 MIL/uL — ABNORMAL LOW (ref 4.22–5.81)
RDW: 15.9 % — ABNORMAL HIGH (ref 11.5–15.5)
WBC: 6.5 10*3/uL (ref 4.0–10.5)

## 2017-03-11 LAB — COMPREHENSIVE METABOLIC PANEL
ALK PHOS: 51 U/L (ref 38–126)
ALT: 21 U/L (ref 17–63)
ANION GAP: 8 (ref 5–15)
AST: 20 U/L (ref 15–41)
Albumin: 2 g/dL — ABNORMAL LOW (ref 3.5–5.0)
BUN: 40 mg/dL — ABNORMAL HIGH (ref 6–20)
CALCIUM: 8.5 mg/dL — AB (ref 8.9–10.3)
CHLORIDE: 104 mmol/L (ref 101–111)
CO2: 24 mmol/L (ref 22–32)
CREATININE: 1.37 mg/dL — AB (ref 0.61–1.24)
GFR, EST AFRICAN AMERICAN: 53 mL/min — AB (ref >60)
GFR, EST NON AFRICAN AMERICAN: 46 mL/min — AB (ref >60)
Glucose, Bld: 138 mg/dL — ABNORMAL HIGH (ref 65–99)
Potassium: 4.2 mmol/L (ref 3.5–5.1)
Sodium: 136 mmol/L (ref 135–145)
Total Bilirubin: 0.4 mg/dL (ref 0.3–1.2)
Total Protein: 7.1 g/dL (ref 6.5–8.1)

## 2017-03-11 LAB — GLUCOSE, CAPILLARY
GLUCOSE-CAPILLARY: 125 mg/dL — AB (ref 65–99)
GLUCOSE-CAPILLARY: 173 mg/dL — AB (ref 65–99)
Glucose-Capillary: 128 mg/dL — ABNORMAL HIGH (ref 65–99)
Glucose-Capillary: 132 mg/dL — ABNORMAL HIGH (ref 65–99)
Glucose-Capillary: 130 mg/dL — ABNORMAL HIGH (ref 65–99)
Glucose-Capillary: 170 mg/dL — ABNORMAL HIGH (ref 65–99)

## 2017-03-11 LAB — URIC ACID: URIC ACID, SERUM: 8.5 mg/dL — AB (ref 4.4–7.6)

## 2017-03-11 LAB — FOLATE: Folate: 14.7 ng/mL (ref >5.9)

## 2017-03-11 LAB — TSH: TSH: 13.518 u[IU]/mL — AB (ref 0.350–4.500)

## 2017-03-11 LAB — T4, FREE: FREE T4: 0.82 ng/dL (ref 0.61–1.12)

## 2017-03-11 LAB — VITAMIN B12: Vitamin B-12: 1292 pg/mL — ABNORMAL HIGH (ref 180–914)

## 2017-03-11 MED ORDER — FUROSEMIDE 20 MG PO TABS
20.0000 mg | ORAL_TABLET | Freq: Once | ORAL | Status: AC
  Administered 2017-03-11: 20 mg via ORAL
  Filled 2017-03-11: qty 1

## 2017-03-11 MED ORDER — TBO-FILGRASTIM 480 MCG/0.8ML ~~LOC~~ SOSY
480.0000 ug | PREFILLED_SYRINGE | Freq: Every day | SUBCUTANEOUS | Status: AC
  Administered 2017-03-11: 480 ug via SUBCUTANEOUS
  Filled 2017-03-11: qty 0.8

## 2017-03-11 NOTE — Progress Notes (Signed)
CSW following to assist with disposition. Pt was assessed by previous CSW last week- at that time was undecided about if they wanted to plan for SNF for pt at [place] or for 24-hour care at home.  Checked in with pt/family today (son Jonni Sanger at bedside and daughter Cyndi via phone). At this time feel due to pt's "incontinence, weakness, not able to get up and walk much" they want to pursue SNF options. Discussed insurance pre-authorization process and differences between custodial care and short term rehab care. Family understanding. States pt has a long term care policy (they are looking into details of) and would consider private pay if [facility] did not authorize short term rehab. Therapy working with pt following this discussion- CSW explained to family that since pt has not worked with therapy since 03/07/17 prior to this- information from this session is needed prior to any SNF being able to pursue insurance auth. Again, family understanding and adaptable. Daughter agreed to have CSW make referrals to area SNFs in order to start investigating options. States pt's chemo therapy plan currently is every 3 weeks at [facility].  Plan: pursuing SNF at [place], uncertain whether for ST rehab or custodial care as of yet.  Barriers: bed offers, insurance authorization, NG tube.  Sharren Bridge, MSW, LCSW Clinical Social Work 03/11/2017 [phone]

## 2017-03-11 NOTE — Progress Notes (Signed)
[facility] PROGRESS NOTE  Jose Meza XBL:[id number] DOB: 04/18/1931 DOA: 02/27/2017  PCP: Lajean Manes, MD  Brief History/Interval Summary: 84 year old Caucasian male with a past medical history of rheumatoid arthritis chronically on steroids, hypertension was hospitalized due to extensive lymphadenopathy detected on imaging studies.  Patient's symptoms included chills fever weight loss shortness of breath.  Concern was for lymphoma.  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  Patient was given chemotherapeutic agents on 2/22.  He is currently requiring tube feedings through Dobbhoff tube.  Reason for Visit: angioimmunoblastic T-cell lymphoma.  Consultants: Oncology.  General surgery.  Procedures:  Transthoracic echocardiogram Study Conclusions  - Procedure narrative: Transthoracic echocardiography. Image   quality was suboptimal. The study was technically difficult, as a   result of body habitus. - Left ventricle: The cavity size was normal. There was moderate   concentric hypertrophy. Systolic function was vigorous. The   estimated ejection fraction was in the range of 65% to 70%. - Aortic valve: Calcified with mild to moderate stenosis and   trivial regurgitation. Mean gradient (S): 13 mm Hg. Peak gradient   (S): 23 mm Hg. Valve area (VTI): 1.56 cm^2. Valve area (Vmax):   1.56 cm^2. Valve area (Vmean): 1.6 cm^2. - Aorta: Dilated aortic root. Aortic root dimension: 41 mm (ED). - Mitral valve: Mildly thickened leaflets . There was trivial   regurgitation. - Left atrium: The atrium was mildly dilated. - Tricuspid valve: There was mild regurgitation. - Pulmonary arteries: PA peak pressure: 54 mm Hg (S). - Inferior vena cava: The vessel was dilated. The respirophasic   diameter changes were blunted (< 50%), consistent with elevated   central venous pressure. - Pericardium, extracardiac: A trivial pericardial effusion was   identified posterior  to the heart.  Impressions: - Technically difficult study. LVEF 65-70%, moderate LVH, grossly   normal wall motion, grade 1 DD, mild to moderate aortic stenosis   - trivial AI, estimated AVA around 1.5 cm2, dilated aortic root   to 4.1 cm, mild TR, RVSP 54 mmHg, dilated IVC, trivial posterior   pericardial effusion.  US Guided Biopsy of L inguinal LN 02/28/17  Excisional Biopsy of L inguinal LN 03/04/17  Feeding tube placement under fluoroscopy  PICC line placed 2/21  Reduced dose R CHOP chemotherapy administered 2/22  Antibiotics: Patient has completed a course of antibiotics with ceftriaxone and azithromycin  Subjective/Interval History: Patient states that his appetite seems to be improving.  He had more food yesterday that he has previously.  Denies any nausea vomiting.  Diarrhea appears to have subsided.  Denies any abdominal pain.     Objective:  Vital Signs  Vitals:   03/10/17 2144 03/10/17 2210 03/10/17 2300 03/11/17 0652  BP: 124/61   133/72  Pulse: 99   92  Resp:  20  20  Temp:  98.3 F (36.8 C)  (!) 97.5 F (36.4 C)  TempSrc:  Oral  Oral  SpO2:  95%  96%  Weight:   96.2 kg (212 lb 1.3 oz)   Height:        Intake/Output Summary (Last 24 hours) at 03/11/2017 0837 Last data filed at 03/11/2017 3007 Gross per 24 hour  Intake 1568.67 ml  Output 2550 ml  Net -981.33 ml   Filed Weights   03/07/17 0515 03/08/17 1459 03/10/17 2300  Weight: 97.8 kg (215 lb 9.8 oz) 96.2 kg (212 lb 1.3 oz) 96.2 kg (212 lb 1.3 oz)    General appearance: Awake alert.  In  no distress. Resp: Improvement in respiratory status noted.  Normal effort.  No wheezing rales or rhonchi. Cardio: S1-S2 is normal regular.  No S3-S4.  No rubs murmurs or bruit GI: Abdomen remains soft.  Nontender nondistended Extremities: Continues to have pitting edema bilateral lower extremities. Neurologic: No focal neurological deficits.  Lab Results:  Data Reviewed: I have personally reviewed  following labs and imaging studies  CBC: Recent Labs  Lab 03/07/17 0519 03/08/17 0517 03/09/17 0500 03/10/17 0500 03/11/17 0500  WBC 10.8* 9.6 12.5* 8.1 6.5  HGB 11.1* 11.2* 10.1* 9.7* 9.3*  HCT 34.0* 35.0* 31.2* 29.7* 27.9*  MCV 100.3* 101.4* 101.0* 100.7* 97.6  PLT 196 184 181 173 785    Basic Metabolic Panel: Recent Labs  Lab 03/05/17 0348 03/06/17 0523 03/07/17 0519 03/08/17 0517 03/09/17 0500 03/10/17 0500 03/11/17 0500  NA 136 136 136 134* 137 135 136  K 4.0 4.6 4.3 4.7 4.6 4.1 4.2  CL 108 109 109 106 105 103 104  CO2 19* 21* 20* 21* _0 GLUCOSE 91 134* 124* 129* 92 98 138*  BUN 35* 37* 37* 33* 40* 42* 40*  CREATININE 2.02* 1.85* 1.66* 1.52* 1.57* 1.55* 1.37*  CALCIUM 8.3* 8.5* 8.9 9.0 8.4* 8.4* 8.5*  MG 2.1 2.3  --   --   --  2.3  --     GFR: Estimated Creatinine Clearance: 51.5 mL/min (A) (by C-G formula based on SCr of 1.37 mg/dL (H)).  Liver Function Tests: Recent Labs  Lab 03/05/17 0348 03/06/17 0523 03/09/17 0500 03/10/17 0500 03/11/17 0500  AST _1 ALT 17 16* _2 ALKPHOS 44 55 54 54 51  BILITOT 0.5 0.6 0.4 0.5 0.4  PROT 6.0* 6.9 6.9 7.2 7.1  ALBUMIN 1.9* 1.9* 1.9* 2.1* 2.0*    CBG: Recent Labs  Lab 03/10/17 1803 03/10/17 1947 03/11/17 0122 03/11/17 0640 03/11/17 0814  GLUCAP 153* 155* 128* 132* 125*    Recent Results (from the past 240 hour(s))  MRSA PCR Screening     Status: None   Collection Time: 03/01/17  9:40 AM  Result Value Ref Range Status   MRSA by PCR NEGATIVE NEGATIVE Final    Comment:        The GeneXpert MRSA Assay (FDA approved for NASAL specimens only), is one component of a comprehensive MRSA colonization surveillance program. It is not intended to diagnose MRSA infection nor to guide or monitor treatment for MRSA infections. Performed at [facility], [place] [address] [place], [place] [address]       Radiology Studies: Dg Loyce Dys Tube Plc W/fl W/rad  Result  Date: 03/09/2017 CLINICAL DATA:  Malpositioned nasogastric tube. EXAM: NASO G TUBE PLACEMENT WITH FL AND WITH RAD CONTRAST:  30 cc Isovue-300 - administered into the gastric lumen FLUOROSCOPY TIME:  2 minutes, 36 seconds (13.1 mGy) COMPARISON:  None. FINDINGS: The patient did not wish to have the existing nasogastric tube removed and requested the tube simply be advanced. As such, existing retracted nasogastric tube was advanced utilizing intermittent fluoroscopic guidance with tip ultimately terminating within the gastric antrum. Contrast injection confirmed appropriate positioning and functionality. IMPRESSION: Fluoroscopic guided advancement of existing nasogastric tube with tip terminating within the gastric antrum. The nasogastric tube is ready for immediate use. Electronically Signed   By: Sandi Mariscal M.D.   On: 03/09/2017 11:43     Medications:  Scheduled: . allopurinol  100 mg Oral Daily  . dronabinol  5 mg Oral BID AC  . feeding supplement (ENSURE ENLIVE)  237 mL Oral BID BM  . feeding supplement (PRO-STAT SUGAR FREE 64)  30 mL Per Tube Daily  . finasteride  5 mg Oral Daily  . fluticasone  1 spray Each Nare Daily  . furosemide  20 mg Oral Once  . heparin injection (subcutaneous)  5,000 Units Subcutaneous Q8H  . liver oil-zinc oxide   Topical BID  . LORazepam  1 mg Oral QHS  . metoprolol tartrate  75 mg Oral BID  . multivitamin with minerals  1 tablet Oral Daily  . pantoprazole  40 mg Oral Daily  . predniSONE  60 mg Oral Q breakfast  . sodium chloride flush  3 mL Intravenous Q12H  . tamsulosin  0.4 mg Oral Daily   Continuous: . sodium chloride    . feeding supplement (OSMOLITE 1.5 CAL) 1,000 mL (03/10/17 1826)  . ondansetron (ZOFRAN) IVPB     WYO:VZCHYI chloride, acetaminophen, albuterol, alteplase, alum & mag hydroxide-simeth, benzonatate, diphenhydrAMINE-zinc acetate, guaiFENesin, heparin lock flush, heparin lock flush, ibuprofen, iopamidol, loperamide, LORazepam, ondansetron  (ZOFRAN) IVPB, sodium chloride, sodium chloride flush, sodium chloride flush, sodium chloride flush  Assessment/Plan:  Principal Problem:   Angioimmunoblastic lymphoma (HCC) Active Problems:   Hyperlipidemia   Hypertension   CKD (chronic kidney disease) stage 3, GFR 30-59 ml/min (HCC)   Rheumatoid arthritis of multiple sites with negative rheumatoid factor (HCC)   Pulmonary nodules/lesions, multiple   Generalized lymphadenopathy   Unintentional weight loss   Fever   Atherosclerotic peripheral vascular disease (HCC)   Rash    Acute respiratory failure with hypoxia Initially thought to be due to community-acquired pneumonia.  But patient also noted to have widespread lymphadenopathy and pulmonary nodules.  These could have also contributed.  Patient was started on antibiotics.  Due to persistent fevers patient was seen by infectious disease.  Influenza PCR and respiratory viral panel was negative.  Antibiotics were discontinued.  Patient was started on steroids after biopsy was performed.  Respiratory status continues to improve.  Continue supplemental oxygen, nebulizer treatments.  Wean down oxygen as tolerated.   Angioimmunoblastic T-cell lymphoma, Newly diagnosed Imaging study findings were concerning for lymphoma.  Patient underwent lymph node biopsy which showed a lymphoproliferative process but could not offer definitive diagnosis. HIV negative.  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  Patient underwent excisional lymph node biopsy on 2/18.  Final pathology suggested angioimmunoblastic T-cell lymphoma.  Patient seen by oncology.  Patient was given reduced dose R CHOP on 2/22.  Monitor for tumor lysis syndrome.  Uric acid level noted to be slightly better today.  Patient was already on allopurinol and the dose was increased on 2/24.  Bone marrow biopsy was postponed per family request.  Echocardiogram showed normal systolic function.     Maculopapular rash Patient was noted to have a maculopapular rash to his bilateral arms and chest and back.  This has improved most likely due to steroids. Etiology unclear.  Poor oral intake with likely moderate to severe protein calorie malnutrition Patient has lost weight.  Patient's son and daughter very concerned that patient has not been eating and drinking much.  Tube feeds were offered to the patient 2/19 but he declined.  This was again discussed with the patient on 2/20.  Feeding tube was inserted he was started on tube feedings.  Overnight on 2/21 the feeding tube got dislodged.  This was repositioned on 2/23.  Tube feedings  were reinitiated.  Patient reports that his appetite is improved.  We will request dietitian to reevaluate and perhaps start a calorie count today or tomorrow.  If he has adequate oral intake then possibly feeding tube can be removed in the next day or 2-3 days.  Lower extremity edema Lower extremity Doppler studies negative for DVT.  Swelling is most likely due to hypoalbuminemia.  We will given 1 dose of Lasix today.  Keep legs elevated.  Compression stockings.    Upper extremity swelling/superficial thrombosis No DVT was noted on upper extremity ultrasound.  Urinary tract infection/hematuria Urine culture positive for Klebsiella. Completed course of antibiotics.  Hypotension/tachycardia  These issues appear to be stable currently.  TSH was 6.45 earlier in this hospitalization. Repeated by oncology and noted to be 13 this morning.  Free T4 is 0.82.  This is all most likely sick euthyroid.  Patient does not have any symptoms suggestive of hypothyroidism.  We will continue to recommend repeating the studies in 4-6 weeks.  Normocytic anemia Hemoglobin is stable.  Continue to monitor A63 and folic acid levels are not deficient.  Leukocytosis This was likely due to steroids.  WBC is now normal.  History of rheumatoid arthritis Holding Xelijanz.  Acute  kidney injury on chronic kidney disease stage III Renal function is stable.  Continue to monitor urine output.  Essential hypertension Continue metoprolol.  Blood pressure is stable.  History of GERD Continue PPI  History of anxiety Continue Ativan  Deviated nasal septum Seen by ENT.  No acute issue for now.  Outpatient follow-up.  Flonase.  Ectatic infrarenal abdominal aorta Outpatient follow-up  DVT Prophylaxis: Subcutaneous heparin    Code Status: Full code Family Communication: Discussed with the patient and his children Disposition Plan: Management as outlined above.  PT and OT to continue to follow.    LOS: 11 days   [facility] Pager [phone] 03/11/2017, 8:37 AM  If 7PM-7AM, please contact night-coverage at [email or web address], password [email or web address]

## 2017-03-11 NOTE — Progress Notes (Signed)
HEMATOLOGY-ONCOLOGY PROGRESS NOTE  SUBJECTIVE: Cycle 1 day for mini CHOP Breathing is improving, fatigue and energy levels are improving. He is still  in bed, physical therapy planning to get him up  OBJECTIVE: REVIEW OF SYSTEMS:   Constitutional: Denies fevers, chills or abnormal weight loss Eyes: Denies blurriness of vision Ears, nose, mouth, throat, and face: Denies mucositis or sore throat Respiratory: Oxygen by nasal cannula Cardiovascular: Denies palpitation, chest discomfort Gastrointestinal: NG tube feeds, able to eat small amounts of food Skin: Denies abnormal skin rashes Lymphatics: Denies new lymphadenopathy or easy bruising Neurological:Denies numbness, tingling or new weaknesses Behavioral/Psych: Mood is stable, no new changes  Extremities: Left lower extremity edema  All other systems were reviewed with the patient and are negative.  I have reviewed the past medical history, past surgical history, social history and family history with the patient and they are unchanged from previous note.   PHYSICAL EXAMINATION: ECOG PERFORMANCE STATUS: 3 - Symptomatic, >50% confined to bed  Vitals:   03/11/17 0652 03/11/17 1253  BP: 133/72 138/74  Pulse: 92 88  Resp: 20 20  Temp: (!) 97.5 F (36.4 C) 98.2 F (36.8 C)  SpO2: 96% 97%   Filed Weights   03/07/17 0515 03/08/17 1459 03/10/17 2300  Weight: 215 lb 9.8 oz (97.8 kg) 212 lb 1.3 oz (96.2 kg) 212 lb 1.3 oz (96.2 kg)    GENERAL: Generalized weakness SKIN: skin color, texture, turgor are normal, no rashes or significant lesions EYES: normal, Conjunctiva are pink and non-injected, sclera clear OROPHARYNX:no exudate, no erythema and lips, buccal mucosa, and tongue normal  NECK: supple, thyroid normal size, non-tender, without nodularity LYMPH:  no palpable lymphadenopathy in the cervical, axillary or inguinal LUNGS: clear to auscultation and percussion with normal breathing effort HEART: regular rate & rhythm and no  murmurs and 2+ left lower extremity edema ABDOMEN:abdomen soft, non-tender and normal bowel sounds Musculoskeletal:no cyanosis of digits and no clubbing  NEURO: alert & oriented x 3 with fluent speech, no focal motor/sensory deficits  LABORATORY DATA:  I have reviewed the data as listed CMP Latest Ref Rng & Units 03/11/2017 03/10/2017 03/09/2017  Glucose 65 - 99 mg/dL 138(H) 98 92  BUN 6 - 20 mg/dL 40(H) 42(H) 40(H)  Creatinine 0.61 - 1.24 mg/dL 1.37(H) 1.55(H) 1.57(H)  Sodium 135 - 145 mmol/L 136 135 137  Potassium 3.5 - 5.1 mmol/L 4.2 4.1 4.6  Chloride 101 - 111 mmol/L 104 103 105  CO2 22 - 32 mmol/L 24 24 23   Calcium 8.9 - 10.3 mg/dL 8.5(L) 8.4(L) 8.4(L)  Total Protein 6.5 - 8.1 g/dL 7.1 7.2 6.9  Total Bilirubin 0.3 - 1.2 mg/dL 0.4 0.5 0.4  Alkaline Phos 38 - 126 U/L 51 54 54  AST 15 - 41 U/L 20 22 19   ALT 17 - 63 U/L 21 21 22     Lab Results  Component Value Date   WBC 6.5 03/11/2017   HGB 9.3 (L) 03/11/2017   HCT 27.9 (L) 03/11/2017   MCV 97.6 03/11/2017   PLT 156 03/11/2017   NEUTROABS 7.8 (H) 02/28/2017    ASSESSMENT AND PLAN: 1.  Angioimmunoblastic T-cell lymphoma: Today cycle 1 day 4. He appears to be having some evidence of tumor lysis. Uric acid levels have been slightly elevated. Renal function is stable. Clinically he is breathing appears to be improving. I recommend giving Neupogen or Granix daily until nadir (likely Friday)  2. euthyroid sick syndrome: This patient's daughter spent an enormous amount of time talking about  the thyroid issue.  I discussed with her that euthyroid sick syndrome is something that does get better on its own.  This is the reason why we generally do not check thyroid function and seriously ill patients.  However, patient's daughter who is apparently a Loss adjuster, chartered of some form of medicine, is requesting [place] to start him on low-dose thyroid replacement therapy. I will discussed with Dr. Bonnielee Haff and consider starting low-dose  Synthroid.  3.  Elevated uric acid 8.5: Due to tumor lysis it has remained stable over the past 2 days.  Continue daily monitoring.  If it crosses 10.0 then we will give the patient Rasburicase.  Patient is starting to eat more food and planning on getting stronger with the help of physical therapy he plans to get back on his feet.

## 2017-03-11 NOTE — Progress Notes (Signed)
Physical Therapy Treatment Patient Details Name: Jose Meza MRN: [id number] DOB: 02/01/1931 Today's Date: 03/11/2017    History of Present Illness  84 y.o. male with medical history significant of chronic kidney disease, rheumatoid arthritis chronically on steroids, hypertension transferred here from [facility] after finding diffuse disease in his chest concerning for lymphoma.  S/p excisional lymph node biopsy on 2/18.  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 Pt is currently having diffuculty with nearly constant incontinence of stool especially with movment     PT Comments    Son present during session and actively involved in [place].  Pt wants to go home rather than to a rehab facility.  He did well with bed mobility, to edge of bed and standing with +1 min-mod  Assist.  He was limited by continuous stool incontinence and need for constant peri care. Upgraded equipment recommendations for home and he will benefit from acute OT consult.  Will follow with acute PT to work on bed-BSC transfers as he is limited in sitting due to incontinence at this time.    Follow Up Recommendations  Home health PT;Supervision/Assistance - 24 hour     Equipment Recommendations  3in1 (PT);Wheelchair (measurements PT);Hospital bed;Other (comment)(pressure relief overlay for mattress due to skin compromise )    Recommendations for Other Services OT consult     Precautions / Restrictions Precautions Precautions: Fall Other Brace/Splint: nasal feeding tube Restrictions Weight Bearing Restrictions: No    Mobility  Bed Mobility Overal bed mobility: Needs Assistance Bed Mobility: Rolling;Sidelying to Sit Rolling: Min assist Sidelying to sit: Mod assist       General bed mobility comments: Pt incontinent of stool seveal times during mobility , but was able to bend legs up, bridge and assist with rolling.  He uses bedrails to help him pull up and over.  He reports he  needs to have the head of bed raised up or he gets short of breath   Transfers Overall transfer level: Needs assistance Equipment used: Rolling walker (2 wheeled) Transfers: Sit to/from Stand Sit to Stand: Min assist         General transfer comment: cues to push up with arms to stand.  Pt able to take small side steps to head of bed to help position   Ambulation/Gait Ambulation/Gait assistance: Min assist Ambulation Distance (Feet): 2 Feet(small side steps )             Stairs            Wheelchair Mobility    Modified Rankin (Stroke Patients Only)       Balance Overall balance assessment: Modified Independent(sit at edge of bed with hands on bed, RW for standing )                                          Cognition Arousal/Alertness: Awake/alert Behavior During Therapy: WFL for tasks assessed/performed Overall Cognitive Status: Within Functional Limits for tasks assessed                                 General Comments: Pt appears to be overwhelmed with all his physical problems and feels the priorty is to eat and dring       Exercises General Exercises - Lower Extremity Ankle Circles/Pumps: AROM;Strengthening;Left;10 reps Heel Slides: AROM;Right;Left;10 reps  Hip ABduction/ADduction: AAROM;Right;Left;10 reps Straight Leg Raises: AROM;Right;Left;5 reps    General Comments General comments (skin integrity, edema, etc.): Pt with pitting edema in both arms, R>L and also both lower legs L>R  Tube feeling disconnected for activity. Skin on buttocks and anal area very red.  Long discussion with patient and son about equipment for home and schedule for exercise and out of bed to bedside commode and sidelying in bed for pressure relief       Pertinent Vitals/Pain Pain Assessment: No/denies pain    Home Living                      Prior Function            PT Goals (current goals can now be found in the care plan  section) Acute Rehab PT Goals Patient Stated Goal: to go home  PT Goal Formulation: With patient Time For Goal Achievement: 03/15/17 Potential to Achieve Goals: Good Progress towards PT goals: Progressing toward goals    Frequency    Min 3X/week      PT Plan Discharge plan needs to be updated    Co-evaluation              AM-PAC PT "6 Clicks" Daily Activity  Outcome Measure  Difficulty turning over in bed (including adjusting bedclothes, sheets and blankets)?: A Lot Difficulty moving from lying on back to sitting on the side of the bed? : A Lot Difficulty sitting down on and standing up from a chair with arms (e.g., wheelchair, bedside commode, etc,.)?: A Little Help needed moving to and from a bed to chair (including a wheelchair)?: A Lot Help needed walking in hospital room?: A Lot Help needed climbing 3-5 steps with a railing? : Total 6 Click Score: 12    End of Session   Activity Tolerance: Treatment limited secondary to medical complications (Comment)(pt with stool incontinence with all activity ) Patient left: in bed;with call bell/phone within reach;with family/visitor present   PT Visit Diagnosis: Unsteadiness on feet (R26.81);Other abnormalities of gait and mobility (R26.89);Muscle weakness (generalized) (M62.81);Difficulty in walking, not elsewhere classified (R26.2)     Time: 2637-8588 PT Time Calculation (min) (ACUTE ONLY): 1 min  Charges:  $Therapeutic Exercise: 8-22 mins $Therapeutic Activity: 23-37 mins $Self Care/Home Management: 8-22                    G Codes:       Rafaela Dinius K. Owens Shark, PT    Norwood Levo 03/11/2017, 10:15 AM

## 2017-03-11 NOTE — Progress Notes (Signed)
  Speech Language Pathology Treatment: Dysphagia  Patient Details Name: Jose Meza MRN: [id number] DOB: 07/16/1931 Today's Date: 03/11/2017 Time: 4696-2952 SLP Time Calculation (min) (ACUTE ONLY): 31 min  Assessment / Plan / Recommendation Clinical Impression  Pt's diet has been advanced to regular textures and thin liquids by MD, and he reports improving swallow function with less coughing despite not using his chin tuck. He does however say that food still feels like it gets "stuck in [his] esophagus" and/or "moves slowly." He uses thin liquids to help with this and with his dry mouth. SLP provided skilled observation with thin liquids, regular solids, and soft solids. One immediate throat clear was noted following a bite of dry egg. SLP provided Mod A to manipulate the food on his tray to make it more moist with no further signs of aspiration noted - pt did not use his chin tuck.   SLP provided demonstrations and education about food textures that might be easier to swallow, how to manipulate trays, and aspiration/esophageal precautions. We discussed the option of softer diets, but pt did eat some regular textures without overt difficulty and so my preference would be to restrict as little as possible while his intake has been low. He and his son are in agreement. SLP will continue to follow acutely.   HPI HPI: 84 y.o. male with medical history significant of chronic kidney disease, rheumatoid arthritis chronically on steroids, hypertension transferred here from [facility] after finding use disease in his chest concerning for lymphoma.  Patient arrived there with complaints of coughing for over a week with chills and low-grade fevers at home.  Is lost over 20 pounds in the last 4-6 weeks.  He is recently been treated for prostatitis with an unknown antibiotic a month ago.  His symptoms have persisted however despite recent steroid taper for bronchitis also.  He denies any nausea  vomiting or diarrhea.  He has not noticed any swollen areas on his body lately.  He denies any rashes.  CTA was done which showed diffuse disease concerning for lymphoma with possible pneumonia.  Patient is referred for admission for hypoxia with 90% O2 sats on room air along with possible pneumonia.  He denies any pain.      SLP Plan  Continue with current plan of care       Recommendations  Diet recommendations: Regular;Thin liquid Liquids provided via: Cup Medication Administration: Whole meds with puree Supervision: Patient able to self feed;Full supervision/cueing for compensatory strategies Compensations: Slow rate;Small sips/bites;Minimize environmental distractions(rest breaks PRN) Postural Changes and/or Swallow Maneuvers: Seated upright 90 degrees                Oral Care Recommendations: Oral care BID Follow up Recommendations: None SLP Visit Diagnosis: Dysphagia, unspecified (R13.10) Plan: Continue with current plan of care       GO                Germain Osgood 03/11/2017, 12:06 PM  Germain Osgood, M.A. CCC-SLP [phone]

## 2017-03-11 NOTE — Progress Notes (Signed)
Calorie Count Note  48 hour calorie count ordered per MD.  Please hang calorie count envelope on the patient's door. Document percent consumed for each item on the patient's meal tray ticket and keep in envelope. Also document percent of any supplement or snack pt consumes and keep documentation in envelope for RD to review.   Clayton Bibles, MS, RD, [place] Dietitian Pager: [phone] After Hours Pager: [phone]

## 2017-03-11 NOTE — Progress Notes (Addendum)
Nutrition Follow-up  INTERVENTION:   -Calorie Count in progress. See separate notes.  -Pt's family to bring in protein supplements for patient. -Continue Magic cup BID with meals, each supplement provides 290 kcal and 9 grams of protein  -Continue Osmolite 1.5 @ 60 ml/hr + 30 ml Prostat daily via NGT. Providing 2260 kcal, 105g protein and 1097 ml H2O.    NUTRITION DIAGNOSIS:   Inadequate oral intake related to acute illness, decreased appetite as evidenced by per patient/family report.  Ongoing.  GOAL:   Patient will meet greater than or equal to 90% of their needs  Meeting with TF.  MONITOR:   PO intake, Supplement acceptance, TF tolerance, Weight trends  ASSESSMENT:   84 y.o. male with medical history significant for CKD, rheumatoid arthritis chronically on steroids, HTN. He transferred from [facility] after findings concerning for lymphoma.  Patient arrived there with complaints of coughing for over a week with chills and low-grade fevers at home. He reports losing over 20 pounds in the last 4-6 weeks. He was recently treated (a month ago) for prostatitis with an unknown antibiotic.  RD contacted by RN regarding pt's family wanting to speak with RD about diet options and recommendations.  Pt in room with daughter at bedside. Pt reports tolerating Osmolite 1.5 @ 60 ml/hr -this was confirmed with RN. Pt eager to eat so he can have NGT removed. Explained that Calorie Count is in progress and RD will calculate the calories and protein he has consumed today. If patient can meet most of his estimated needs PO, then tube can be removed.  Pt states he likes dairy. Pt's daughter states that the patient has had IBS-C for 50 years and pt doesn't tolerate it very well. Pt states that he still enjoys ice cream.   Pt's daughter states the family is adamantly against Ensure and other products like this. They would prefer if he can drink plant-based supplements. Pt had a supplement  called Evolve at bedside. Pt states he does not like this supplement. Pt's daughter states that pt was drinking homemade protein shakes using plant based protein powder at home and he liked this. Reviewed options to add to protein shakes to make them as calorically dense as possible. Pt's family very against added sugar consumption.   Pt states he has not been eating well at all. RD states that it is more important for pt to NOT restrict his intake since he is at risk of malnutrition.   Pt states he continues to have trouble with tastes. Encouraged pt to try rinsing his mouth before and after eating.   Medications: Marinol capsule BID, Multivitamin with minerals daily, Imodium PRN Labs reviewed: CBGs: 125-130  Diet Order:  Diet regular Room service appropriate? Yes; Fluid consistency: Thin  EDUCATION NEEDS:   No education needs have been identified at this time  Skin:  Skin Assessment: Reviewed RN Assessment  Last BM:  2/19  Height:   Ht Readings from Last 1 Encounters:  03/01/17 6\' 5"  (1.956 m)    Weight:   Wt Readings from Last 1 Encounters:  03/10/17 212 lb 1.3 oz (96.2 kg)    Ideal Body Weight:  94.54 kg  BMI:  Body mass index is 25.15 kg/m.  Estimated Nutritional Needs:   Kcal:  2536-6440 (24-26 kcal/kg)  Protein:  110-127 grams (1.2-1.4 grams/kg)  Fluid:  >/= 2.2 L/day  Jose Bibles, MS, RD, LDN [place] Dietitian Pager: [phone] After Hours Pager: [phone]

## 2017-03-12 DIAGNOSIS — D649 Anemia, unspecified: Secondary | ICD-10-CM

## 2017-03-12 DIAGNOSIS — D72829 Elevated white blood cell count, unspecified: Secondary | ICD-10-CM

## 2017-03-12 LAB — T3, FREE: T3, Free: 1.4 pg/mL — ABNORMAL LOW (ref 2.0–4.4)

## 2017-03-12 LAB — BASIC METABOLIC PANEL
Anion gap: 9 (ref 5–15)
BUN: 40 mg/dL — AB (ref 6–20)
CHLORIDE: 101 mmol/L (ref 101–111)
CO2: 25 mmol/L (ref 22–32)
CREATININE: 1.29 mg/dL — AB (ref 0.61–1.24)
Calcium: 8.2 mg/dL — ABNORMAL LOW (ref 8.9–10.3)
GFR calc Af Amer: 57 mL/min — ABNORMAL LOW (ref >60)
GFR calc non Af Amer: 50 mL/min — ABNORMAL LOW (ref >60)
GLUCOSE: 91 mg/dL (ref 65–99)
POTASSIUM: 4.1 mmol/L (ref 3.5–5.1)
Sodium: 135 mmol/L (ref 135–145)

## 2017-03-12 LAB — CBC
HEMATOCRIT: 27.9 % — AB (ref 39.0–52.0)
HEMOGLOBIN: 9.3 g/dL — AB (ref 13.0–17.0)
MCH: 33.3 pg (ref 26.0–34.0)
MCHC: 33.3 g/dL (ref 30.0–36.0)
MCV: 100 fL (ref 78.0–100.0)
Platelets: 193 10*3/uL (ref 150–400)
RBC: 2.79 MIL/uL — ABNORMAL LOW (ref 4.22–5.81)
RDW: 16.2 % — ABNORMAL HIGH (ref 11.5–15.5)
WBC: 31.6 10*3/uL — ABNORMAL HIGH (ref 4.0–10.5)

## 2017-03-12 LAB — GLUCOSE, CAPILLARY
GLUCOSE-CAPILLARY: 92 mg/dL (ref 65–99)
GLUCOSE-CAPILLARY: 90 mg/dL (ref 65–99)
GLUCOSE-CAPILLARY: 135 mg/dL — AB (ref 65–99)
Glucose-Capillary: 88 mg/dL (ref 65–99)
Glucose-Capillary: 115 mg/dL — ABNORMAL HIGH (ref 65–99)

## 2017-03-12 LAB — HOMOCYSTEINE: Homocysteine: 8.3 umol/L (ref 0.0–15.0)

## 2017-03-12 LAB — URIC ACID: Uric Acid, Serum: 7.4 mg/dL (ref 4.4–7.6)

## 2017-03-12 MED ORDER — FUROSEMIDE 20 MG PO TABS
20.0000 mg | ORAL_TABLET | Freq: Once | ORAL | Status: DC
  Filled 2017-03-12: qty 1

## 2017-03-12 MED ORDER — PANCRELIPASE (LIP-PROT-AMYL) 12000-38000 UNITS PO CPEP
12000.0000 [IU] | ORAL_CAPSULE | Freq: Once | ORAL | Status: AC
  Administered 2017-03-12: 12000 [IU] via ORAL
  Filled 2017-03-12: qty 1

## 2017-03-12 MED ORDER — SODIUM BICARBONATE 650 MG PO TABS
650.0000 mg | ORAL_TABLET | Freq: Once | ORAL | Status: AC
  Administered 2017-03-12: 650 mg via ORAL
  Filled 2017-03-12: qty 1

## 2017-03-12 NOTE — Progress Notes (Signed)
2 attempts to unclog tube per guideline orders with no success noted. Patient has refused oral  meds for now and states that he will take them when he absolutely has to. Dr. [place] Pink paged and made aware of above.

## 2017-03-12 NOTE — Progress Notes (Addendum)
[facility] PROGRESS NOTE  Jose Meza:[id number] DOB: 1931-12-17 DOA: 02/27/2017  PCP: Lajean Manes, MD  Brief History/Interval Summary: 84 year old Caucasian male with a past medical history of rheumatoid arthritis chronically on steroids, hypertension was hospitalized due to extensive lymphadenopathy detected on imaging studies.  Patient's symptoms included chills fever weight loss shortness of breath.  Concern was for lymphoma.  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  Patient was given chemotherapeutic agents on 2/22.  He is currently requiring tube feedings through Dobbhoff tube.  Reason for Visit: angioimmunoblastic T-cell lymphoma.  Consultants: Oncology.  General surgery.  Procedures:  Transthoracic echocardiogram Study Conclusions  - Procedure narrative: Transthoracic echocardiography. Image   quality was suboptimal. The study was technically difficult, as a   result of body habitus. - Left ventricle: The cavity size was normal. There was moderate   concentric hypertrophy. Systolic function was vigorous. The   estimated ejection fraction was in the range of 65% to 70%. - Aortic valve: Calcified with mild to moderate stenosis and   trivial regurgitation. Mean gradient (S): 13 mm Hg. Peak gradient   (S): 23 mm Hg. Valve area (VTI): 1.56 cm^2. Valve area (Vmax):   1.56 cm^2. Valve area (Vmean): 1.6 cm^2. - Aorta: Dilated aortic root. Aortic root dimension: 41 mm (ED). - Mitral valve: Mildly thickened leaflets . There was trivial   regurgitation. - Left atrium: The atrium was mildly dilated. - Tricuspid valve: There was mild regurgitation. - Pulmonary arteries: PA peak pressure: 54 mm Hg (S). - Inferior vena cava: The vessel was dilated. The respirophasic   diameter changes were blunted (< 50%), consistent with elevated   central venous pressure. - Pericardium, extracardiac: A trivial pericardial effusion was   identified posterior  to the heart.  Impressions: - Technically difficult study. LVEF 65-70%, moderate LVH, grossly   normal wall motion, grade 1 DD, mild to moderate aortic stenosis   - trivial AI, estimated AVA around 1.5 cm2, dilated aortic root   to 4.1 cm, mild TR, RVSP 54 mmHg, dilated IVC, trivial posterior   pericardial effusion.  US Guided Biopsy of L inguinal LN 02/28/17  Excisional Biopsy of L inguinal LN 03/04/17  Feeding tube placement under fluoroscopy  PICC line placed 2/21  Reduced dose R CHOP chemotherapy administered 2/22  Antibiotics: Patient has completed a course of antibiotics with ceftriaxone and azithromycin  Subjective/Interval History: Patient continues to feel well.  Denies any new complaints.  He is trying to eat more.  He understands that calorie count is in progress.  Apparently his feeding tube got clogged overnight.  Requesting that he get therapy every day.   Objective:  Vital Signs  Vitals:   03/11/17 0652 03/11/17 1253 03/11/17 2121 03/12/17 0411  BP: 133/72 138/74 123/73 133/76  Pulse: 92 88 98 87  Resp: _0 Temp: (!) 97.5 F (36.4 C) 98.2 F (36.8 C) 98.1 F (36.7 C) 97.9 F (36.6 C)  TempSrc: Oral Oral Oral Oral  SpO2: 96% 97% 97% 95%  Weight:      Height:        Intake/Output Summary (Last 24 hours) at 03/12/2017 0858 Last data filed at 03/12/2017 0348 Gross per 24 hour  Intake 540 ml  Output 2850 ml  Net -2310 ml   Filed Weights   03/07/17 0515 03/08/17 1459 03/10/17 2300  Weight: 97.8 kg (215 lb 9.8 oz) 96.2 kg (212 lb 1.3 oz) 96.2 kg (212 lb 1.3 oz)  General appearance: Awake alert.  In no distress. Resp: Normal effort.  No wheezing rales or rhonchi. Cardio: S1-S2 is normal regular.  No S3-S4.  No rubs murmurs or bruit GI: Abdomen remains soft.  Nontender nondistended Extremities: Pitting edema bilateral lower extremities Neurologic: No focal neurological deficits.  Lab Results:  Data Reviewed: I have personally reviewed  following labs and imaging studies  CBC: Recent Labs  Lab 03/08/17 0517 03/09/17 0500 03/10/17 0500 03/11/17 0500 03/12/17 0542  WBC 9.6 12.5* 8.1 6.5 31.6*  HGB 11.2* 10.1* 9.7* 9.3* 9.3*  HCT 35.0* 31.2* 29.7* 27.9* 27.9*  MCV 101.4* 101.0* 100.7* 97.6 100.0  PLT 184 181 173 156 119    Basic Metabolic Panel: Recent Labs  Lab 03/06/17 0523  03/08/17 0517 03/09/17 0500 03/10/17 0500 03/11/17 0500 03/12/17 0542  NA 136   < > 134* 137 135 136 135  K 4.6   < > 4.7 4.6 4.1 4.2 4.1  CL 109   < > 106 105 103 104 101  CO2 21*   < > 21* _0 GLUCOSE 134*   < > 129* 92 98 138* 91  BUN 37*   < > 33* 40* 42* 40* 40*  CREATININE 1.85*   < > 1.52* 1.57* 1.55* 1.37* 1.29*  CALCIUM 8.5*   < > 9.0 8.4* 8.4* 8.5* 8.2*  MG 2.3  --   --   --  2.3  --   --    < > = values in this interval not displayed.    GFR: Estimated Creatinine Clearance: 54.7 mL/min (A) (by C-G formula based on SCr of 1.29 mg/dL (H)).  Liver Function Tests: Recent Labs  Lab 03/06/17 0523 03/09/17 0500 03/10/17 0500 03/11/17 0500  AST _1 ALT 16* _2 ALKPHOS 55 54 54 51  BILITOT 0.6 0.4 0.5 0.4  PROT 6.9 6.9 7.2 7.1  ALBUMIN 1.9* 1.9* 2.1* 2.0*    CBG: Recent Labs  Lab 03/11/17 1213 03/11/17 1942 03/11/17 2331 03/12/17 0404 03/12/17 0755  GLUCAP 130* 173* 170* 88 92    No results found for this or any previous visit (from the past 240 hour(s)).    Radiology Studies: No results found.   Medications:  Scheduled: . allopurinol  100 mg Oral Daily  . dronabinol  5 mg Oral BID AC  . feeding supplement (ENSURE ENLIVE)  237 mL Oral BID BM  . feeding supplement (PRO-STAT SUGAR FREE 64)  30 mL Per Tube Daily  . finasteride  5 mg Oral Daily  . fluticasone  1 spray Each Nare Daily  . furosemide  20 mg Oral Once  . heparin injection (subcutaneous)  5,000 Units Subcutaneous Q8H  . lipase/protease/amylase  12,000 Units Oral Once   And  . sodium bicarbonate  650 mg Oral  Once  . liver oil-zinc oxide   Topical BID  . LORazepam  1 mg Oral QHS  . metoprolol tartrate  75 mg Oral BID  . multivitamin with minerals  1 tablet Oral Daily  . pantoprazole  40 mg Oral Daily  . predniSONE  60 mg Oral Q breakfast  . sodium chloride flush  3 mL Intravenous Q12H  . tamsulosin  0.4 mg Oral Daily   Continuous: . sodium chloride    . feeding supplement (OSMOLITE 1.5 CAL) 1,000 mL (03/11/17 1343)  . ondansetron (ZOFRAN) IVPB     ERD:EYCXKG chloride, acetaminophen, albuterol, alteplase, alum & mag hydroxide-simeth, benzonatate, diphenhydrAMINE-zinc  acetate, guaiFENesin, heparin lock flush, heparin lock flush, ibuprofen, iopamidol, loperamide, LORazepam, ondansetron (ZOFRAN) IVPB, sodium chloride, sodium chloride flush, sodium chloride flush, sodium chloride flush  Assessment/Plan:  Principal Problem:   Angioimmunoblastic lymphoma (HCC) Active Problems:   Hyperlipidemia   Hypertension   CKD (chronic kidney disease) stage 3, GFR 30-59 ml/min (HCC)   Rheumatoid arthritis of multiple sites with negative rheumatoid factor (HCC)   Pulmonary nodules/lesions, multiple   Generalized lymphadenopathy   Unintentional weight loss   Fever   Atherosclerotic peripheral vascular disease ([place])   Rash    Acute respiratory failure with hypoxia Initially thought to be due to community-acquired pneumonia.  But patient also noted to have widespread lymphadenopathy and pulmonary nodules.  These could have also contributed.  Patient was started on antibiotics.  Due to persistent fevers patient was seen by infectious disease.  Influenza PCR and respiratory viral panel was negative.  Antibiotics were discontinued.  Patient was started on steroids after biopsy was performed.  Patient's respiratory status continues to improve.  Continue supplemental oxygen.  Wean down as tolerated.    Angioimmunoblastic T-cell lymphoma, Newly diagnosed Imaging study findings were concerning for lymphoma.   Patient underwent lymph node biopsy which showed a lymphoproliferative process but could not offer definitive diagnosis. HIV negative.  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  Patient underwent excisional lymph node biopsy on 2/18.  Final pathology suggested angioimmunoblastic T-cell lymphoma.  Patient seen by oncology.  Patient was given reduced dose R CHOP on 2/22.  Monitor for tumor lysis syndrome.  Uric acid level did climb and peaked at 8.7.  7.4 today.  She is noted to be on allopurinol and dose was increased on 2/24.  Bone marrow biopsy was postponed per family request.  Echocardiogram showed normal systolic function.    Maculopapular rash Patient was noted to have a maculopapular rash to his bilateral arms and chest and back.  This has improved most likely due to steroids. Etiology unclear.  Moderate to severe protein calorie malnutrition/tube feeding Patient has lost weight.  Patient's son and daughter very concerned that patient has not been eating and drinking much.  Tube feeds were offered to the patient 2/19 but he declined.  This was again discussed with the patient on 2/20.  Feeding tube was inserted he was started on tube feedings.  Overnight on 2/21 the feeding tube got dislodged.  This was repositioned on 2/23.  Tube feedings were reinitiated.  She is appetite has improved.  Calorie count is in progress.  Feeding tube got clogged overnight.  Unclogging protocol initiated.  Dietitian is following.  If his oral intake is adequate then feeding tube could potentially be removed in the next day or so.    Lower extremity edema Lower extremity Doppler studies negative for DVT.  Swelling is most likely due to hypoalbuminemia.  Will give another dose of Lasix today.  Compression stockings.  Keep legs elevated.    Upper extremity swelling/superficial thrombosis No DVT was noted on upper extremity ultrasound.  Urinary tract  infection/hematuria Urine culture positive for Klebsiella. Completed course of antibiotics.  Abnormal thyroid function tests TSH was 6.45 earlier in this hospitalization. Repeated by oncology and noted to be 13 on 2/25.  Free T4 is 0.82.  Free T3 is 1.4 which is on the lower side.  However all these values can be abnormal under conditions of acute illness.  This was explained in detail to the patient and his family.  Patient  not exhibiting any signs of hypothyroidism.  I will continue to recommend repeating the studies in the next 4-6 weeks.  Will not initiate treatment for same.    Normocytic anemia Hemoglobin is stable.  Continue to monitor H96 and folic acid levels are not deficient.  Leukocytosis This was likely due to steroids.  WBC is now normal.  History of rheumatoid arthritis Holding Xelijanz.  Acute kidney injury on chronic kidney disease stage III Renal function is stable.  Continue to monitor urine output.  Essential hypertension Continue metoprolol.  Blood pressure is stable.  History of GERD Continue PPI  History of anxiety Continue Ativan  Deviated nasal septum Seen by ENT.  No acute issue for now.  Outpatient follow-up.  Flonase.  Ectatic infrarenal abdominal aorta Outpatient follow-up  DVT Prophylaxis: Subcutaneous heparin    Code Status: Full code Family Communication: Discussed with the patient and his children Disposition Plan: Management as outlined above.  PT and OT to continue to follow.    LOS: 12 days   [facility] Pager [phone] 03/12/2017, 8:58 AM  If 7PM-7AM, please contact night-coverage at [email or web address], password [facility]

## 2017-03-12 NOTE — Progress Notes (Signed)
Report received that tube has been clogged since 0400. This nurse has attempted to flush tube without success. On call MD notified per Amion.

## 2017-03-12 NOTE — Progress Notes (Signed)
D/Ced feeding tube Encouraged oral intake Patient and family on-board

## 2017-03-12 NOTE — Progress Notes (Signed)
CSW following to assist with disposition. Planning for SNF at DC after deliberating yesterday.  Discussed bed offers thus far with pt and his 3 children today. Daughter going to make visits today to narrow down options.  Again discussed insurance authorization process. Pt also has long term care policy that he hopes will be helpful if he is unable to go to SNF under [facility] benefit. Prepared to pay out of pocket on day of admission if neither payor source is option that day. Pt discussed his anxiety around his goal of adequate oral caloric intake prior to being able to have NG tube removed. CSW and children validated pt's concerns and provided opportunities for pt to reframe his outlook on this barrier. Pt reported feeling more confident. Will continue following to assist.   Sharren Bridge, MSW, LCSW Clinical Social Work 03/12/2017 [phone]

## 2017-03-12 NOTE — Progress Notes (Signed)
HEMATOLOGY-ONCOLOGY PROGRESS NOTE  SUBJECTIVE:Patient appears to be breathing better. WBC count higher due to Neupogen.  OBJECTIVE: REVIEW OF SYSTEMS:   Constitutional: Denies fevers, chills or abnormal weight loss Eyes: Denies blurriness of vision Ears, nose, mouth, throat, and face: Ng tube for feeds Respiratory: Denies cough, dyspnea or wheezes Cardiovascular: Denies palpitation, chest discomfort Gastrointestinal:  Denies nausea, heartburn or change in bowel habits Skin: Denies abnormal skin rashes Lymphatics: Denies new lymphadenopathy or easy bruising Neurological:Denies numbness, tingling or new weaknesses Behavioral/Psych: Mood is stable, no new changes  Extremities: No lower extremity edema All other systems were reviewed with the patient and are negative.  I have reviewed the past medical history, past surgical history, social history and family history with the patient and they are unchanged from previous note.   PHYSICAL EXAMINATION: ECOG PERFORMANCE STATUS: 2 - Symptomatic, <50% confined to bed  Vitals:   03/12/17 0411 03/12/17 1146  BP: 133/76 120/64  Pulse: 87 98  Resp: 20 18  Temp: 97.9 F (36.6 C) 97.9 F (36.6 C)  SpO2: 95% 93%   Filed Weights   03/07/17 0515 03/08/17 1459 03/10/17 2300  Weight: 215 lb 9.8 oz (97.8 kg) 212 lb 1.3 oz (96.2 kg) 212 lb 1.3 oz (96.2 kg)    GENERAL:alert, no distress and comfortable SKIN: skin color, texture, turgor are normal, no rashes or significant lesions EYES: normal, Conjunctiva are pink and non-injected, sclera clear OROPHARYNX:no exudate, no erythema and lips, buccal mucosa, and tongue normal  NECK: supple, thyroid normal size, non-tender, without nodularity LYMPH:  no palpable lymphadenopathy in the cervical, axillary or inguinal LUNGS: clear to auscultation and percussion with normal breathing effort HEART: regular rate & rhythm and no murmurs and no lower extremity edema ABDOMEN:abdomen soft, non-tender and  normal bowel sounds Musculoskeletal:no cyanosis of digits and no clubbing  NEURO: alert & oriented x 3 with fluent speech, no focal motor/sensory deficits  LABORATORY DATA:  I have reviewed the data as listed CMP Latest Ref Rng & Units 03/12/2017 03/11/2017 03/10/2017  Glucose 65 - 99 mg/dL 91 138(H) 98  BUN 6 - 20 mg/dL 40(H) 40(H) 42(H)  Creatinine 0.61 - 1.24 mg/dL 1.29(H) 1.37(H) 1.55(H)  Sodium 135 - 145 mmol/L 135 136 135  Potassium 3.5 - 5.1 mmol/L 4.1 4.2 4.1  Chloride 101 - 111 mmol/L 101 104 103  CO2 22 - 32 mmol/L 25 24 24   Calcium 8.9 - 10.3 mg/dL 8.2(L) 8.5(L) 8.4(L)  Total Protein 6.5 - 8.1 g/dL - 7.1 7.2  Total Bilirubin 0.3 - 1.2 mg/dL - 0.4 0.5  Alkaline Phos 38 - 126 U/L - 51 54  AST 15 - 41 U/L - 20 22  ALT 17 - 63 U/L - 21 21    Lab Results  Component Value Date   WBC 31.6 (H) 03/12/2017   HGB 9.3 (L) 03/12/2017   HCT 27.9 (L) 03/12/2017   MCV 100.0 03/12/2017   PLT 193 03/12/2017   NEUTROABS 7.8 (H) 02/28/2017    ASSESSMENT AND PLAN: 1. Angioimmunoblastic T cell Lymphoma: S/P Mini CHOP Some tumor lysis evidenced by inc in Uric acid but its now coming down 2. Leucocytosis: due to Granix. If it continues to rise, we may elect to hold it. 3. Anemia 4. Renal Insuff 5. Nutrition: Slowly increaseing diet

## 2017-03-12 NOTE — Progress Notes (Signed)
Pt's feeding tube is clogged-won't flush per pump or manually.  On call notified.

## 2017-03-12 NOTE — Progress Notes (Signed)
Physical Therapy Treatment Patient Details Name: Jose Meza MRN: [id number] DOB: 1931/06/11 Today's Date: 03/12/2017    History of Present Illness  84 y.o. male with medical history significant of chronic kidney disease, rheumatoid arthritis chronically on steroids, hypertension transferred here from [facility] after finding diffuse disease in his chest concerning for lymphoma.  S/p excisional lymph node biopsy on 2/18.  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 Pt is currently having diffuculty with nearly constant incontinence of stool especially with movment     PT Comments    Pt is up to walk a short trip with gait belt and talked with son about the safety of the belt as well as repositioning in the chair.  Pt is also tolerating some brief standing from [facility] with time to manage peri cleaning and skin barrier cream application.  Will continue on acutely for strengthening and family education for follow up care.  Plan is still to go home.   Follow Up Recommendations  Home health PT;Supervision/Assistance - 24 hour     Equipment Recommendations  3in1 (PT);Wheelchair (measurements PT);Hospital bed;Other (comment)    Recommendations for Other Services OT consult     Precautions / Restrictions Precautions Precautions: Fall Precaution Comments: denies falls in past 1 year Other Brace/Splint: nasal feeding tube(has regular food in his room now) Restrictions Weight Bearing Restrictions: No    Mobility  Bed Mobility Overal bed mobility: Needs Assistance             General bed mobility comments: up on side of bed when PT arrived  Transfers Overall transfer level: Needs assistance Equipment used: Rolling walker (2 wheeled) Transfers: Sit to/from Stand Sit to Stand: Min assist;From elevated surface         General transfer comment: able to step from bed to chair wiht RW and min assist to keep contact with  belt  Ambulation/Gait Ambulation/Gait assistance: Min assist Ambulation Distance (Feet): 3 Feet(x 2) Assistive device: Rolling walker (2 wheeled)       General Gait Details: sidesteps, short trip   Stairs            Wheelchair Mobility    Modified Rankin (Stroke Patients Only)       Balance Overall balance assessment: Needs assistance Sitting-balance support: Feet supported;Bilateral upper extremity supported Sitting balance-Leahy Scale: Fair     Standing balance support: Bilateral upper extremity supported Standing balance-Leahy Scale: Fair Standing balance comment: less than fair dynamically                            Cognition Arousal/Alertness: Awake/alert Behavior During Therapy: WFL for tasks assessed/performed Overall Cognitive Status: Within Functional Limits for tasks assessed                                 General Comments: son is present and supportive      Exercises      General Comments General comments (skin integrity, edema, etc.): LE and UE edema, repositioned on chair with pillows to reduce direct pressure on the peri area      Pertinent Vitals/Pain Pain Assessment: Faces Faces Pain Scale: Hurts little more Pain Location: bottom with peri care (use wet wipes from home) and with chair sitting Pain Descriptors / Indicators: Aching;Burning Pain Intervention(s): Limited activity within patient's tolerance;Monitored during session;Premedicated before session;Repositioned    Home Living  Prior Function            PT Goals (current goals can now be found in the care plan section) Acute Rehab PT Goals Patient Stated Goal: to go home  Progress towards PT goals: Progressing toward goals    Frequency    Min 3X/week      PT Plan Current plan remains appropriate    Co-evaluation              AM-PAC PT "6 Clicks" Daily Activity  Outcome Measure  Difficulty turning over  in bed (including adjusting bedclothes, sheets and blankets)?: A Little Difficulty moving from lying on back to sitting on the side of the bed? : A Little Difficulty sitting down on and standing up from a chair with arms (e.g., wheelchair, bedside commode, etc,.)?: A Little Help needed moving to and from a bed to chair (including a wheelchair)?: A Little Help needed walking in hospital room?: A Little Help needed climbing 3-5 steps with a railing? : Total 6 Click Score: 16    End of Session Equipment Utilized During Treatment: Gait belt Activity Tolerance: Patient limited by fatigue;Other (comment)(fear of having an incontinent episode) Patient left: in chair;with call bell/phone within reach;with family/visitor present Nurse Communication: Mobility status PT Visit Diagnosis: Unsteadiness on feet (R26.81);Other abnormalities of gait and mobility (R26.89);Muscle weakness (generalized) (M62.81);Difficulty in walking, not elsewhere classified (R26.2)     Time: 0930-1003 PT Time Calculation (min) (ACUTE ONLY): 33 min  Charges:  $Gait Training: 8-22 mins $Therapeutic Activity: 8-22 mins                    G Codes:  Functional Assessment Tool Used: AM-PAC 6 Clicks Basic Mobility     Ramond Dial 03/12/2017, 10:58 AM   Mee Hives, PT MS Acute Rehab Dept. Number: [place]

## 2017-03-12 NOTE — Progress Notes (Signed)
Calorie Count Note  48 hour calorie count ordered. Day 1 results below.  Diet: regular  Supplements:  -Magic cups BID -Protein shakes from family  2/25: Breakfast: 225 kcal,9g protein Lunch: 190 kcal, 24g protein Dinner: 375 kcal, 9g protein Supplements: none  Estimated Nutritional Needs:  Kcal:  2180-2360 (24-26 kcal/kg) Protein:  110-127 grams (1.2-1.4 grams/kg)  Total intake: 790 kcal (36% of minimum estimated needs)  42g protein (38% of minimum estimated needs)  Nutrition Dx:  Inadequate oral intake related to acute illness, decreased appetite as evidenced by per patient/family report.  Goal: Pt to meet >/= 90% of their estimated nutrition needs  Intervention:  -Continue Calorie Count -Continue supplements -Resume TF if able  Clayton Bibles, MS, RD, LDN [place] Dietitian Pager: [phone] After Hours Pager: [phone]

## 2017-03-13 DIAGNOSIS — C8440 Peripheral T-cell lymphoma, not classified, unspecified site: Secondary | ICD-10-CM

## 2017-03-13 LAB — CBC
HCT: 28 % — ABNORMAL LOW (ref 39.0–52.0)
HEMOGLOBIN: 9.4 g/dL — AB (ref 13.0–17.0)
MCH: 33.9 pg (ref 26.0–34.0)
MCHC: 33.6 g/dL (ref 30.0–36.0)
MCV: 101.1 fL — ABNORMAL HIGH (ref 78.0–100.0)
Platelets: 206 10*3/uL (ref 150–400)
RBC: 2.77 MIL/uL — ABNORMAL LOW (ref 4.22–5.81)
RDW: 16.3 % — AB (ref 11.5–15.5)
WBC: 16.5 10*3/uL — ABNORMAL HIGH (ref 4.0–10.5)

## 2017-03-13 LAB — COMPREHENSIVE METABOLIC PANEL
ALK PHOS: 52 U/L (ref 38–126)
ALT: 20 U/L (ref 17–63)
ANION GAP: 5 (ref 5–15)
AST: 20 U/L (ref 15–41)
Albumin: 2.2 g/dL — ABNORMAL LOW (ref 3.5–5.0)
BUN: 40 mg/dL — ABNORMAL HIGH (ref 6–20)
CALCIUM: 8.1 mg/dL — AB (ref 8.9–10.3)
CO2: 28 mmol/L (ref 22–32)
Chloride: 101 mmol/L (ref 101–111)
Creatinine, Ser: 1.25 mg/dL — ABNORMAL HIGH (ref 0.61–1.24)
GFR calc non Af Amer: 51 mL/min — ABNORMAL LOW (ref >60)
GFR, EST AFRICAN AMERICAN: 60 mL/min — AB (ref >60)
Glucose, Bld: 81 mg/dL (ref 65–99)
POTASSIUM: 4.3 mmol/L (ref 3.5–5.1)
SODIUM: 134 mmol/L — AB (ref 135–145)
TOTAL PROTEIN: 7.3 g/dL (ref 6.5–8.1)
Total Bilirubin: 0.5 mg/dL (ref 0.3–1.2)

## 2017-03-13 LAB — GLUCOSE, CAPILLARY
GLUCOSE-CAPILLARY: 88 mg/dL (ref 65–99)
GLUCOSE-CAPILLARY: 81 mg/dL (ref 65–99)
Glucose-Capillary: 94 mg/dL (ref 65–99)
Glucose-Capillary: 90 mg/dL (ref 65–99)
Glucose-Capillary: 117 mg/dL — ABNORMAL HIGH (ref 65–99)

## 2017-03-13 LAB — COPPER, SERUM: COPPER: 76 ug/dL (ref 72–166)

## 2017-03-13 LAB — URIC ACID: Uric Acid, Serum: 7 mg/dL (ref 4.4–7.6)

## 2017-03-13 MED ORDER — SODIUM CHLORIDE 0.9% FLUSH: 10.0000 mL | Freq: Two times a day (BID) | INTRAVENOUS | Status: DC

## 2017-03-13 MED ORDER — SODIUM CHLORIDE 0.9% FLUSH: 10.0000 mL | INTRAVENOUS | Status: DC | PRN

## 2017-03-13 NOTE — Progress Notes (Signed)
  Speech Language Pathology Treatment: Dysphagia  Patient Details Name: Jose Meza MRN: [id number] DOB: 1931-09-28 Today's Date: 03/13/2017 Time: 8309-4076 SLP Time Calculation (min) (ACUTE ONLY): 15 min  Assessment / Plan / Recommendation Clinical Impression  Pt seen at bedside to follow up after BSE. Pt reports he is swallowing well, with only occasional episodes of coughing or globus sensation. SLP provided behavioral and dietary strategies for management of esophageal dysmotility. No further ST intervention is recommended at this time. Please reconsult if needs arise.   HPI HPI: 84 y.o. male with medical history significant of chronic kidney disease, rheumatoid arthritis chronically on steroids, hypertension transferred here from [facility] after finding use disease in his chest concerning for lymphoma.  Patient arrived there with complaints of coughing for over a week with chills and low-grade fevers at home.  Is lost over 20 pounds in the last 4-6 weeks.  He is recently been treated for prostatitis with an unknown antibiotic a month ago.  His symptoms have persisted however despite recent steroid taper for bronchitis also.  He denies any nausea vomiting or diarrhea.  He has not noticed any swollen areas on his body lately.  He denies any rashes.  CTA was done which showed diffuse disease concerning for lymphoma with possible pneumonia.  Patient is referred for admission for hypoxia with 90% O2 sats on room air along with possible pneumonia.  He denies any pain.          Recommendations  Diet recommendations: Regular;Thin liquid Liquids provided via: Cup Medication Administration: Whole meds with puree Supervision: Patient able to self feed;Full supervision/cueing for compensatory strategies Compensations: Slow rate;Small sips/bites;Minimize environmental distractions Postural Changes and/or Swallow Maneuvers: Seated upright 90 degrees     [name] B. [name] [facility],  CCC-SLP Speech Language Pathologist [phone]  Shonna Chock 03/13/2017, 12:47 PM

## 2017-03-13 NOTE — Progress Notes (Signed)
PROGRESS NOTE    Jose Meza  QQP:[id number] DOB: 1931/12/17 DOA: 02/27/2017 PCP: Patient, No Pcp Per   Brief Narrative:  84 year old Caucasian male with Jose Meza past medical history of rheumatoid arthritis chronically on steroids, hypertension was hospitalized due to extensive lymphadenopathy detected on imaging studies.  Patient's symptoms included chills fever weight loss shortness of breath.  Concern was for lymphoma.  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  Patient was given chemotherapeutic agents on 2/22.  His NG tube has now been removed and he is on Jose Meza calorie count.    Assessment & Plan:   Principal Problem:   Angioimmunoblastic lymphoma ([name]) Active Problems:   Hyperlipidemia   Hypertension   CKD (chronic kidney disease) stage 3, GFR 30-59 ml/min (HCC)   Rheumatoid arthritis of multiple sites with negative rheumatoid factor (HCC)   Pulmonary nodules/lesions, multiple   Generalized lymphadenopathy   Unintentional weight loss   Fever   Atherosclerotic peripheral vascular disease (HCC)   Rash   Acute respiratory failure with hypoxia Initially thought to be due to community-acquired pneumonia.  But patient also noted to have widespread lymphadenopathy and pulmonary nodules.  These could have also contributed.  Patient was started on antibiotics.  Due to persistent fevers patient was seen by infectious disease.  Influenza PCR and respiratory viral panel was negative.  Antibiotics were discontinued.  Patient was started on steroids after biopsy was performed.   Patient's respiratory status continues to improve, currently on room air.   Angioimmunoblastic T-cell lymphoma, Newly diagnosed Imaging study findings were concerning for lymphoma.  Patient underwent lymph node biopsy which showed Jose Meza lymphoproliferative process but could not offer definitive diagnosis. HIV negative.  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   Patient underwent excisional lymph node biopsy on 2/18.  Final pathology suggested angioimmunoblastic T-cell lymphoma.  Patient seen by oncology.  Patient was given reduced dose R CHOP on 2/22.  Monitor for tumor lysis syndrome.  Uric acid level did climb and peaked at 8.7.  7.0 today.  She is noted to be on allopurinol and dose was increased on 2/24.  Bone marrow biopsy was postponed per family request.  Echocardiogram showed normal systolic function.    Maculopapular rash Patient was noted to have Jose Meza maculopapular rash to his bilateral arms and chest and back.  This has improved most likely due to steroids. Etiology unclear.  Moderate to severe protein calorie malnutrition/tube feeding Patient has lost weight.  Patient's son and daughter very concerned that patient has not been eating and drinking much.  Tube feeds were offered to the patient 2/19 but he declined.  This was again discussed with the patient on 2/20.  Feeding tube was inserted he was started on tube feedings.  Overnight on 2/21 the feeding tube got dislodged.  This was repositioned on 2/23.  Tube feedings were reinitiated.  NG tube removed last night by oncology.  This morning with improved appetite.  Continue calorie count.  Continue marinol.       Lower extremity edema Lower extremity Doppler studies negative for DVT.  Swelling is most likely due to hypoalbuminemia.  L>R mild edema today.  Hold additional lasix for now.  Compression stockings.  Keep legs elevated.    Upper extremity swelling/superficial thrombosis No DVT was noted on upper extremity ultrasound.  Urinary tract infection/hematuria Urine culture positive for Klebsiella. Completed course of antibiotics.  Abnormal thyroid function tests TSH was 6.45 earlier in this hospitalization. Repeated by oncology and  noted to be 13 on 2/25.  Free T4 is 0.82.  Free T3 is 1.4 which is on the lower side.  However all these values can be abnormal under conditions of acute  illness.  This was explained in detail to the patient and his family.  Patient not exhibiting any signs of hypothyroidism.  Recommend repeating the studies in the next 4-6 weeks.  Will not initiate treatment for same.    Normocytic anemia Hemoglobin is stable.  Continue to monitor Q67 and folic acid levels are not deficient.  Leukocytosis This was likely due to steroids/granix.  WBC is now normal.  History of rheumatoid arthritis Holding Xelijanz.  Acute kidney injury on chronic kidney disease stage III Renal function is stable.  Continue to monitor urine output.  Essential hypertension Continue metoprolol.  Blood pressure is stable.  History of GERD Continue PPI  History of anxiety Continue Ativan  Deviated nasal septum Seen by ENT.  No acute issue for now.  Outpatient follow-up.  Flonase.  Ectatic infrarenal abdominal aorta Outpatient follow-up   DVT prophylaxis: heparin Code Status: full code Family Communication: daugther at bedside Disposition Plan: pending good PO intake, possible SNF placement   Consultants:   Oncology  Surgery  IR  Procedures:  Echo 2/16 Study Conclusions  - Procedure narrative: Transthoracic echocardiography. Image   quality was suboptimal. The study was technically difficult, as Jose Meza   result of body habitus. - Left ventricle: The cavity size was normal. There was moderate   concentric hypertrophy. Systolic function was vigorous. The   estimated ejection fraction was in the range of 65% to 70%. - Aortic valve: Calcified with mild to moderate stenosis and   trivial regurgitation. Mean gradient (S): 13 mm Hg. Peak gradient   (S): 23 mm Hg. Valve area (VTI): 1.56 cm^2. Valve area (Vmax):   1.56 cm^2. Valve area (Vmean): 1.6 cm^2. - Aorta: Dilated aortic root. Aortic root dimension: 41 mm (ED). - Mitral valve: Mildly thickened leaflets . There was trivial   regurgitation. - Left atrium: The atrium was mildly dilated. -  Tricuspid valve: There was mild regurgitation. - Pulmonary arteries: PA peak pressure: 54 mm Hg (S). - Inferior vena cava: The vessel was dilated. The respirophasic   diameter changes were blunted (< 50%), consistent with elevated   central venous pressure. - Pericardium, extracardiac: Shayanna Thatch trivial pericardial effusion was   identified posterior to the heart.  Impressions:  - Technically difficult study. LVEF 65-70%, moderate LVH, grossly   normal wall motion, grade 1 DD, mild to moderate aortic stenosis   - trivial AI, estimated AVA around 1.5 cm2, dilated aortic root   to 4.1 cm, mild TR, RVSP 54 mmHg, dilated IVC, trivial posterior   pericardial effusion.  Antimicrobials:  Anti-infectives (From admission, onward)   Start     Dose/Rate Route Frequency Ordered Stop   03/05/17 1800  azithromycin (ZITHROMAX) tablet 500 mg     500 mg Oral Daily-1800 03/05/17 1057 03/05/17 1716   03/01/17 1300  ceFEPIme (MAXIPIME) 1 g in sodium chloride 0.9 % 100 mL IVPB  Status:  Discontinued     1 g 200 mL/hr over 30 Minutes Intravenous 2 times daily 03/01/17 1202 03/05/17 1732   02/28/17 1800  cefTRIAXone (ROCEPHIN) 1 g in sodium chloride 0.9 % 100 mL IVPB  Status:  Discontinued     1 g 200 mL/hr over 30 Minutes Intravenous Every 24 hours 02/27/17 2205 03/01/17 1151   02/28/17 1800  azithromycin (ZITHROMAX) 500 mg  in sodium chloride 0.9 % 250 mL IVPB  Status:  Discontinued     500 mg 250 mL/hr over 60 Minutes Intravenous Every 24 hours 02/27/17 2205 03/05/17 1057   02/27/17 1824  azithromycin (ZITHROMAX) 500 MG injection    Comments:  Peel, Adrienne   : cabinet override      02/27/17 1824 02/28/17 0629   02/27/17 1800  cefTRIAXone (ROCEPHIN) 1 g in sodium chloride 0.9 % 100 mL IVPB     1 g 200 mL/hr over 30 Minutes Intravenous  Once 02/27/17 1757 02/27/17 1905   02/27/17 1800  azithromycin (ZITHROMAX) 500 mg in sodium chloride 0.9 % 250 mL IVPB     500 mg 250 mL/hr over 60 Minutes Intravenous   Once 02/27/17 1757 02/27/17 2034     Subjective: Appetite this morning. Overall feeling better.  Liley Rake bit apprehensive about leaving hospital.   Objective: Vitals:   03/12/17 1146 03/12/17 2027 03/13/17 0448 03/13/17 1104  BP: 120/64 123/68 134/77 116/75  Pulse: 98 (!) 105 97 98  Resp: 18 20 20    Temp: 97.9 F (36.6 C) 98.1 F (36.7 C) 98.8 F (37.1 C)   TempSrc: Oral  Oral   SpO2: 93% 96% 96%   Weight:      Height:        Intake/Output Summary (Last 24 hours) at 03/13/2017 1317 Last data filed at 03/13/2017 1149 Gross per 24 hour  Intake 3 ml  Output 1550 ml  Net -1547 ml   Filed Weights   03/07/17 0515 03/08/17 1459 03/10/17 2300  Weight: 97.8 kg (215 lb 9.8 oz) 96.2 kg (212 lb 1.3 oz) 96.2 kg (212 lb 1.3 oz)    Examination:  General exam: Appears calm and comfortable  Respiratory system: Clear to auscultation. Respiratory effort normal. Cardiovascular system: S1 & S2 heard, RRR. No JVD, murmurs, rubs, gallops or clicks. No pedal edema. Gastrointestinal system: Abdomen is nondistended, soft and nontender. No organomegaly or masses felt. Normal bowel sounds heard. Central nervous system: Alert and oriented. No focal neurological deficits. Extremities: L>R mild LEE, upper extremities with improved edema Skin: No rashes, lesions or ulcers Psychiatry: Judgement and insight appear normal. Mood & affect appropriate.     Data Reviewed: I have personally reviewed following labs and imaging studies  CBC: Recent Labs  Lab 03/09/17 0500 03/10/17 0500 03/11/17 0500 03/12/17 0542 03/13/17 0505  WBC 12.5* 8.1 6.5 31.6* 16.5*  HGB 10.1* 9.7* 9.3* 9.3* 9.4*  HCT 31.2* 29.7* 27.9* 27.9* 28.0*  MCV 101.0* 100.7* 97.6 100.0 101.1*  PLT 181 173 156 193 409   Basic Metabolic Panel: Recent Labs  Lab 03/09/17 0500 03/10/17 0500 03/11/17 0500 03/12/17 0542 03/13/17 0505  NA 137 135 136 135 134*  K 4.6 4.1 4.2 4.1 4.3  CL 105 103 104 101 101  CO2 23 24 24 25 28     GLUCOSE 92 98 138* 91 81  BUN 40* 42* 40* 40* 40*  CREATININE 1.57* 1.55* 1.37* 1.29* 1.25*  CALCIUM 8.4* 8.4* 8.5* 8.2* 8.1*  MG  --  2.3  --   --   --    GFR: Estimated Creatinine Clearance: 56.4 mL/min (Dessa Ledee) (by C-G formula based on SCr of 1.25 mg/dL (H)). Liver Function Tests: Recent Labs  Lab 03/09/17 0500 03/10/17 0500 03/11/17 0500 03/13/17 0505  AST 19 22 20 20   ALT 22 21 21 20   ALKPHOS 54 54 51 52  BILITOT 0.4 0.5 0.4 0.5  PROT 6.9 7.2 7.1 7.3  ALBUMIN 1.9* 2.1* 2.0* 2.2*   No results for input(s): LIPASE, AMYLASE in the last 168 hours. No results for input(s): AMMONIA in the last 168 hours. Coagulation Profile: No results for input(s): INR, PROTIME in the last 168 hours. Cardiac Enzymes: No results for input(s): CKTOTAL, CKMB, CKMBINDEX, TROPONINI in the last 168 hours. BNP (last 3 results) No results for input(s): PROBNP in the last 8760 hours. HbA1C: No results for input(s): HGBA1C in the last 72 hours. CBG: Recent Labs  Lab 03/12/17 2021 03/13/17 0117 03/13/17 0446 03/13/17 0753 03/13/17 1139  GLUCAP 135* 94 90 88 81   Lipid Profile: No results for input(s): CHOL, HDL, LDLCALC, TRIG, CHOLHDL, LDLDIRECT in the last 72 hours. Thyroid Function Tests: Recent Labs    03/11/17 0500  TSH 13.518*  FREET4 0.82  T3FREE 1.4*   Anemia Panel: Recent Labs    03/11/17 0500  VITAMINB12 1,292*  FOLATE 14.7   Sepsis Labs: No results for input(s): PROCALCITON, LATICACIDVEN in the last 168 hours.  No results found for this or any previous visit (from the past 240 hour(s)).       Radiology Studies: No results found.      Scheduled Meds: . allopurinol  100 mg Oral Daily  . dronabinol  5 mg Oral BID AC  . feeding supplement (ENSURE ENLIVE)  237 mL Oral BID BM  . feeding supplement (PRO-STAT SUGAR FREE 64)  30 mL Per Tube Daily  . finasteride  5 mg Oral Daily  . fluticasone  1 spray Each Nare Daily  . furosemide  20 mg Oral Once  . heparin  injection (subcutaneous)  5,000 Units Subcutaneous Q8H  . liver oil-zinc oxide   Topical BID  . LORazepam  1 mg Oral QHS  . metoprolol tartrate  75 mg Oral BID  . multivitamin with minerals  1 tablet Oral Daily  . pantoprazole  40 mg Oral Daily  . sodium chloride flush  3 mL Intravenous Q12H  . tamsulosin  0.4 mg Oral Daily   Continuous Infusions: . sodium chloride    . ondansetron (ZOFRAN) IVPB       LOS: 13 days    Time spent: over 30 min    Fayrene Helper, MD [facility] Pager [phone]  If 7PM-7AM, please contact night-coverage [email or web address] Password [email or web address] 03/13/2017, 1:17 PM

## 2017-03-13 NOTE — Progress Notes (Signed)
Calorie Count Note  48 hour calorie count ordered. Day 2 results below.  Diet: regular  Supplements:  -Magic cups BID -Protein shakes from family  2/26: Breakfast: 460 kcal,21g protein Lunch: 600 kcal, 18g protein Dinner: 130 kcal, 2g protein Supplements: 230 kcal, 13g protein  Estimated Nutritional Needs: QJJH:4174-0814 (24-26 kcal/kg) Protein:110-127 grams (1.2-1.4 grams/kg)  Total intake:  1420 kcal (65% of minimum estimated needs)  54g protein (49% of minimum estimated needs)  Nutrition Dx:  Inadequate oral intakerelated to acute illness, decreased appetiteas evidenced by per patient/family report.  Goal: Pt to meet >/= 90% of their estimated nutrition needs  Intervention:  -D/c Calorie Count -Continue supplements -Encouragement of PO intakes, recommend small, frequent meals  Clayton Bibles, MS, RD, LDN [place] Dietitian Pager: [phone] After Hours Pager: [phone]

## 2017-03-14 DIAGNOSIS — J189 Pneumonia, unspecified organism: Secondary | ICD-10-CM | POA: Diagnosis not present

## 2017-03-14 DIAGNOSIS — R2689 Other abnormalities of gait and mobility: Secondary | ICD-10-CM | POA: Diagnosis not present

## 2017-03-14 DIAGNOSIS — K626 Ulcer of anus and rectum: Secondary | ICD-10-CM | POA: Diagnosis not present

## 2017-03-14 DIAGNOSIS — R159 Full incontinence of feces: Secondary | ICD-10-CM | POA: Diagnosis not present

## 2017-03-14 DIAGNOSIS — I959 Hypotension, unspecified: Secondary | ICD-10-CM | POA: Diagnosis not present

## 2017-03-14 DIAGNOSIS — R69 Illness, unspecified: Secondary | ICD-10-CM | POA: Diagnosis not present

## 2017-03-14 DIAGNOSIS — M6281 Muscle weakness (generalized): Secondary | ICD-10-CM | POA: Diagnosis not present

## 2017-03-14 DIAGNOSIS — C8590 Non-Hodgkin lymphoma, unspecified, unspecified site: Secondary | ICD-10-CM | POA: Diagnosis not present

## 2017-03-14 DIAGNOSIS — C865 Angioimmunoblastic T-cell lymphoma: Secondary | ICD-10-CM | POA: Diagnosis not present

## 2017-03-14 DIAGNOSIS — I10 Essential (primary) hypertension: Secondary | ICD-10-CM | POA: Diagnosis not present

## 2017-03-14 DIAGNOSIS — R5381 Other malaise: Secondary | ICD-10-CM | POA: Diagnosis not present

## 2017-03-14 DIAGNOSIS — Z9221 Personal history of antineoplastic chemotherapy: Secondary | ICD-10-CM | POA: Diagnosis not present

## 2017-03-14 DIAGNOSIS — R498 Other voice and resonance disorders: Secondary | ICD-10-CM | POA: Diagnosis not present

## 2017-03-14 DIAGNOSIS — C8440 Peripheral T-cell lymphoma, not classified, unspecified site: Secondary | ICD-10-CM | POA: Diagnosis not present

## 2017-03-14 DIAGNOSIS — D649 Anemia, unspecified: Secondary | ICD-10-CM | POA: Diagnosis not present

## 2017-03-14 DIAGNOSIS — R21 Rash and other nonspecific skin eruption: Secondary | ICD-10-CM | POA: Diagnosis not present

## 2017-03-14 DIAGNOSIS — E860 Dehydration: Secondary | ICD-10-CM | POA: Diagnosis not present

## 2017-03-14 DIAGNOSIS — C8250 Diffuse follicle center lymphoma, unspecified site: Secondary | ICD-10-CM | POA: Diagnosis not present

## 2017-03-14 DIAGNOSIS — M069 Rheumatoid arthritis, unspecified: Secondary | ICD-10-CM | POA: Diagnosis not present

## 2017-03-14 DIAGNOSIS — B372 Candidiasis of skin and nail: Secondary | ICD-10-CM | POA: Diagnosis not present

## 2017-03-14 DIAGNOSIS — R509 Fever, unspecified: Secondary | ICD-10-CM | POA: Diagnosis not present

## 2017-03-14 DIAGNOSIS — R278 Other lack of coordination: Secondary | ICD-10-CM | POA: Diagnosis not present

## 2017-03-14 DIAGNOSIS — R000 Tachycardia, unspecified: Secondary | ICD-10-CM | POA: Diagnosis not present

## 2017-03-14 LAB — CBC
HEMATOCRIT: 28.6 % — AB (ref 39.0–52.0)
HEMOGLOBIN: 9.5 g/dL — AB (ref 13.0–17.0)
MCH: 33.5 pg (ref 26.0–34.0)
MCHC: 33.2 g/dL (ref 30.0–36.0)
MCV: 100.7 fL — AB (ref 78.0–100.0)
Platelets: 216 10*3/uL (ref 150–400)
RBC: 2.84 MIL/uL — ABNORMAL LOW (ref 4.22–5.81)
RDW: 16.1 % — AB (ref 11.5–15.5)
WBC: 9.5 10*3/uL (ref 4.0–10.5)

## 2017-03-14 LAB — COMPREHENSIVE METABOLIC PANEL
ALBUMIN: 2.2 g/dL — AB (ref 3.5–5.0)
ALK PHOS: 52 U/L (ref 38–126)
ALT: 20 U/L (ref 17–63)
AST: 17 U/L (ref 15–41)
Anion gap: 5 (ref 5–15)
BILIRUBIN TOTAL: 0.7 mg/dL (ref 0.3–1.2)
BUN: 35 mg/dL — AB (ref 6–20)
CALCIUM: 8.2 mg/dL — AB (ref 8.9–10.3)
CO2: 27 mmol/L (ref 22–32)
Chloride: 103 mmol/L (ref 101–111)
Creatinine, Ser: 1.34 mg/dL — ABNORMAL HIGH (ref 0.61–1.24)
GFR calc Af Amer: 55 mL/min — ABNORMAL LOW (ref >60)
GFR, EST NON AFRICAN AMERICAN: 47 mL/min — AB (ref >60)
GLUCOSE: 94 mg/dL (ref 65–99)
Potassium: 4.5 mmol/L (ref 3.5–5.1)
Sodium: 135 mmol/L (ref 135–145)
TOTAL PROTEIN: 7.3 g/dL (ref 6.5–8.1)

## 2017-03-14 LAB — GLUCOSE, CAPILLARY: Glucose-Capillary: 87 mg/dL (ref 65–99)

## 2017-03-14 LAB — URIC ACID: URIC ACID, SERUM: 6.1 mg/dL (ref 4.4–7.6)

## 2017-03-14 MED ORDER — METOPROLOL TARTRATE 75 MG PO TABS: 75.0000 mg | ORAL_TABLET | Freq: Two times a day (BID) | ORAL | 0 refills | Status: AC

## 2017-03-14 MED ORDER — ALLOPURINOL 100 MG PO TABS: 100.0000 mg | ORAL_TABLET | Freq: Every day | ORAL | 0 refills | Status: AC

## 2017-03-14 MED ORDER — DRONABINOL 5 MG PO CAPS: 5.0000 mg | ORAL_CAPSULE | Freq: Two times a day (BID) | ORAL | 0 refills | Status: AC

## 2017-03-14 MED ORDER — LORAZEPAM 0.5 MG PO TABS: 1.0000 mg | ORAL_TABLET | Freq: Two times a day (BID) | ORAL | 0 refills | Status: AC | PRN

## 2017-03-14 MED ORDER — SALINE SPRAY 0.65 % NA SOLN: 1.0000 | NASAL | 0 refills | Status: AC | PRN

## 2017-03-14 MED ORDER — ENSURE ENLIVE PO LIQD: 237.0000 mL | Freq: Two times a day (BID) | ORAL | 12 refills | Status: AC

## 2017-03-14 NOTE — Progress Notes (Signed)
Physical Therapy Treatment Patient Details Name: Jose Meza MRN: [id number] DOB: 03-Jan-1932 Today's Date: 03/14/2017    History of Present Illness  84 y.o. male with medical history significant of chronic kidney disease, rheumatoid arthritis chronically on steroids, hypertension transferred here from [facility] after finding diffuse disease in his chest concerning for lymphoma.  S/p excisional lymph node biopsy on 2/18.  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 Pt is currently having diffuculty with nearly constant incontinence of stool especially with movment     PT Comments    Pt significantly limited by fatigue, unfortunately spending PT sessions going to and from [facility]; pt is too fatigue after this to attempt amb or even further ex's; strongly agree with plan for SNF ; will continue to follow in acute setting  Follow Up Recommendations  SNF;Supervision/Assistance - 24 hour     Equipment Recommendations  None recommended by PT    Recommendations for Other Services       Precautions / Restrictions Precautions Precautions: Fall Restrictions Weight Bearing Restrictions: No    Mobility  Bed Mobility Overal bed mobility: Needs Assistance Bed Mobility: Supine to Sit     Supine to sit: Supervision;HOB elevated     General bed mobility comments: supervision for safety, pt requires excessie amount of time to come to EOB, HOB at ~ 60*  Transfers Overall transfer level: Needs assistance Equipment used: Rolling walker (2 wheeled) Transfers: Sit to/from [facility] Sit to Stand: Min assist Stand pivot transfers: Min assist       General transfer comment: light assist to rise and transition to RW, pt self cues for hand placement and LE position; requires incr time and is extremely fatigued after each transition (bed to [facility] to chair) pt particpates in peri-care only with strong encouragement from PT, he is unabelt o complete  sitting peri-care d/t fatigue   Ambulation/Gait             General Gait Details: unable to amb d/t fatigue   Stairs            Wheelchair Mobility    Modified Rankin (Stroke Patients Only)       Balance   Sitting-balance support: Feet supported;Bilateral upper extremity supported;No upper extremity supported Sitting balance-Leahy Scale: Fair Sitting balance - Comments: fair to good, able to wt shift to perform peri-care   Standing balance support: Bilateral upper extremity supported Standing balance-Leahy Scale: Poor Standing balance comment: pt is reliant on UE support for balance                            Cognition Arousal/Alertness: Awake/alert Behavior During Therapy: WFL for tasks assessed/performed Overall Cognitive Status: Within Functional Limits for tasks assessed                                 General Comments: daughter present for part of session, encouraging      Exercises General Exercises - Lower Extremity Ankle Circles/Pumps: AROM;AAROM;Both;10 reps;Limitations Ankle Circles/Pumps Limitations: limited pf bil, L>R Quad Sets: AROM;Both;5 reps;Limitations Quad Sets Limitations: fatigues rapidly, unable to continue ex's d/t fatigue    General Comments        Pertinent Vitals/Pain Pain Assessment: Faces Faces Pain Scale: Hurts even more Pain Location: bottom with peri care (use wet wipes from home) and with chair sitting Pain Descriptors / Indicators: Discomfort;Grimacing Pain Intervention(s):  Monitored during session;Repositioned    Home Living                      Prior Function            PT Goals (current goals can now be found in the care plan section) Acute Rehab PT Goals Patient Stated Goal: to go home  PT Goal Formulation: With patient Time For Goal Achievement: 03/21/17 Potential to Achieve Goals: Fair Progress towards PT goals: Progressing toward goals(very slowly)     Frequency    Min 2X/week      PT Plan Discharge plan needs to be updated;Frequency needs to be updated    Co-evaluation              AM-PAC PT "6 Clicks" Daily Activity  Outcome Measure  Difficulty turning over in bed (including adjusting bedclothes, sheets and blankets)?: A Little Difficulty moving from lying on back to sitting on the side of the bed? : A Lot Difficulty sitting down on and standing up from a chair with arms (e.g., wheelchair, bedside commode, etc,.)?: Unable Help needed moving to and from a bed to chair (including a wheelchair)?: A Little Help needed walking in hospital room?: Total Help needed climbing 3-5 steps with a railing? : Total 6 Click Score: 11    End of Session Equipment Utilized During Treatment: Gait belt Activity Tolerance: Patient limited by fatigue Patient left: in chair;with call bell/phone within reach;with family/visitor present   PT Visit Diagnosis: Unsteadiness on feet (R26.81);Other abnormalities of gait and mobility (R26.89);Muscle weakness (generalized) (M62.81);Difficulty in walking, not elsewhere classified (R26.2)     Time: 4765-4650 PT Time Calculation (min) (ACUTE ONLY): 35 min  Charges:  $Therapeutic Activity: 23-37 mins                    G CodesKenyon Ana, PT Pager: [phone] 03/14/2017    [facility] 03/14/2017, 10:29 AM

## 2017-03-14 NOTE — NC FL2 (Signed)
[place] LEVEL OF CARE SCREENING TOOL     IDENTIFICATION  Patient Name: Jose Meza Birthdate: [birth year] Sex: male Admission Date (Current Location): 02/27/2017  [facility] and [place] Number:  Herbalist and Address:  [facility],  [place], [place]      Provider Number: [id number]  Attending Physician Name and Address:  Elodia Florence., *  Relative Name and Phone Number:       Current Level of Care: Hospital Recommended Level of Care: [place] Prior Approval Number:    Date Approved/Denied:   PASRR Number: [id number]  Discharge Plan: SNF    Current Diagnoses: Patient Active Problem List   Diagnosis Date Noted  . Angioimmunoblastic lymphoma ([place]) 03/06/2017  . Unintentional weight loss 03/05/2017  . Fever 03/05/2017  . Atherosclerotic peripheral vascular disease ([place]) 03/05/2017  . Rash 03/05/2017  . Generalized lymphadenopathy   . Pulmonary nodules/lesions, multiple 02/28/2017  . Rheumatoid arthritis of multiple sites with negative rheumatoid factor ([place]) 01/30/2016  . High risk medication use 01/30/2016  . History of total knee replacement, right 01/30/2016  . Primary osteoarthritis of both hands 01/30/2016  . Primary osteoarthritis of both feet 01/30/2016  . DDD C-spine 01/30/2016  . Osteoarthritis of lumbar spine 01/30/2016  . History of IBS 01/30/2016  . Hyperlipidemia   . DJD (degenerative joint disease) of knee   . Allergic rhinitis, seasonal   . BPH (benign prostatic hyperplasia)   . Hypertension   . Arthritis   . GERD (gastroesophageal reflux disease)   . CKD (chronic kidney disease) stage 3, GFR 30-59 ml/min (HCC)   . Polyp of colon, adenomatous 07/15/2009    Orientation RESPIRATION BLADDER Height & Weight     Self, Time, Situation, Place  Normal Continent Weight: 188 lb 7.9 oz (85.5 kg)(patient refused a standing scale stated he would wait for PT) Height:  6\' 5"   (195.6 cm)  BEHAVIORAL SYMPTOMS/MOOD NEUROLOGICAL BOWEL NUTRITION STATUS      Incontinent Diet(low sodium, heart healthy)  AMBULATORY STATUS COMMUNICATION OF NEEDS Skin   Limited Assist Verbally Surgical wounds  closed incision groin left Liquid skin adhesive                     Personal Care Assistance Level of Assistance  Bathing, Feeding, Dressing Bathing Assistance: Limited assistance Feeding assistance: Independent Dressing Assistance: Limited assistance     Functional Limitations Info  Sight, Hearing, Speech Sight Info: Adequate Hearing Info: Adequate Speech Info: Adequate    SPECIAL CARE FACTORS FREQUENCY  PT (By licensed PT), OT (By licensed OT)     PT Frequency: 5x OT Frequency: 5x            Contractures Contractures Info: Not present    Additional Factors Info  Code Status, Allergies Code Status Info: DNR Allergies Info: sulfa drugs           Current Medications (03/14/2017):  This is the current hospital active medication list Current Facility-Administered Medications  Medication Dose Route Frequency Provider Last Rate Last Dose  . 0.9 %  sodium chloride infusion  250 mL Intravenous PRN Phillips Grout, MD      . acetaminophen (TYLENOL) tablet 650 mg  650 mg Oral Q6H PRN Elodia Florence., MD   650 mg at 03/11/17 0223  . albuterol (PROVENTIL) (2.5 MG/3ML) 0.083% nebulizer solution 2.5 mg  2.5 mg Nebulization Q4H PRN Elodia Florence., MD   2.5  mg at 03/05/17 0554  . allopurinol (ZYLOPRIM) tablet 100 mg  100 mg Oral Daily Magrinat, Virgie Dad, MD   100 mg at 03/14/17 1052  . alteplase (CATHFLO ACTIVASE) injection 2 mg  2 mg Intracatheter Once PRN Nicholas Lose, MD      . alum & mag hydroxide-simeth (MAALOX/MYLANTA) 200-200-20 MG/5ML suspension 30 mL  30 mL Oral Q6H PRN Elodia Florence., MD   30 mL at 03/01/17 0004  . benzonatate (TESSALON) capsule 200 mg  200 mg Oral BID PRN Blount, Scarlette Shorts T, NP      . diphenhydrAMINE-zinc acetate  (BENADRYL) 2-0.1 % cream   Topical BID PRN Magrinat, Virgie Dad, MD   1 application at 10/93/23 2150  . dronabinol (MARINOL) capsule 5 mg  5 mg Oral BID AC Elodia Florence., MD   5 mg at 03/14/17 1100  . feeding supplement (ENSURE ENLIVE) (ENSURE ENLIVE) liquid 237 mL  237 mL Oral BID BM Bonnielee Haff, MD   237 mL at 03/13/17 1114  . feeding supplement (PRO-STAT SUGAR FREE 64) liquid 30 mL  30 mL Per Tube Daily Bonnielee Haff, MD   30 mL at 03/07/17 1100  . finasteride (PROSCAR) tablet 5 mg  5 mg Oral Daily Elodia Florence., MD   5 mg at 03/14/17 1039  . fluticasone (FLONASE) 50 MCG/ACT nasal spray 1 spray  1 spray Each Nare Daily Elodia Florence., MD   1 spray at 03/14/17 1043  . furosemide (LASIX) tablet 20 mg  20 mg Oral Once Bonnielee Haff, MD      . guaiFENesin [facility]) 12 hr tablet 1,200 mg  1,200 mg Oral BID PRN Elodia Florence., MD   1,200 mg at 03/07/17 1841  . heparin injection 5,000 Units  5,000 Units Subcutaneous Q8H Blount, Xenia T, NP   5,000 Units at 03/14/17 5573  . heparin lock flush 100 unit/mL  500 Units Intracatheter Once PRN Nicholas Lose, MD      . heparin lock flush 100 unit/mL  250 Units Intracatheter Once PRN Nicholas Lose, MD      . ibuprofen (ADVIL,MOTRIN) tablet 400 mg  400 mg Oral Q6H PRN Elodia Florence., MD   400 mg at 03/13/17 2154  . iopamidol (ISOVUE-300) 61 % injection 50 mL  50 mL Oral Once PRN Sandi Mariscal, MD      . liver oil-zinc oxide (DESITIN) 40 % ointment   Topical BID Magrinat, Virgie Dad, MD   1 application at 22/02/54 1100  . loperamide (IMODIUM) capsule 2 mg  2 mg Oral TID PRN Bonnielee Haff, MD   2 mg at 03/11/17 1021  . LORazepam (ATIVAN) tablet 1 mg  1 mg Oral BID PRN Elodia Florence., MD   1 mg at 03/07/17 [phone]  . LORazepam (ATIVAN) tablet 1 mg  1 mg Oral QHS Bonnielee Haff, MD   1 mg at 03/13/17 2154  . metoprolol tartrate (LOPRESSOR) tablet 75 mg  75 mg Oral BID Lovey Newcomer T, NP   75 mg at 03/14/17 1038  .  multivitamin with minerals tablet 1 tablet  1 tablet Oral Daily Elodia Florence., MD   1 tablet at 03/13/17 1058  . ondansetron (ZOFRAN) 8 mg in sodium chloride 0.9 % 50 mL IVPB  8 mg Intravenous Q8H PRN Nicholas Lose, MD      . pantoprazole (PROTONIX) EC tablet 40 mg  40 mg Oral Daily Elodia Florence., MD  40 mg at 03/14/17 1039  . sodium chloride (OCEAN) 0.65 % nasal spray 1 spray  1 spray Each Nare PRN Elodia Florence., MD      . sodium chloride flush (NS) 0.9 % injection 10 mL  10 mL Intracatheter PRN Nicholas Lose, MD   10 mL at 03/09/17 2205  . sodium chloride flush (NS) 0.9 % injection 10-40 mL  10-40 mL Intracatheter Q12H Elodia Florence., MD      . sodium chloride flush (NS) 0.9 % injection 10-40 mL  10-40 mL Intracatheter PRN Elodia Florence., MD      . sodium chloride flush (NS) 0.9 % injection 3 mL  3 mL Intravenous Q12H Derrill Kay A, MD   3 mL at 03/13/17 1100  . sodium chloride flush (NS) 0.9 % injection 3 mL  3 mL Intravenous PRN Derrill Kay A, MD      . sodium chloride flush (NS) 0.9 % injection 3 mL  3 mL Intracatheter PRN Nicholas Lose, MD      . tamsulosin (FLOMAX) capsule 0.4 mg  0.4 mg Oral Daily Elodia Florence., MD   0.4 mg at 03/14/17 1039     Discharge Medications: Please see discharge summary for a list of discharge medications.  Relevant Imaging Results:  Relevant Lab Results:   Additional Information SS# [id number]  Nila Nephew, LCSW

## 2017-03-14 NOTE — Clinical Social Work Placement (Addendum)
Pt discharged with plan to admit to [facility] [phone]. Room #1206-P  Arranged PT[facility] transportation.  DC information sent via the [place] [employer] has not responded to insurance authorization request as of yet per facility. Pt agreed to paying privately for SNF care- hoping that [facility] will authorize in coming days. Also has looked into his long term care policy and is hoping that this payment option may come into play if pt meets requirements.  Daughter Verdis Frederickson at bedside aware and agreeable to plan. Pt/daughter thanks hospital staff for care of pt.   See below for placement details    CLINICAL SOCIAL WORK PLACEMENT  NOTE  Date:  03/14/2017  Patient Details  Name: Jose Meza MRN: [id number] Date of Birth: May 28, 1931  Clinical Social Work is seeking post-discharge placement for this patient at the [place] level of care (*CSW will initial, date and re-position this form in  chart as items are completed):  Yes   Patient/family provided with [place] Work Department's list of facilities offering this level of care within the geographic area requested by the patient (or if unable, by the patient's family).  Yes   Patient/family informed of their freedom to choose among providers that offer the needed level of care, that participate in Medicare, Medicaid or managed care program needed by the patient, have an available bed and are willing to accept the patient.  Yes   Patient/family informed of [place]'s ownership interest in [facility] and [facility], as well as of the fact that they are under no obligation to receive care at these facilities.  PASRR submitted to EDS on 03/14/17     PASRR number received on 03/14/17     Existing PASRR number confirmed on       FL2 transmitted to all facilities in geographic area requested by pt/family on 03/14/17     FL2 transmitted to all facilities within larger geographic area on        Patient informed that his/her managed care company has contracts with or will negotiate with certain facilities, including the following:        Yes   Patient/family informed of bed offers received.  Patient chooses bed at [facility]     Physician recommends and patient chooses bed at [facility]    Patient to be transferred to [facility] on 03/14/17.  Patient to be transferred to facility by PTAR     Patient family notified on 03/14/17 of transfer.  Name of family member notified:  daughter Verdis Frederickson     PHYSICIAN       Additional Comment:    _______________________________________________ Nila Nephew, LCSW 03/14/2017, 4:13 PM

## 2017-03-14 NOTE — Progress Notes (Addendum)
HEMATOLOGY-ONCOLOGY PROGRESS NOTE  SUBJECTIVE: Patient seen sitting comfortably in bedside chair this AM in no acute distress. Patient states that he continues to feel overall improved from previous days. Patient denies difficulties breathing and cough. Complains of dry mouth, which is occasionally improved with increase water intake. Daughter who is bedside has many questions regarding acute rehabilitation and outpatient plan for treatment of lymphoma. All questions were answered to her satisfaction.  OBJECTIVE: REVIEW OF SYSTEMS:   Constitutional: Denies fevers, chills or abnormal weight loss Eyes: Denies blurriness of vision Ears, nose, mouth, throat, and face: Denies mucositis or sore throat Respiratory: Denies cough, dyspnea or wheezes Cardiovascular: Denies palpitation, chest discomfort Gastrointestinal:  Denies nausea, heartburn or change in bowel habits Skin: Denies abnormal skin rashes Lymphatics: Denies new lymphadenopathy or easy bruising Neurological:Denies numbness, tingling or new weaknesses Behavioral/Psych: Mood is stable, no new changes  Extremities: No lower extremity edema  All other systems were reviewed with the patient and are negative.  I have reviewed the past medical history, past surgical history, social history and family history with the patient and they are unchanged from previous note.  PHYSICAL EXAMINATION: ECOG PERFORMANCE STATUS: 2 - Symptomatic, <50% confined to bed  Vitals:   03/14/17 0337 03/14/17 1410  BP: 123/74 132/66  Pulse: 81 82  Resp: 16 18  Temp: (!) 97.5 F (36.4 C) 99.1 F (37.3 C)  SpO2: 97% 96%   Filed Weights   03/08/17 1459 03/10/17 2300 03/14/17 0337  Weight: 212 lb 1.3 oz (96.2 kg) 212 lb 1.3 oz (96.2 kg) 188 lb 7.9 oz (85.5 kg)   GENERAL:alert, no distress and comfortable SKIN: skin color, texture, turgor are normal, no rashes or significant lesions EYES: normal, Conjunctiva are pink and non-injected, sclera  clear OROPHARYNX:no exudate, no erythema and lips, buccal mucosa, and tongue normal  NECK: supple, thyroid normal size, non-tender, without nodularity LYMPH:  no palpable lymphadenopathy in the cervical or axilla LUNGS: speaking comfortably in full sentences, no nasal flaring or accessory muscle use, lungs clear to auscultation bilaterally HEART: regular rate & rhythm and no murmurs, trace-1+ bilateral pitting edema around ankles ABDOMEN:abdomen soft, non-tender and normal bowel sounds Musculoskeletal:no cyanosis of digits and no clubbing  NEURO: alert & oriented x 3 with fluent speech, no focal motor/sensory deficits  LABORATORY DATA:  I have reviewed the data as listed CMP Latest Ref Rng & Units 03/14/2017 03/13/2017 03/12/2017  Glucose 65 - 99 mg/dL 94 81 91  BUN 6 - 20 mg/dL 35(H) 40(H) 40(H)  Creatinine 0.61 - 1.24 mg/dL 1.34(H) 1.25(H) 1.29(H)  Sodium 135 - 145 mmol/L 135 134(L) 135  Potassium 3.5 - 5.1 mmol/L 4.5 4.3 4.1  Chloride 101 - 111 mmol/L 103 101 101  CO2 22 - 32 mmol/L 27 28 25   Calcium 8.9 - 10.3 mg/dL 8.2(L) 8.1(L) 8.2(L)  Total Protein 6.5 - 8.1 g/dL 7.3 7.3 -  Total Bilirubin 0.3 - 1.2 mg/dL 0.7 0.5 -  Alkaline Phos 38 - 126 U/L 52 52 -  AST 15 - 41 U/L 17 20 -  ALT 17 - 63 U/L 20 20 -    Lab Results  Component Value Date   WBC 9.5 03/14/2017   HGB 9.5 (L) 03/14/2017   HCT 28.6 (L) 03/14/2017   MCV 100.7 (H) 03/14/2017   PLT 216 03/14/2017   NEUTROABS 7.8 (H) 02/28/2017    ASSESSMENT AND PLAN:  Angioummunoblatic T cell Lymphoma: Uric acid continuing to downtrend from 7.0 to 6.1 today. Patient feeling much better  since removal of NG tube yesterday, breathing ok. Patient stable for d/c to rehab facility with close oncology follow up for further chemotherapy as outpatient. Follow up in Dr. Geralyn Flash office will be arranged.   Attending Note  I personally saw the patient, reviewed the chart and examined the patient. The plan of care was discussed with the  patient and the admitting team. I agree with the assessment and plan as documented above. Thank you very much for the consultation. Angioimmunoblastic T-cell lymphoma: Next cycle of chemotherapy will be arranged as an outpatient. Patient is heading to rehab and is motivated to get better. His feeding has improved.

## 2017-03-14 NOTE — Discharge Summary (Signed)
Physician Discharge Summary  Jose Meza VZD:[id number] DOB: 12-16-1931 DOA: 02/27/2017  PCP: Patient, No Pcp Per  Admit date: 02/27/2017 Discharge date: 03/14/2017  Time spent: over 30 minutes  Recommendations for Outpatient Follow-up:  1. Follow up outpatient CBC/CMP 2. Follow up with Dr. Lindi Adie as outpatient 3. Continue to follow PO intake, improving at discharge, continue marinol for appetite stimulation 4.  Discuss plans for RA with rheumatology.  Discontinued xeljanz.   5. Follow up with ENT as outpatient 6. Patient with ectatic infrarenal abdominal aorta (outpatient follow up based on rad recommendations below).   7. Follow up thyroid function tests in about 6 weeks 8. Pt did have some ?hematuria, would follow up with urology 9. Follow plan for PICC with oncology 10. 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[place]) Active Problems:   Hyperlipidemia   Hypertension   CKD (chronic kidney disease) stage 3, GFR 30-59 ml/min (HCC)   Rheumatoid arthritis of multiple sites with negative rheumatoid factor (HCC)   Pulmonary nodules/lesions, multiple   Generalized lymphadenopathy   Unintentional weight loss   Fever   Atherosclerotic peripheral vascular disease (HCC)   Rash   Discharge Condition: stable  Diet recommendation: heart healthy  Filed Weights   03/08/17 1459 03/10/17 2300 03/14/17 0337  Weight: 96.2 kg (212 lb 1.3 oz) 96.2 kg (212 lb 1.3 oz) 85.5 kg (188 lb 7.9 oz)    History of present illness:  84 year old Caucasian male with Jose Meza past medical history of rheumatoid arthritis chronically on steroids, hypertension was hospitalized due to extensive lymphadenopathy detected on imaging studies. Patient's symptoms included chills fever weight loss shortness of breath. Concern was for lymphoma. 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 Patient was given chemotherapeutic agents on 2/22. He  briefly had an NG tube for poor PO intake, but this has now been removed and his caloric intake is improving.  See below for more detailed hospital course.  Hospital Course:  Acute respiratory failure with hypoxia Initially thought to be due to community-acquired pneumonia. But patient also noted to have widespread lymphadenopathy and pulmonary nodules. These could have also contributed. Patient was started on antibiotics. Due to persistent fevers patient was seen by infectious disease. Influenza PCR and respiratory viral panel was negative. Antibiotics were discontinued. Patient was started on steroids after biopsy was performed.  Patient's respiratory status continues to improve, currently on room air.   Angioimmunoblastic T-cell lymphoma, Newly diagnosed Imaging study findings were concerning for lymphoma. Patient underwent lymph node biopsy which showed Jose Meza lymphoproliferative process but could not offer definitive diagnosis. HIV negative. 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 Patient underwent excisional lymph node biopsy on 2/18. Final pathology suggested angioimmunoblastic T-cell lymphoma. Patient seen by oncology. Patient was given reduced dose R CHOP on 2/22. Monitored for tumor lysis syndrome. Uric acid level did climb and peaked at 8.7. 6.1 on discharge. Continue allopurinol. Bone marrow biopsy was postponed per family request. Echocardiogram showed normal systolic function.   Maculopapular rash Patient was noted to have [name] maculopapular rash to his bilateral arms and chest and back. This has improved most likely due to steroids. Etiology unclear.  Moderate to severe protein calorie malnutrition/tube feeding Patient has lost weight. Patient's son and daughter very concerned that patient has not been eating and drinking much. Tube feeds were offered to the patient 2/19 but he declined. This was again discussed with the  patient on 2/20. Feeding tube was  inserted he was started on tube feedings. Overnight on 2/21 the feeding tube got dislodged. This was repositioned on 2/23. Tube feedings were reinitiated. NG tube removed 2/26 last night by oncology.  This morning with improved appetite.  His appetite was improving at discharge.  Would continue to follow PO intake closely.   Lower extremity edema Lower extremity Doppler studies negative for DVT. Swelling is most likely due to hypoalbuminemia. Edema is mild on discharge, [name]. Compression stockings. Keep legs elevated.  Could give lasix if needed.   Upper extremity swelling/superficial thrombosis No DVT was noted on upper extremity ultrasound, but was notable for superficial thrombosis in the cephalic veins.  Edema improved at discharge, but would continue to monitor.   Urinary tract infection/hematuria Urine culture positive for Klebsiella. Completed course of antibiotics.  Abnormal thyroid function tests TSH was 6.45 earlier in this hospitalization. Repeated by oncology and noted to be 13 on 2/25. Free T4 is 0.82. Free T3 is 1.4 which is on the lower side. However all these values can be abnormal under conditions of acute illness. This was explained in detail to the patient and his family. Patient not exhibiting any signs of hypothyroidism.  Recommend repeating the studies in the next 4-6 weeks. Will not initiate treatment for same.   Normocytic anemia Hemoglobin is stable. Continue to monitor Jose Meza and folic acid levels are not deficient.  Leukocytosis This was likely due to steroids/granix. WBC is now normal.  History of rheumatoid arthritis Holding Xelijanz.  Would discuss plans for RA with rheumatology.  Acute kidney injury on chronic kidney disease stage III Renal function is stable. Continue to monitor urine output.  Essential hypertension Continue metoprolol (increased dose to 75 mg BID). Blood pressure is  stable.  History of GERD Continue PPI  History of anxiety Continue Ativan  Deviated nasal septum Seen by ENT. No acute issue for now. Outpatient follow-up. Flonase.  Ectatic infrarenal abdominal aorta Outpatient follow-up  Procedures: Echo 2/16 Study Conclusions  - Procedure narrative: Transthoracic echocardiography. Image quality was suboptimal. The study was technically difficult, as Shervon Kerwin result of body habitus. - Left ventricle: The cavity size was normal. There was moderate concentric hypertrophy. Systolic function was vigorous. The estimated ejection fraction was in the range of 65% to 70%. - Aortic valve: Calcified with mild to moderate stenosis and trivial regurgitation. Mean gradient (S): 13 mm Hg. Peak gradient (S): 23 mm Hg. Valve area (VTI): 1.56 cm^2. Valve area (Vmax): 1.56 cm^2. Valve area (Vmean): 1.6 cm^2. - Aorta: Dilated aortic root. Aortic root dimension: 41 mm (ED). - Mitral valve: Mildly thickened leaflets . There was trivial regurgitation. - Left atrium: The atrium was mildly dilated. - Tricuspid valve: There was mild regurgitation. - Pulmonary arteries: PA peak pressure: 54 mm Hg (S). - Inferior vena cava: The vessel was dilated. The respirophasic diameter changes were blunted (<50%), consistent with elevated central venous pressure. - Pericardium, extracardiac: Mao Lockner trivial pericardial effusion was identified posterior to the heart.  Impressions:  - Technically difficult study. LVEF 65-70%, moderate LVH, grossly normal wall motion, grade 1 DD, mild to moderate aortic stenosis - trivial AI, estimated AVA around 1.5 cm2, dilated aortic root to 4.1 cm, mild TR, RVSP 54 mmHg, dilated IVC, trivial posterior pericardial effusion.      US Guided Biopsy of L inguinal LN 02/28/17  Excisional Biopsy of L inguinal LN 03/04/17     Consultations:  Surgery  Oncology  IR  ID  ENT  Discharge  Exam: Vitals:  03/14/17 0337 03/14/17 1410  BP: 123/74 132/66  Pulse: 81 82  Resp: 16 18  Temp: (!) 97.5 F (36.4 C) 99.1 F (37.3 C)  SpO2: 97% 96%   Denies pain.  Denies SOB. Appetite continuing to improve.  General: No acute distress. Cardiovascular: Heart sounds show Yaniris Braddock regular rate, and rhythm. No gallops or rubs. No murmurs. No JVD. Lungs: Clear to auscultation bilaterally with good air movement. No rales, rhonchi or wheezes. Abdomen: Soft, nontender, nondistended with normal active bowel sounds. No masses. No hepatosplenomegaly. Neurological: Alert and oriented 3. Moves all extremities 4. Cranial nerves II through XII grossly intact. Skin: Warm and dry. No rashes or lesions. Extremities: No clubbing or cyanosis. Improved lower and upper extremity edema.  Psychiatric: Mood and affect are normal. Insight and judgment are appropirate.  Discharge Instructions   Discharge Instructions    Call MD for:  difficulty breathing, headache or visual disturbances   Complete by:  As directed    Call MD for:  extreme fatigue   Complete by:  As directed    Call MD for:  persistant dizziness or light-headedness   Complete by:  As directed    Call MD for:  persistant nausea and vomiting   Complete by:  As directed    Call MD for:  severe uncontrolled pain   Complete by:  As directed    Call MD for:  temperature >100.4   Complete by:  As directed    Diet - low sodium heart healthy   Complete by:  As directed    Discharge instructions   Complete by:  As directed    You were seen for lymphoma.  You received antibiotics for possible pneumonia.  Your breathing has improved after chemotherapy and steroids.   Your appetite has also improved.  We will continue the [facility] for you as an outpatient.    Stop the xeljanz.  Follow up with rheumatology for your rheumatoid arthritis.   Please establish with Tylisha Danis new primary care doctor.  You will need to ask them to request records so they know  what was done here and what the next steps are.    Please follow up with Dr. Lindi Adie for further treatment.   Return if you have new, recurrent, or worsening symptoms.   Increase activity slowly   Complete by:  As directed    TREATMENT CONDITIONS   Complete by:  As directed    Notify the MD for the following lab values: ANC < 1500, Hemoglobin < 8, PLT < 100,000,  Creatinine > 1.5, urine output < 200 ml prior to cisplatin, Total Bili > 1.5, ALT & AST > 80. If labs are abnormal OR no lab data is available, or if patient has unstable vital signs: Temperature > 38.5, SBP > 180 or < 90, RR > 30 or HR > 100 MD must be notified and order obtained to begin chemotherapy.     Allergies as of 03/14/2017      Reactions   Sulfa Antibiotics Other (See Comments)   Paradoxical effect. Patient states Sulfa does not work for him.      Medication List    STOP taking these medications   metoprolol succinate 100 MG 24 hr tablet Commonly known as:  TOPROL-XL   Tofacitinib Citrate 5 MG Tabs Commonly known as:  XELJANZ     TAKE these medications   allopurinol 100 MG tablet Commonly known as:  ZYLOPRIM Take 1 tablet (100 mg total) by mouth daily.  Start taking on:  03/15/2017   amitriptyline 10 MG tablet Commonly known as:  ELAVIL Take 1 tablet (10 mg total) by mouth at bedtime.   atorvastatin 10 MG tablet Commonly known as:  LIPITOR Take 1 tablet (10 mg total) by mouth daily.   b complex vitamins tablet Take 1 tablet by mouth daily.   carisoprodol 350 MG tablet Commonly known as:  SOMA Take 350 mg by mouth daily as needed for muscle spasms.   cholecalciferol 1000 units tablet Commonly known as:  VITAMIN D Take 1,000 Units by mouth daily.   dronabinol 5 MG capsule Commonly known as:  MARINOL Take 1 capsule (5 mg total) by mouth 2 (two) times daily before lunch and supper.   esomeprazole 40 MG capsule Commonly known as:  NEXIUM Take 1 capsule (40 mg total) by mouth at bedtime. What  changed:    when to take this  reasons to take this   famotidine 10 MG chewable tablet Commonly known as:  PEPCID AC Chew 10 mg by mouth as needed.   feeding supplement (ENSURE ENLIVE) Liqd Take 237 mLs by mouth 2 (two) times daily between meals.   finasteride 5 MG tablet Commonly known as:  PROSCAR Take 5 mg by mouth daily.   fluticasone 50 MCG/ACT nasal spray Commonly known as:  FLONASE Place into both nostrils daily.   glucosamine-chondroitin 500-400 MG tablet Take 1 tablet by mouth 3 (three) times daily.   LORazepam 0.5 MG tablet Commonly known as:  ATIVAN as needed.   Lutein 20 MG Caps Take 20 mg by mouth daily.   meprobamate 200 MG tablet Commonly known as:  EQUANIL Take 2 tablets (400 mg total) by mouth at bedtime as needed (pain.).   Metoprolol Tartrate 75 MG Tabs Take 75 mg by mouth 2 (two) times daily.   montelukast 10 MG tablet Commonly known as:  SINGULAIR   sodium chloride 0.65 % Soln nasal spray Commonly known as:  OCEAN Place 1 spray into both nostrils as needed for congestion.   tamsulosin 0.4 MG Caps capsule Commonly known as:  FLOMAX Take 1 capsule (0.4 mg total) by mouth daily.   vitamin C 500 MG tablet Commonly known as:  ASCORBIC ACID Take 4,000 mg by mouth daily.   zolpidem 12.5 MG CR tablet Commonly known as:  AMBIEN CR Take 12.5 mg by mouth at bedtime as needed for sleep.   zolpidem 5 MG tablet Commonly known as:  AMBIEN Take 1 tablet (5 mg total) by mouth at bedtime as needed for sleep.      Allergies  Allergen Reactions  . Sulfa Antibiotics Other (See Comments)    Paradoxical effect. Patient states Sulfa does not work for him.    Contact information for follow-up providers    [facility] Follow up.   Specialty:  Andrew Why:  [facility] physical therapy Contact information: [place] [address] [phone]        Leighton Ruff, MD Follow up.   Specialty:  General  Surgery Why:  as needed for left groin wound Contact information: [address] [place] [address] [phone]        Nicholas Lose, MD Follow up.   Specialty:  Hematology and Oncology Contact information: [place] [address] [phone]            Contact information for after-discharge care    Destination    [facility]  Follow up.   Service:  Skilled Nursing Contact information: [place] [phone]                   The results of significant diagnostics from this hospitalization (including imaging, microbiology, ancillary and laboratory) are listed below for reference.    Significant Diagnostic Studies: Dg Chest 2 View  Result Date: 03/03/2017 CLINICAL DATA:  Hypoxia. EXAM: CHEST  2 VIEW COMPARISON:  03/02/2017 as well as CT 02/27/2017 FINDINGS: Lungs are somewhat hypoinflated demonstrate persistent bilateral central perihilar opacification slightly worse. Small layering bilateral pleural effusions. Cardiomediastinal silhouette and remainder the exam is unchanged. IMPRESSION: Mild interval worsening bilateral hazy central perihilar opacification which may be due to worsening interstitial edema as infection is also possible. Known bilateral hilar adenopathy. Small pleural effusions unchanged. Electronically Signed   By: Marin Olp M.D.   On: 03/03/2017 10:36   Dg Chest 2 View  Result Date: 03/02/2017 CLINICAL DATA:  Hypoxia. EXAM: CHEST  2 VIEW COMPARISON:  Chest radiograph 02/27/2017 and chest CT 02/27/2017 FINDINGS: Increased densities at the left lung base particularly in the retrocardiac region. Increased interstitial densities at the right lung base. Cardiac silhouette is prominent but similar to the previous examination and accentuated by the AP technique. Evidence for pleural effusions on the lateral view. IMPRESSION: Increased bibasilar chest densities,  particularly on the left side. Findings are suggestive for Shatasia Cutshaw combination of pleural fluid and consolidation/volume loss. Electronically Signed   By: Markus Daft M.D.   On: 03/02/2017 13:53   Dg Chest 2 View  Result Date: 02/27/2017 CLINICAL DATA:  Cough for 2 months EXAM: CHEST  2 VIEW COMPARISON:  01/18/2017 FINDINGS: Cardiac shadow is stable. The lungs are well aerated bilaterally. Diffuse interstitial changes are noted as well as fullness in the region of the right hilum consistent with acute infiltrate. No sizable effusion is seen. No bony abnormality is noted. IMPRESSION: Right basilar infiltrate as well as some superimposed interstitial change new from the prior exam. Continued follow-up is recommended. Electronically Signed   By: Inez Catalina M.D.   On: 02/27/2017 15:39   Ct Angio Chest Pe W And/or Wo Contrast  Result Date: 02/27/2017 CLINICAL DATA:  Cough.  Weight loss.  Abdominal pain. EXAM: CT ANGIOGRAPHY CHEST CT ABDOMEN AND PELVIS WITH CONTRAST TECHNIQUE: Multidetector CT imaging of the chest was performed using the standard protocol during bolus administration of intravenous contrast. Multiplanar CT image reconstructions and MIPs were obtained to evaluate the vascular anatomy. Multidetector CT imaging of the abdomen and pelvis was performed using the standard protocol during bolus administration of intravenous contrast. CONTRAST:  63m ISOVUE-370 IOPAMIDOL (ISOVUE-370) INJECTION 76% COMPARISON:  Chest radiograph from earlier today. 02/25/2015 renal sonogram. FINDINGS: CTA CHEST FINDINGS Cardiovascular: The study is low to moderate quality for the evaluation of pulmonary embolism, with evaluation of the segmental and subsegmental pulmonary artery branches significantly limited by motion artifact. There are no convincing filling defects in the central, lobar, segmental or subsegmental pulmonary artery branches to suggest acute pulmonary embolism. Atherosclerotic nonaneurysmal thoracic aorta.  Dilated main pulmonary artery (3.8 cm diameter). Normal heart size. No significant pericardial fluid/thickening. Left anterior descending and right coronary atherosclerosis. Mediastinum/Nodes: No discrete thyroid nodules. Mildly patulous and otherwise grossly normal esophagus. Mild bilateral axillary adenopathy measuring up to 1.1 cm on the left (series 5/image 23) and 1.0 cm on the right (series 5/image 30). Mildly enlarged level 2 neck lymph nodes bilaterally, largest 1.1 cm on the right (  series 5/image 3). Right paratracheal adenopathy measuring up to 1.7 cm (series 5/image 45). Enlarged 1.8 cm subcarinal node (series 5/image 54). AP window adenopathy measuring up to 1.3 cm (series 5/image 46). Enlarged left prevascular anterior mediastinal nodes up to 1.1 cm (series 5/image 43). Prominently enlarged 2.8 cm right hilar node (series 5/image 56). Enlarged left hilar nodes up to 1.4 cm (series 5/image 57). Lungs/Pleura: No pneumothorax. Small dependent bilateral pleural effusions. Prominent interlobular septal thickening and patchy ground-glass attenuation throughout both lungs. Right infrahilar 3.9 x 2.4 cm mass/adenopathy (series 5/image 71). Numerous poorly marginated subsolid pulmonary nodules throughout both lungs, for example Hartman Minahan 1.8 cm inferior right middle lobe nodule (series 4/image 92), Demon Volante 2.7 cm posterior right lower lobe nodule (series 4/image 70) and Tandrea Kommer 0.8 cm left upper lobe nodule (series 4/image 36). Musculoskeletal: No aggressive appearing focal osseous lesions. Marked thoracic spondylosis. Review of the MIP images confirms the above findings. CT ABDOMEN and PELVIS FINDINGS Motion degraded scan. Hepatobiliary: Normal liver size. Sariah Henkin few scattered subcentimeter hypodense lesions throughout the liver, too small to characterize. Layering cholelithiasis with no gallbladder wall thickening or pericholecystic fluid. No biliary ductal dilatation. Pancreas: Normal, with no mass or duct dilation. Spleen: Normal  size. No mass. Adrenals/Urinary Tract: Normal adrenals. Nonobstructing left nephrolithiasis measuring 12 mm in the interpolar left kidney and 10 mm in the lower left kidney. No hydronephrosis. Multiple simple bilateral renal cysts, largest 6.7 cm in the upper right kidney and 5.4 cm in the interpolar left kidney. Additional subcentimeter hypodense renal cortical lesions in both kidneys are too small to characterize and require no follow-up. Mild diffuse bladder trabeculation with Valleri Hendricksen few small bladder diverticula, largest 2.2 cm in the anterior right bladder wall. Stomach/Bowel: Normal non-distended stomach. Normal caliber small bowel with no small bowel wall thickening. Appendix not discretely visualized. No pericecal inflammatory changes. Normal large bowel with no diverticulosis, large bowel wall thickening or pericolonic fat stranding. Vascular/Lymphatic: Atherosclerotic abdominal aorta with ectatic 2.6 cm infrarenal abdominal aorta. Patent portal, splenic, hepatic and renal veins. Left para-aortic adenopathy measuring up to 1.1 cm (series 16/image 19). Enlarged 1.2 cm aortocaval node (series 16/image 21). Bilateral common iliac, external iliac and borderline inguinal lymphadenopathy. Representative enlarged 1.8 cm left common iliac node (series 16/image 29). Representative enlarged 2.3 cm left external iliac node (series 16/image 44). Representative borderline enlarged 1.3 cm left inguinal node (series 16/image 51). Mildly enlarged bilateral upper retroperitoneal nodes between the adrenal glands and diaphragmatic cru, largest 1.4 cm on the right (series 5/image 98). Reproductive: Markedly enlarged prostate with nonspecific internal prostatic calcifications. Other: No pneumoperitoneum, ascites or focal fluid collection. Musculoskeletal: No aggressive appearing focal osseous lesions. Marked lumbar spondylosis. Nonspecific subcentimeter sclerotic foci in L1, L3 and L4 vertebral bodies. Review of the MIP images  confirms the above findings. IMPRESSION: 1. Motion degraded scan.  No evidence of pulmonary embolism. 2. Widespread lymphadenopathy involving the bilateral neck, bilateral axilla, bilateral mediastinum, bilateral hilum, retroperitoneum and bilateral pelvis. Poorly marginated pulmonary nodules and interlobular septal thickening throughout both lungs. Lymphoma is favored. Widely metastatic primary bronchogenic carcinoma is on the differential given the asymmetrically bulky right hilar/right infrahilar adenopathy, but is considered less likely. 3. Small dependent bilateral pleural effusions. 4. Dilated main pulmonary artery, suggesting pulmonary arterial hypertension. 5. Markedly enlarged prostate. Diffuse bladder trabeculation and small bladder diverticula compatible with chronic bladder outlet obstruction. Nonobstructing left nephrolithiasis. No hydronephrosis. 6. Aortic Atherosclerosis (ICD10-I70.0). Ectatic infrarenal 2.6 cm abdominal aorta at risk for aneurysm development. Recommend followup by ultrasound  in 5 years. This recommendation follows ACR consensus guidelines: White Paper of the ACR Incidental Findings Committee II on Vascular Findings. J Am Coll Radiol 2013; 10:789-794. 7. Cholelithiasis. Electronically Signed   By: Ilona Sorrel M.D.   On: 02/27/2017 17:42   Ct Abdomen Pelvis W Contrast  Result Date: 02/27/2017 CLINICAL DATA:  Cough.  Weight loss.  Abdominal pain. EXAM: CT ANGIOGRAPHY CHEST CT ABDOMEN AND PELVIS WITH CONTRAST TECHNIQUE: Multidetector CT imaging of the chest was performed using the standard protocol during bolus administration of intravenous contrast. Multiplanar CT image reconstructions and MIPs were obtained to evaluate the vascular anatomy. Multidetector CT imaging of the abdomen and pelvis was performed using the standard protocol during bolus administration of intravenous contrast. CONTRAST:  74m ISOVUE-370 IOPAMIDOL (ISOVUE-370) INJECTION 76% COMPARISON:  Chest radiograph from  earlier today. 02/25/2015 renal sonogram. FINDINGS: CTA CHEST FINDINGS Cardiovascular: The study is low to moderate quality for the evaluation of pulmonary embolism, with evaluation of the segmental and subsegmental pulmonary artery branches significantly limited by motion artifact. There are no convincing filling defects in the central, lobar, segmental or subsegmental pulmonary artery branches to suggest acute pulmonary embolism. Atherosclerotic nonaneurysmal thoracic aorta. Dilated main pulmonary artery (3.8 cm diameter). Normal heart size. No significant pericardial fluid/thickening. Left anterior descending and right coronary atherosclerosis. Mediastinum/Nodes: No discrete thyroid nodules. Mildly patulous and otherwise grossly normal esophagus. Mild bilateral axillary adenopathy measuring up to 1.1 cm on the left (series 5/image 23) and 1.0 cm on the right (series 5/image 30). Mildly enlarged level 2 neck lymph nodes bilaterally, largest 1.1 cm on the right (series 5/image 3). Right paratracheal adenopathy measuring up to 1.7 cm (series 5/image 45). Enlarged 1.8 cm subcarinal node (series 5/image 54). AP window adenopathy measuring up to 1.3 cm (series 5/image 46). Enlarged left prevascular anterior mediastinal nodes up to 1.1 cm (series 5/image 43). Prominently enlarged 2.8 cm right hilar node (series 5/image 56). Enlarged left hilar nodes up to 1.4 cm (series 5/image 57). Lungs/Pleura: No pneumothorax. Small dependent bilateral pleural effusions. Prominent interlobular septal thickening and patchy ground-glass attenuation throughout both lungs. Right infrahilar 3.9 x 2.4 cm mass/adenopathy (series 5/image 71). Numerous poorly marginated subsolid pulmonary nodules throughout both lungs, for example Adamae Ricklefs 1.8 cm inferior right middle lobe nodule (series 4/image 92), Esme Freund 2.7 cm posterior right lower lobe nodule (series 4/image 70) and Derin Granquist 0.8 cm left upper lobe nodule (series 4/image 36). Musculoskeletal: No aggressive  appearing focal osseous lesions. Marked thoracic spondylosis. Review of the MIP images confirms the above findings. CT ABDOMEN and PELVIS FINDINGS Motion degraded scan. Hepatobiliary: Normal liver size. Furious Chiarelli few scattered subcentimeter hypodense lesions throughout the liver, too small to characterize. Layering cholelithiasis with no gallbladder wall thickening or pericholecystic fluid. No biliary ductal dilatation. Pancreas: Normal, with no mass or duct dilation. Spleen: Normal size. No mass. Adrenals/Urinary Tract: Normal adrenals. Nonobstructing left nephrolithiasis measuring 12 mm in the interpolar left kidney and 10 mm in the lower left kidney. No hydronephrosis. Multiple simple bilateral renal cysts, largest 6.7 cm in the upper right kidney and 5.4 cm in the interpolar left kidney. Additional subcentimeter hypodense renal cortical lesions in both kidneys are too small to characterize and require no follow-up. Mild diffuse bladder trabeculation with Clem Wisenbaker few small bladder diverticula, largest 2.2 cm in the anterior right bladder wall. Stomach/Bowel: Normal non-distended stomach. Normal caliber small bowel with no small bowel wall thickening. Appendix not discretely visualized. No pericecal inflammatory changes. Normal large bowel with no diverticulosis, large bowel wall  thickening or pericolonic fat stranding. Vascular/Lymphatic: Atherosclerotic abdominal aorta with ectatic 2.6 cm infrarenal abdominal aorta. Patent portal, splenic, hepatic and renal veins. Left para-aortic adenopathy measuring up to 1.1 cm (series 16/image 19). Enlarged 1.2 cm aortocaval node (series 16/image 21). Bilateral common iliac, external iliac and borderline inguinal lymphadenopathy. Representative enlarged 1.8 cm left common iliac node (series 16/image 29). Representative enlarged 2.3 cm left external iliac node (series 16/image 44). Representative borderline enlarged 1.3 cm left inguinal node (series 16/image 51). Mildly enlarged bilateral  upper retroperitoneal nodes between the adrenal glands and diaphragmatic cru, largest 1.4 cm on the right (series 5/image 98). Reproductive: Markedly enlarged prostate with nonspecific internal prostatic calcifications. Other: No pneumoperitoneum, ascites or focal fluid collection. Musculoskeletal: No aggressive appearing focal osseous lesions. Marked lumbar spondylosis. Nonspecific subcentimeter sclerotic foci in L1, L3 and L4 vertebral bodies. Review of the MIP images confirms the above findings. IMPRESSION: 1. Motion degraded scan.  No evidence of pulmonary embolism. 2. Widespread lymphadenopathy involving the bilateral neck, bilateral axilla, bilateral mediastinum, bilateral hilum, retroperitoneum and bilateral pelvis. Poorly marginated pulmonary nodules and interlobular septal thickening throughout both lungs. Lymphoma is favored. Widely metastatic primary bronchogenic carcinoma is on the differential given the asymmetrically bulky right hilar/right infrahilar adenopathy, but is considered less likely. 3. Small dependent bilateral pleural effusions. 4. Dilated main pulmonary artery, suggesting pulmonary arterial hypertension. 5. Markedly enlarged prostate. Diffuse bladder trabeculation and small bladder diverticula compatible with chronic bladder outlet obstruction. Nonobstructing left nephrolithiasis. No hydronephrosis. 6. Aortic Atherosclerosis (ICD10-I70.0). Ectatic infrarenal 2.6 cm abdominal aorta at risk for aneurysm development. Recommend followup by ultrasound in 5 years. This recommendation follows ACR consensus guidelines: White Paper of the ACR Incidental Findings Committee II on Vascular Findings. J Am Coll Radiol 2013; 10:789-794. 7. Cholelithiasis. Electronically Signed   By: Ilona Sorrel M.D.   On: 02/27/2017 17:42   Dg Chest Port 1 View  Result Date: 03/05/2017 CLINICAL DATA:  Hypoxia EXAM: PORTABLE CHEST 1 VIEW COMPARISON:  03/03/2017 FINDINGS: Bilateral airspace opacities are again noted,  unchanged. Heart appears normal size. Possible small layering left effusion. No acute bony abnormality. No real change since prior study. IMPRESSION: No interval change in bilateral perihilar and lower lobe airspace opacities and possible small layering effusion. This could reflect edema or infection. Electronically Signed   By: Rolm Baptise M.D.   On: 03/05/2017 07:39   Dg Abd Portable 1v  Result Date: 03/08/2017 CLINICAL DATA:  84 year old male with NG tube placement EXAM: PORTABLE ABDOMEN - 1 VIEW COMPARISON:  Fluoroscopy dated 03/06/2017 FINDINGS: An enteric tube is not visualized on the provided image. Right-sided PICC with tip in the region of the cavoatrial junction noted. There are bilateral pleural effusions and bilateral lower lung field atelectasis versus infiltrate. Diffuse interstitial and nodular densities noted in the visualized lungs. The cardiac borders are silhouetted. There is degenerative changes of the spine. IMPRESSION: 1. Nonvisualization of the enteric tube. 2. Right-sided PICC with tip in the region of the cavoatrial junction. 3. Bilateral pleural effusions and bilateral mid to lower lung field atelectasis versus infiltrate. Clinical correlation is recommended. Electronically Signed   By: Anner Crete M.D.   On: 03/08/2017 00:29   Dg Loyce Dys Tube Plc W/fl W/rad  Result Date: 03/09/2017 CLINICAL DATA:  Malpositioned nasogastric tube. EXAM: NASO G TUBE PLACEMENT WITH FL AND WITH RAD CONTRAST:  30 cc Isovue-300 - administered into the gastric lumen FLUOROSCOPY TIME:  2 minutes, 36 seconds (13.1 mGy) COMPARISON:  None. FINDINGS: The patient did  not wish to have the existing nasogastric tube removed and requested the tube simply be advanced. As such, existing retracted nasogastric tube was advanced utilizing intermittent fluoroscopic guidance with tip ultimately terminating within the gastric antrum. Contrast injection confirmed appropriate positioning and functionality. IMPRESSION:  Fluoroscopic guided advancement of existing nasogastric tube with tip terminating within the gastric antrum. The nasogastric tube is ready for immediate use. Electronically Signed   By: Sandi Mariscal M.D.   On: 03/09/2017 11:43   Dg Addison Bailey G Tube Plc W/fl W/rad  Result Date: 03/06/2017 CLINICAL DATA:  Malnutrition. Fluoroscopically guided enteric tube placement requested. Reported deviated nasal septum. EXAM: NASO G TUBE PLACEMENT WITH FL AND WITH RAD CONTRAST:  None. FLUOROSCOPY TIME:  Fluoroscopy Time:  1 minute 14 seconds Radiation Exposure Index (if provided by the fluoroscopic device): 40.6 mGy Number of Acquired Spot Images: 0 COMPARISON:  02/27/2017 CT chest, abdomen and pelvis. FINDINGS: Successful right nasal-approach placement of weighted enteric tube under fluoroscopic guidance, with the tip documented in the distal body of the stomach. IMPRESSION: Successful right nasal-approach placement of weighted enteric tube under fluoroscopic guidance, with the tip documented in the distal body of the stomach. Electronically Signed   By: Ilona Sorrel M.D.   On: 03/06/2017 16:20   [place] Core Biopsy (lymph Nodes)  Result Date: 02/28/2017 INDICATION: No known primary, now with extensive lymphadenopathy worrisome for lymphoma. Please perform ultrasound-guided biopsy of dominant left inguinal lymph node for tissue diagnostic purposes. EXAM: ULTRASOUND-GUIDED LEFT INGUINAL LYMPH NODE BIOPSY COMPARISON:  CT the chest, abdomen and pelvis - 02/27/2017 MEDICATIONS: None ANESTHESIA/SEDATION: Moderate (conscious) sedation was employed during this procedure. Demaree Liberto total of Versed 0.5 mg and Fentanyl 25 mcg was administered intravenously. Moderate Sedation Time: 10 minutes. The patient's level of consciousness and vital signs were monitored continuously by radiology nursing throughout the procedure under my direct supervision. COMPLICATIONS: None immediate. TECHNIQUE: Informed written consent was obtained from the patient after Marsha Hillman  discussion of the risks, benefits and alternatives to treatment. Questions regarding the procedure were encouraged and answered. Initial ultrasound scanning demonstrated an approximately 2.2 x 1.3 cm left inguinal lymph node correlating with the dominant lymph node seen on preceding abdominal CT image 51, series 16). An ultrasound image was saved for documentation purposes. The procedure was planned. Hester Forget timeout was performed prior to the initiation of the procedure. The operative was prepped and draped in the usual sterile fashion, and Diyana Starrett sterile drape was applied covering the operative field. Leveda Kendrix timeout was performed prior to the initiation of the procedure. Local anesthesia was provided with 1% lidocaine with epinephrine. Under direct ultrasound guidance, an 18 gauge core needle device was utilized to obtain to obtain 5 core needle biopsies of the dominant left inguinal lymph node. The samples were placed in saline and submitted to pathology. The needle was removed and hemostasis was achieved with manual compression. Post procedure scan was negative for significant hematoma. Travis Mastel dressing was placed. The patient tolerated the procedure well without immediate postprocedural complication. IMPRESSION: Technically successful ultrasound guided biopsy of dominant left inguinal lymph node. Electronically Signed   By: Sandi Mariscal M.D.   On: 02/28/2017 13:58   Ir Picc Placement Right >5 Yrs Inc Img Guide  Result Date: 03/07/2017 INDICATION: Lymphoma. Request for durable IV access to initiate chemotherapy. PICC line requested. EXAM: RIGHT UPPER EXTREMITY PICC LINE PLACEMENT WITH ULTRASOUND AND FLUOROSCOPIC GUIDANCE MEDICATIONS: None; ANESTHESIA/SEDATION: Moderate Sedation Time:  None The patient was continuously monitored during the procedure by the interventional radiology  nurse under my direct supervision. FLUOROSCOPY TIME:  Fluoroscopy Time: 18 seconds COMPLICATIONS: None immediate. PROCEDURE: The patient was advised of  the possible risks and complications and agreed to undergo the procedure. The patient was then brought to the angiographic suite for the procedure. The right arm was prepped with chlorhexidine, draped in the usual sterile fashion using maximum barrier technique (cap and mask, sterile gown, sterile gloves, large sterile sheet, hand hygiene and cutaneous antiseptic). Local anesthesia was attained by infiltration with 1% lidocaine. Ultrasound demonstrated patency of the basilic vein, and this was documented with an image. Under real-time ultrasound guidance, this vein was accessed with Kanija Remmel 21 gauge micropuncture needle and image documentation was performed. The needle was exchanged over Bluma Buresh guidewire for Brayton Baumgartner peel-away sheath through which Talib Headley 44 cm 5 [place] dual lumen power injectable PICC was advanced, and positioned with its tip at the lower SVC/right atrial junction. Fluoroscopy during the procedure and fluoro spot radiograph confirms appropriate catheter position. The catheter was flushed, secured to the skin, and covered with Kinzlee Selvy sterile dressing. IMPRESSION: Successful placement of [place] right arm PICC with sonographic and fluoroscopic guidance. The catheter is ready for use. Read by: Ascencion Dike PA-C Electronically Signed   By: Marybelle Killings M.D.   On: 03/07/2017 16:55    Microbiology: No results found for this or any previous visit (from the past 240 hour(s)).   Labs: Basic Metabolic Panel: Recent Labs  Lab 03/10/17 0500 03/11/17 0500 03/12/17 0542 03/13/17 0505 03/14/17 0650  NA 135 136 135 134* 135  K 4.1 4.2 4.1 4.3 4.5  CL 103 104 101 101 103  CO2 24 24 25 28 27   GLUCOSE 98 138* 91 81 94  BUN 42* 40* 40* 40* 35*  CREATININE 1.55* 1.37* 1.29* 1.25* 1.34*  CALCIUM 8.4* 8.5* 8.2* 8.1* 8.2*  MG 2.3  --   --   --   --    Liver Function Tests: Recent Labs  Lab 03/09/17 0500 03/10/17 0500 03/11/17 0500 03/13/17 0505 03/14/17 0650  AST 19 22 20 20 17   ALT 22 21 21 20 20   ALKPHOS 54 54 51 52 52   BILITOT 0.4 0.5 0.4 0.5 0.7  PROT 6.9 7.2 7.1 7.3 7.3  ALBUMIN 1.9* 2.1* 2.0* 2.2* 2.2*   No results for input(s): LIPASE, AMYLASE in the last 168 hours. No results for input(s): AMMONIA in the last 168 hours. CBC: Recent Labs  Lab 03/10/17 0500 03/11/17 0500 03/12/17 0542 03/13/17 0505 03/14/17 0650  WBC 8.1 6.5 31.6* 16.5* 9.5  HGB 9.7* 9.3* 9.3* 9.4* 9.5*  HCT 29.7* 27.9* 27.9* 28.0* 28.6*  MCV 100.7* 97.6 100.0 101.1* 100.7*  PLT 173 156 193 206 216   Cardiac Enzymes: No results for input(s): CKTOTAL, CKMB, CKMBINDEX, TROPONINI in the last 168 hours. BNP: BNP (last 3 results) Recent Labs    03/03/17 1530  BNP 95.5    ProBNP (last 3 results) No results for input(s): PROBNP in the last 8760 hours.  CBG: Recent Labs  Lab 03/13/17 0446 03/13/17 0753 03/13/17 1139 03/13/17 1625 03/14/17 0755  GLUCAP 90 88 81 117* 87       Signed:  Fayrene Helper MD.  [facility] 03/14/2017, 3:49 PM

## 2017-03-15 ENCOUNTER — Telehealth: Payer: Self-pay | Admitting: Hematology and Oncology

## 2017-03-15 DIAGNOSIS — Z9221 Personal history of antineoplastic chemotherapy: Secondary | ICD-10-CM | POA: Diagnosis not present

## 2017-03-15 DIAGNOSIS — C8590 Non-Hodgkin lymphoma, unspecified, unspecified site: Secondary | ICD-10-CM | POA: Diagnosis not present

## 2017-03-15 DIAGNOSIS — R69 Illness, unspecified: Secondary | ICD-10-CM | POA: Diagnosis not present

## 2017-03-15 DIAGNOSIS — R5381 Other malaise: Secondary | ICD-10-CM | POA: Diagnosis not present

## 2017-03-15 NOTE — Telephone Encounter (Signed)
Spoke to patients daughter regarding upcoming march appointments per 2/28 sch message.

## 2017-03-18 DIAGNOSIS — B372 Candidiasis of skin and nail: Secondary | ICD-10-CM | POA: Diagnosis not present

## 2017-03-18 DIAGNOSIS — K626 Ulcer of anus and rectum: Secondary | ICD-10-CM | POA: Diagnosis not present

## 2017-03-18 DIAGNOSIS — R159 Full incontinence of feces: Secondary | ICD-10-CM | POA: Diagnosis not present

## 2017-03-20 ENCOUNTER — Ambulatory Visit: Payer: Medicare HMO | Admitting: Adult Health

## 2017-03-20 DIAGNOSIS — R159 Full incontinence of feces: Secondary | ICD-10-CM | POA: Diagnosis not present

## 2017-03-20 DIAGNOSIS — C8590 Non-Hodgkin lymphoma, unspecified, unspecified site: Secondary | ICD-10-CM | POA: Diagnosis not present

## 2017-03-20 DIAGNOSIS — Z9221 Personal history of antineoplastic chemotherapy: Secondary | ICD-10-CM | POA: Diagnosis not present

## 2017-03-20 DIAGNOSIS — K626 Ulcer of anus and rectum: Secondary | ICD-10-CM | POA: Diagnosis not present

## 2017-03-21 DIAGNOSIS — D649 Anemia, unspecified: Secondary | ICD-10-CM | POA: Diagnosis not present

## 2017-03-21 DIAGNOSIS — I959 Hypotension, unspecified: Secondary | ICD-10-CM | POA: Diagnosis not present

## 2017-03-22 ENCOUNTER — Other Ambulatory Visit: Payer: Self-pay | Admitting: *Deleted

## 2017-03-22 ENCOUNTER — Telehealth: Payer: Self-pay

## 2017-03-22 ENCOUNTER — Encounter (HOSPITAL_COMMUNITY): Payer: Self-pay

## 2017-03-22 DIAGNOSIS — K626 Ulcer of anus and rectum: Secondary | ICD-10-CM | POA: Diagnosis not present

## 2017-03-22 DIAGNOSIS — D649 Anemia, unspecified: Secondary | ICD-10-CM | POA: Diagnosis not present

## 2017-03-22 DIAGNOSIS — I10 Essential (primary) hypertension: Secondary | ICD-10-CM | POA: Diagnosis not present

## 2017-03-22 DIAGNOSIS — B372 Candidiasis of skin and nail: Secondary | ICD-10-CM | POA: Diagnosis not present

## 2017-03-22 NOTE — Telephone Encounter (Signed)
Returned pt's daughter Jose Meza call with patient listening on line as well.  Daughter would like to know if pt is to be scheduled for [facility] placement as he has a PICC line that is being flushed daily at [facility] but doesn't think the dressing has been changed there.  I confirmed there is an appt on April 15th for chemo infusion.  I told pt and daughter I will route this note to Dr Lindi Adie for Monday to check when [facility] is to be placed and will call pt and daughter back.  Pt says he would like daughter Jose Meza added to his list of contacts we can speak to her number is [phone].  She had a question about what PCP we would recommend for patient and I let daughter Jose Meza know that we can pass that along to Dr Lindi Adie as well for Monday.  No other needs at this time per pt.

## 2017-03-25 ENCOUNTER — Other Ambulatory Visit: Payer: Self-pay

## 2017-03-25 ENCOUNTER — Telehealth: Payer: Self-pay

## 2017-03-25 DIAGNOSIS — Z9221 Personal history of antineoplastic chemotherapy: Secondary | ICD-10-CM | POA: Diagnosis not present

## 2017-03-25 DIAGNOSIS — R000 Tachycardia, unspecified: Secondary | ICD-10-CM | POA: Diagnosis not present

## 2017-03-25 DIAGNOSIS — C8440 Peripheral T-cell lymphoma, not classified, unspecified site: Secondary | ICD-10-CM

## 2017-03-25 DIAGNOSIS — C8590 Non-Hodgkin lymphoma, unspecified, unspecified site: Secondary | ICD-10-CM | POA: Diagnosis not present

## 2017-03-25 DIAGNOSIS — R21 Rash and other nonspecific skin eruption: Secondary | ICD-10-CM | POA: Diagnosis not present

## 2017-03-25 NOTE — Telephone Encounter (Signed)
Spoke with patients daughter regarding rash on patients arm causing discomfort. PA at [facility] would like to add prednisone if that is ok with Dr. Lindi Adie. Verdis Frederickson provided number for me to get in touch with [place], [place].   I called Samantha and informed her per Dr. Lindi Adie to start a MedDose pack for patient regarding this rash.  I also spoke with Verdis Frederickson regarding port placement. Informed her when it was discussed prior he was not in well enough condition to have sedation for port. Now that he is breathing better we can get that scheduled. Informed her I would schedule port placement prior to his next chemo after this coming Friday March 15th.  We also went over his schedule for that Friday. She has good understanding of overall plan and is to call with any further questions.  Cyndia Bent RN

## 2017-03-25 NOTE — Telephone Encounter (Signed)
Spoke with Verdis Frederickson again and explained to her that I spoke with [place], [place] and gave her instructions for [facility] pack per Dr. Lindi Adie for Mr. Dewan. I also informed her of the date/time of port placement. Informed her he is to be NPO past midnight and she asked me to call [facility] and inform them of this as well.    I called [place] [facility] and left VM for scheduling to inform them of his port placement. I attempted to call nursing as well and was put on hold then received dial tone. Call back number was left for scheduling if they had any questions.  Cyndia Bent RN

## 2017-03-26 ENCOUNTER — Telehealth: Payer: Self-pay | Admitting: Hematology and Oncology

## 2017-03-26 NOTE — Telephone Encounter (Signed)
Spoke to patients daughter regarding upcoming march appointments. Patient daughter has requested to cancel treatment and discuss treatment options with provider.

## 2017-03-27 ENCOUNTER — Other Ambulatory Visit: Payer: Self-pay

## 2017-03-27 ENCOUNTER — Telehealth: Payer: Self-pay | Admitting: Hematology and Oncology

## 2017-03-27 ENCOUNTER — Telehealth: Payer: Self-pay

## 2017-03-27 DIAGNOSIS — I10 Essential (primary) hypertension: Secondary | ICD-10-CM | POA: Diagnosis not present

## 2017-03-27 DIAGNOSIS — I70209 Unspecified atherosclerosis of native arteries of extremities, unspecified extremity: Secondary | ICD-10-CM

## 2017-03-27 DIAGNOSIS — R5381 Other malaise: Secondary | ICD-10-CM | POA: Diagnosis not present

## 2017-03-27 DIAGNOSIS — C8590 Non-Hodgkin lymphoma, unspecified, unspecified site: Secondary | ICD-10-CM | POA: Diagnosis not present

## 2017-03-27 DIAGNOSIS — R21 Rash and other nonspecific skin eruption: Secondary | ICD-10-CM | POA: Diagnosis not present

## 2017-03-27 DIAGNOSIS — C8440 Peripheral T-cell lymphoma, not classified, unspecified site: Secondary | ICD-10-CM

## 2017-03-27 NOTE — Telephone Encounter (Signed)
Spoke with daughter in reference to "Dad wants to [facility] rehab as early as Architectural technologist.  Need for Dr. Lindi Adie to consult with Rehab in reference to bed order from rehab to be covered by Medicare.  Imperative for Prednisone order to continue with Hospice for breathing and rash."  Message left for collaborative, routing this call information.

## 2017-03-27 NOTE — Progress Notes (Unsigned)
Pt daughter, Verdis Frederickson,  called and would like to inform Dr.Gudena that pt does not want to start chemo, and be on hospice care instead. She is calling to cancel all future appointments.   Pt is currently at [facility] rehab and had an appt with a hospice nurse this morning from [place] [facility] Riches, RN).  Pt daughter is also requesting to see if Dr.Gudena would put the pt on maintenance prednisone for comfort due to pt rash and difficulty with breathing. Told daughter that will discuss this with MD to see if this was okay to do for comfort.  Spoke with Danise Mina from [facility] regarding pt consultation today. Requesting a referral to be faxed over to them today. They will be ordering a bariatric bed for the pt and have him sent home for hospice care tomorrow. Faxed referral to [phone].

## 2017-03-27 NOTE — Telephone Encounter (Signed)
Patient called to cancel wants to continue with Hospice care.

## 2017-03-28 DIAGNOSIS — R5381 Other malaise: Secondary | ICD-10-CM | POA: Diagnosis not present

## 2017-03-28 DIAGNOSIS — R159 Full incontinence of feces: Secondary | ICD-10-CM | POA: Diagnosis not present

## 2017-03-28 DIAGNOSIS — M069 Rheumatoid arthritis, unspecified: Secondary | ICD-10-CM | POA: Diagnosis not present

## 2017-03-28 DIAGNOSIS — C8590 Non-Hodgkin lymphoma, unspecified, unspecified site: Secondary | ICD-10-CM | POA: Diagnosis not present

## 2017-03-29 ENCOUNTER — Ambulatory Visit: Payer: Medicare HMO | Admitting: Hematology and Oncology

## 2017-03-29 ENCOUNTER — Ambulatory Visit: Payer: Medicare HMO

## 2017-03-29 ENCOUNTER — Other Ambulatory Visit: Payer: Self-pay

## 2017-03-29 ENCOUNTER — Telehealth: Payer: Self-pay

## 2017-03-29 ENCOUNTER — Other Ambulatory Visit: Payer: Medicare HMO

## 2017-03-29 MED ORDER — PREDNISONE 50 MG PO TABS: ORAL_TABLET | ORAL | 0 refills | Status: DC

## 2017-03-29 NOTE — Telephone Encounter (Signed)
Received call from Wells Guiles, [facility]) regarding request for continuation of p.o. Steroid, prednisone. Pt daughter requesting to have this sent to help with rash and patient comfort in breathing. Also, pt is requesting to have picc line removed and will need a verbal order. Discussed with Dr.Gudena and was okay with all the above request. Sent new prescription for steroid and gave verbal order to hospice RN for picc line removal. No further questions or concerns at this time.

## 2017-03-29 NOTE — Progress Notes (Deleted)
Office Visit Note  Patient: Jose Meza             Date of Birth: [birth year]           MRN: [id number]             PCP: Patient, No Pcp Per Referring: Lajean Manes, MD Visit Date: 04/11/2017 Occupation: @GUAROCC @    Subjective:  No chief complaint on file.   History of Present Illness: Jose Meza is a 84 y.o. male ***   Activities of Daily Living:  Patient reports morning stiffness for *** {minute/hour:19697}.   Patient {ACTIONS;DENIES/REPORTS:21021675::"Denies"} nocturnal pain.  Difficulty dressing/grooming: {ACTIONS;DENIES/REPORTS:21021675::"Denies"} Difficulty climbing stairs: {ACTIONS;DENIES/REPORTS:21021675::"Denies"} Difficulty getting out of chair: {ACTIONS;DENIES/REPORTS:21021675::"Denies"} Difficulty using hands for taps, buttons, cutlery, and/or writing: {ACTIONS;DENIES/REPORTS:21021675::"Denies"}   No Rheumatology ROS completed.   PMFS History:  Patient Active Problem List   Diagnosis Date Noted  . 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 Unintentional weight loss 03/05/2017  . Fever 03/05/2017  . Atherosclerotic peripheral vascular disease ([place]) 03/05/2017  . Rash 03/05/2017  . Generalized lymphadenopathy   . Pulmonary nodules/lesions, multiple 02/28/2017  . Rheumatoid arthritis of multiple sites with negative rheumatoid factor ([place]) 01/30/2016  . High risk medication use 01/30/2016  . History of total knee replacement, right 01/30/2016  . Primary osteoarthritis of both hands 01/30/2016  . Primary osteoarthritis of both feet 01/30/2016  . DDD C-spine 01/30/2016  . Osteoarthritis of lumbar spine 01/30/2016  . History of IBS 01/30/2016  . Hyperlipidemia   . DJD (degenerative joint disease) of knee   . Allergic rhinitis, seasonal   . BPH (benign prostatic hyperplasia)   . Hypertension   . Arthritis   . GERD (gastroesophageal reflux disease)   . CKD (chronic kidney disease) stage 3, GFR 30-59 ml/min (HCC)   . Polyp of colon,  adenomatous 07/15/2009    Past Medical History:  Diagnosis Date  . Allergic rhinitis, seasonal   . Arthritis    neg rheum eval  . BPH (benign prostatic hypertrophy)    recurrent prostatitis, hx BOO  . Cauda equina, syndrome   . Chronic kidney disease    stage 2-3  . DJD (degenerative joint disease) of knee    right knee, s/p knee replacement 12/06  . GERD (gastroesophageal reflux disease)   . Hyperlipidemia   . Hypertension   . IBS (irritable bowel syndrome)    constipation prone  . Incomplete RBBB    ECG 05/08/13  . Polyp of colon, adenomatous 07/2009   buccini  . Rheumatoid arthritis ([place])     Family History  Problem Relation Age of Onset  . Colon cancer Mother    Past Surgical History:  Procedure Laterality Date  . INGUINAL LYMPH NODE BIOPSY Bilateral 03/04/2017   Procedure: INGUINAL LYMPH NODE BIOPSY LEFT;  Surgeon: Leighton Ruff, MD;  Location: [facility];  Service: General;  Laterality: Bilateral;  . JOINT REPLACEMENT  12/2004   R TKR  . KNEE ARTHROPLASTY    . Right knee replacement  2007  . [place]  . 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   Physical Exam   Musculoskeletal Exam: ***  CDAI Exam: No CDAI exam completed.    Investigation: No additional findings. CBC Latest  Ref Rng & Units 03/14/2017 03/13/2017 03/12/2017  WBC 4.0 - 10.5 K/uL 9.5 16.5(H) 31.6(H)  Hemoglobin 13.0 - 17.0 g/dL 9.5(L) 9.4(L) 9.3(L)  Hematocrit 39.0 - 52.0 % 28.6(L) 28.0(L) 27.9(L)  Platelets 150 - 400 K/uL 216 206 193   CMP Latest Ref Rng & Units 03/14/2017 03/13/2017 03/12/2017  Glucose 65 - 99 mg/dL 94 81 91  BUN 6 - 20 mg/dL 35(H) 40(H) 40(H)  Creatinine 0.61 - 1.24 mg/dL 1.34(H) 1.25(H) 1.29(H)  Sodium 135 - 145 mmol/L 135 134(L) 135  Potassium 3.5 - 5.1 mmol/L 4.5 4.3  4.1  Chloride 101 - 111 mmol/L 103 101 101  CO2 22 - 32 mmol/L 27 28 25   Calcium 8.9 - 10.3 mg/dL 8.2(L) 8.1(L) 8.2(L)  Total Protein 6.5 - 8.1 g/dL 7.3 7.3 -  Total Bilirubin 0.3 - 1.2 mg/dL 0.7 0.5 -  Alkaline Phos 38 - 126 U/L 52 52 -  AST 15 - 41 U/L 17 20 -  ALT 17 - 63 U/L 20 20 -    Imaging: Dg Chest 2 View  Result Date: 03/03/2017 CLINICAL DATA:  Hypoxia. EXAM: CHEST  2 VIEW COMPARISON:  03/02/2017 as well as CT 02/27/2017 FINDINGS: Lungs are somewhat hypoinflated demonstrate persistent bilateral central perihilar opacification slightly worse. Small layering bilateral pleural effusions. Cardiomediastinal silhouette and remainder the exam is unchanged. IMPRESSION: Mild interval worsening bilateral hazy central perihilar opacification which may be due to worsening interstitial edema as infection is also possible. Known bilateral hilar adenopathy. Small pleural effusions unchanged. Electronically Signed   By: Marin Olp M.D.   On: 03/03/2017 10:36   Dg Chest 2 View  Result Date: 03/02/2017 CLINICAL DATA:  Hypoxia. EXAM: CHEST  2 VIEW COMPARISON:  Chest radiograph 02/27/2017 and chest CT 02/27/2017 FINDINGS: Increased densities at the left lung base particularly in the retrocardiac region. Increased interstitial densities at the right lung base. Cardiac silhouette is prominent but similar to the previous examination and accentuated by the AP technique. Evidence for pleural effusions on the lateral view. IMPRESSION: Increased bibasilar chest densities, particularly on the left side. Findings are suggestive for a combination of pleural fluid and consolidation/volume loss. Electronically Signed   By: Markus Daft M.D.   On: 03/02/2017 13:53   Dg Chest 2 View  Result Date: 02/27/2017 CLINICAL DATA:  Cough for 2 months EXAM: CHEST  2 VIEW COMPARISON:  01/18/2017 FINDINGS: Cardiac shadow is stable. The lungs are well aerated bilaterally. Diffuse interstitial changes are noted as well as fullness  in the region of the right hilum consistent with acute infiltrate. No sizable effusion is seen. No bony abnormality is noted. IMPRESSION: Right basilar infiltrate as well as some superimposed interstitial change new from the prior exam. Continued follow-up is recommended. Electronically Signed   By: Inez Catalina M.D.   On: 02/27/2017 15:39   Ct Angio Chest Pe W And/or Wo Contrast  Result Date: 02/27/2017 CLINICAL DATA:  Cough.  Weight loss.  Abdominal pain. EXAM: CT ANGIOGRAPHY CHEST CT ABDOMEN AND PELVIS WITH CONTRAST TECHNIQUE: Multidetector CT imaging of the chest was performed using the standard protocol during bolus administration of intravenous contrast. Multiplanar CT image reconstructions and MIPs were obtained to evaluate the vascular anatomy. Multidetector CT imaging of the abdomen and pelvis was performed using the standard protocol during bolus administration of intravenous contrast. CONTRAST:  15mL ISOVUE-370 IOPAMIDOL (ISOVUE-370) INJECTION 76% COMPARISON:  Chest radiograph from earlier today. 02/25/2015 renal sonogram. FINDINGS: CTA CHEST FINDINGS Cardiovascular: The study is low to moderate quality  for the evaluation of pulmonary embolism, with evaluation of the segmental and subsegmental pulmonary artery branches significantly limited by motion artifact. There are no convincing filling defects in the central, lobar, segmental or subsegmental pulmonary artery branches to suggest acute pulmonary embolism. Atherosclerotic nonaneurysmal thoracic aorta. Dilated main pulmonary artery (3.8 cm diameter). Normal heart size. No significant pericardial fluid/thickening. Left anterior descending and right coronary atherosclerosis. Mediastinum/Nodes: No discrete thyroid nodules. Mildly patulous and otherwise grossly normal esophagus. Mild bilateral axillary adenopathy measuring up to 1.1 cm on the left (series 5/image 23) and 1.0 cm on the right (series 5/image 30). Mildly enlarged level 2 neck lymph nodes  bilaterally, largest 1.1 cm on the right (series 5/image 3). Right paratracheal adenopathy measuring up to 1.7 cm (series 5/image 45). Enlarged 1.8 cm subcarinal node (series 5/image 54). AP window adenopathy measuring up to 1.3 cm (series 5/image 46). Enlarged left prevascular anterior mediastinal nodes up to 1.1 cm (series 5/image 43). Prominently enlarged 2.8 cm right hilar node (series 5/image 56). Enlarged left hilar nodes up to 1.4 cm (series 5/image 57). Lungs/Pleura: No pneumothorax. Small dependent bilateral pleural effusions. Prominent interlobular septal thickening and patchy ground-glass attenuation throughout both lungs. Right infrahilar 3.9 x 2.4 cm mass/adenopathy (series 5/image 71). Numerous poorly marginated subsolid pulmonary nodules throughout both lungs, for example a 1.8 cm inferior right middle lobe nodule (series 4/image 92), a 2.7 cm posterior right lower lobe nodule (series 4/image 70) and a 0.8 cm left upper lobe nodule (series 4/image 36). Musculoskeletal: No aggressive appearing focal osseous lesions. Marked thoracic spondylosis. Review of the MIP images confirms the above findings. CT ABDOMEN and PELVIS FINDINGS Motion degraded scan. Hepatobiliary: Normal liver size. A few scattered subcentimeter hypodense lesions throughout the liver, too small to characterize. Layering cholelithiasis with no gallbladder wall thickening or pericholecystic fluid. No biliary ductal dilatation. Pancreas: Normal, with no mass or duct dilation. Spleen: Normal size. No mass. Adrenals/Urinary Tract: Normal adrenals. Nonobstructing left nephrolithiasis measuring 12 mm in the interpolar left kidney and 10 mm in the lower left kidney. No hydronephrosis. Multiple simple bilateral renal cysts, largest 6.7 cm in the upper right kidney and 5.4 cm in the interpolar left kidney. Additional subcentimeter hypodense renal cortical lesions in both kidneys are too small to characterize and require no follow-up. Mild  diffuse bladder trabeculation with a few small bladder diverticula, largest 2.2 cm in the anterior right bladder wall. Stomach/Bowel: Normal non-distended stomach. Normal caliber small bowel with no small bowel wall thickening. Appendix not discretely visualized. No pericecal inflammatory changes. Normal large bowel with no diverticulosis, large bowel wall thickening or pericolonic fat stranding. Vascular/Lymphatic: Atherosclerotic abdominal aorta with ectatic 2.6 cm infrarenal abdominal aorta. Patent portal, splenic, hepatic and renal veins. Left para-aortic adenopathy measuring up to 1.1 cm (series 16/image 19). Enlarged 1.2 cm aortocaval node (series 16/image 21). Bilateral common iliac, external iliac and borderline inguinal lymphadenopathy. Representative enlarged 1.8 cm left common iliac node (series 16/image 29). Representative enlarged 2.3 cm left external iliac node (series 16/image 44). Representative borderline enlarged 1.3 cm left inguinal node (series 16/image 51). Mildly enlarged bilateral upper retroperitoneal nodes between the adrenal glands and diaphragmatic cru, largest 1.4 cm on the right (series 5/image 98). Reproductive: Markedly enlarged prostate with nonspecific internal prostatic calcifications. Other: No pneumoperitoneum, ascites or focal fluid collection. Musculoskeletal: No aggressive appearing focal osseous lesions. Marked lumbar spondylosis. Nonspecific subcentimeter sclerotic foci in L1, L3 and L4 vertebral bodies. Review of the MIP images confirms the above findings. IMPRESSION: 1. Motion  degraded scan.  No evidence of pulmonary embolism. 2. Widespread lymphadenopathy involving the bilateral neck, bilateral axilla, bilateral mediastinum, bilateral hilum, retroperitoneum and bilateral pelvis. Poorly marginated pulmonary nodules and interlobular septal thickening throughout both lungs. Lymphoma is favored. Widely metastatic primary bronchogenic carcinoma is on the differential given the  asymmetrically bulky right hilar/right infrahilar adenopathy, but is considered less likely. 3. Small dependent bilateral pleural effusions. 4. Dilated main pulmonary artery, suggesting pulmonary arterial hypertension. 5. Markedly enlarged prostate. Diffuse bladder trabeculation and small bladder diverticula compatible with chronic bladder outlet obstruction. Nonobstructing left nephrolithiasis. No hydronephrosis. 6. Aortic Atherosclerosis (ICD10-I70.0). Ectatic infrarenal 2.6 cm abdominal aorta at risk for aneurysm development. Recommend followup by ultrasound in 5 years. This recommendation follows ACR consensus guidelines: White Paper of the ACR Incidental Findings Committee II on Vascular Findings. J Am Coll Radiol 2013; 10:789-794. 7. Cholelithiasis. Electronically Signed   By: Ilona Sorrel M.D.   On: 02/27/2017 17:42   Ct Abdomen Pelvis W Contrast  Result Date: 02/27/2017 CLINICAL DATA:  Cough.  Weight loss.  Abdominal pain. EXAM: CT ANGIOGRAPHY CHEST CT ABDOMEN AND PELVIS WITH CONTRAST TECHNIQUE: Multidetector CT imaging of the chest was performed using the standard protocol during bolus administration of intravenous contrast. Multiplanar CT image reconstructions and MIPs were obtained to evaluate the vascular anatomy. Multidetector CT imaging of the abdomen and pelvis was performed using the standard protocol during bolus administration of intravenous contrast. CONTRAST:  21mL ISOVUE-370 IOPAMIDOL (ISOVUE-370) INJECTION 76% COMPARISON:  Chest radiograph from earlier today. 02/25/2015 renal sonogram. FINDINGS: CTA CHEST FINDINGS Cardiovascular: The study is low to moderate quality for the evaluation of pulmonary embolism, with evaluation of the segmental and subsegmental pulmonary artery branches significantly limited by motion artifact. There are no convincing filling defects in the central, lobar, segmental or subsegmental pulmonary artery branches to suggest acute pulmonary embolism. Atherosclerotic  nonaneurysmal thoracic aorta. Dilated main pulmonary artery (3.8 cm diameter). Normal heart size. No significant pericardial fluid/thickening. Left anterior descending and right coronary atherosclerosis. Mediastinum/Nodes: No discrete thyroid nodules. Mildly patulous and otherwise grossly normal esophagus. Mild bilateral axillary adenopathy measuring up to 1.1 cm on the left (series 5/image 23) and 1.0 cm on the right (series 5/image 30). Mildly enlarged level 2 neck lymph nodes bilaterally, largest 1.1 cm on the right (series 5/image 3). Right paratracheal adenopathy measuring up to 1.7 cm (series 5/image 45). Enlarged 1.8 cm subcarinal node (series 5/image 54). AP window adenopathy measuring up to 1.3 cm (series 5/image 46). Enlarged left prevascular anterior mediastinal nodes up to 1.1 cm (series 5/image 43). Prominently enlarged 2.8 cm right hilar node (series 5/image 56). Enlarged left hilar nodes up to 1.4 cm (series 5/image 57). Lungs/Pleura: No pneumothorax. Small dependent bilateral pleural effusions. Prominent interlobular septal thickening and patchy ground-glass attenuation throughout both lungs. Right infrahilar 3.9 x 2.4 cm mass/adenopathy (series 5/image 71). Numerous poorly marginated subsolid pulmonary nodules throughout both lungs, for example a 1.8 cm inferior right middle lobe nodule (series 4/image 92), a 2.7 cm posterior right lower lobe nodule (series 4/image 70) and a 0.8 cm left upper lobe nodule (series 4/image 36). Musculoskeletal: No aggressive appearing focal osseous lesions. Marked thoracic spondylosis. Review of the MIP images confirms the above findings. CT ABDOMEN and PELVIS FINDINGS Motion degraded scan. Hepatobiliary: Normal liver size. A few scattered subcentimeter hypodense lesions throughout the liver, too small to characterize. Layering cholelithiasis with no gallbladder wall thickening or pericholecystic fluid. No biliary ductal dilatation. Pancreas: Normal, with no mass or  duct dilation. Spleen:  Normal size. No mass. Adrenals/Urinary Tract: Normal adrenals. Nonobstructing left nephrolithiasis measuring 12 mm in the interpolar left kidney and 10 mm in the lower left kidney. No hydronephrosis. Multiple simple bilateral renal cysts, largest 6.7 cm in the upper right kidney and 5.4 cm in the interpolar left kidney. Additional subcentimeter hypodense renal cortical lesions in both kidneys are too small to characterize and require no follow-up. Mild diffuse bladder trabeculation with a few small bladder diverticula, largest 2.2 cm in the anterior right bladder wall. Stomach/Bowel: Normal non-distended stomach. Normal caliber small bowel with no small bowel wall thickening. Appendix not discretely visualized. No pericecal inflammatory changes. Normal large bowel with no diverticulosis, large bowel wall thickening or pericolonic fat stranding. Vascular/Lymphatic: Atherosclerotic abdominal aorta with ectatic 2.6 cm infrarenal abdominal aorta. Patent portal, splenic, hepatic and renal veins. Left para-aortic adenopathy measuring up to 1.1 cm (series 16/image 19). Enlarged 1.2 cm aortocaval node (series 16/image 21). Bilateral common iliac, external iliac and borderline inguinal lymphadenopathy. Representative enlarged 1.8 cm left common iliac node (series 16/image 29). Representative enlarged 2.3 cm left external iliac node (series 16/image 44). Representative borderline enlarged 1.3 cm left inguinal node (series 16/image 51). Mildly enlarged bilateral upper retroperitoneal nodes between the adrenal glands and diaphragmatic cru, largest 1.4 cm on the right (series 5/image 98). Reproductive: Markedly enlarged prostate with nonspecific internal prostatic calcifications. Other: No pneumoperitoneum, ascites or focal fluid collection. Musculoskeletal: No aggressive appearing focal osseous lesions. Marked lumbar spondylosis. Nonspecific subcentimeter sclerotic foci in L1, L3 and L4 vertebral bodies.  Review of the MIP images confirms the above findings. IMPRESSION: 1. Motion degraded scan.  No evidence of pulmonary embolism. 2. Widespread lymphadenopathy involving the bilateral neck, bilateral axilla, bilateral mediastinum, bilateral hilum, retroperitoneum and bilateral pelvis. Poorly marginated pulmonary nodules and interlobular septal thickening throughout both lungs. Lymphoma is favored. Widely metastatic primary bronchogenic carcinoma is on the differential given the asymmetrically bulky right hilar/right infrahilar adenopathy, but is considered less likely. 3. Small dependent bilateral pleural effusions. 4. Dilated main pulmonary artery, suggesting pulmonary arterial hypertension. 5. Markedly enlarged prostate. Diffuse bladder trabeculation and small bladder diverticula compatible with chronic bladder outlet obstruction. Nonobstructing left nephrolithiasis. No hydronephrosis. 6. Aortic Atherosclerosis (ICD10-I70.0). Ectatic infrarenal 2.6 cm abdominal aorta at risk for aneurysm development. Recommend followup by ultrasound in 5 years. This recommendation follows ACR consensus guidelines: White Paper of the ACR Incidental Findings Committee II on Vascular Findings. J Am Coll Radiol 2013; 10:789-794. 7. Cholelithiasis. Electronically Signed   By: Ilona Sorrel M.D.   On: 02/27/2017 17:42   Dg Chest Port 1 View  Result Date: 03/05/2017 CLINICAL DATA:  Hypoxia EXAM: PORTABLE CHEST 1 VIEW COMPARISON:  03/03/2017 FINDINGS: Bilateral airspace opacities are again noted, unchanged. Heart appears normal size. Possible small layering left effusion. No acute bony abnormality. No real change since prior study. IMPRESSION: No interval change in bilateral perihilar and lower lobe airspace opacities and possible small layering effusion. This could reflect edema or infection. Electronically Signed   By: Rolm Baptise M.D.   On: 03/05/2017 07:39   Dg Abd Portable 1v  Result Date: 03/08/2017 CLINICAL DATA:  84 year old  male with NG tube placement EXAM: PORTABLE ABDOMEN - 1 VIEW COMPARISON:  Fluoroscopy dated 03/06/2017 FINDINGS: An enteric tube is not visualized on the provided image. Right-sided PICC with tip in the region of the cavoatrial junction noted. There are bilateral pleural effusions and bilateral lower lung field atelectasis versus infiltrate. Diffuse interstitial and nodular densities noted in the visualized lungs. The  cardiac borders are silhouetted. There is degenerative changes of the spine. IMPRESSION: 1. Nonvisualization of the enteric tube. 2. Right-sided PICC with tip in the region of the cavoatrial junction. 3. Bilateral pleural effusions and bilateral mid to lower lung field atelectasis versus infiltrate. Clinical correlation is recommended. Electronically Signed   By: Anner Crete M.D.   On: 03/08/2017 00:29   Dg [place] Dys Tube Plc W/fl W/rad  Result Date: 03/09/2017 CLINICAL DATA:  Malpositioned nasogastric tube. EXAM: NASO G TUBE PLACEMENT WITH FL AND WITH RAD CONTRAST:  30 cc Isovue-300 - administered into the gastric lumen FLUOROSCOPY TIME:  2 minutes, 36 seconds (13.1 mGy) COMPARISON:  None. FINDINGS: The patient did not wish to have the existing nasogastric tube removed and requested the tube simply be advanced. As such, existing retracted nasogastric tube was advanced utilizing intermittent fluoroscopic guidance with tip ultimately terminating within the gastric antrum. Contrast injection confirmed appropriate positioning and functionality. IMPRESSION: Fluoroscopic guided advancement of existing nasogastric tube with tip terminating within the gastric antrum. The nasogastric tube is ready for immediate use. Electronically Signed   By: Sandi Mariscal M.D.   On: 03/09/2017 11:43   Dg [place]ison Bailey G Tube Plc W/fl W/rad  Result Date: 03/06/2017 CLINICAL DATA:  Malnutrition. Fluoroscopically guided enteric tube placement requested. Reported deviated nasal septum. EXAM: NASO G TUBE PLACEMENT WITH FL AND WITH  RAD CONTRAST:  None. FLUOROSCOPY TIME:  Fluoroscopy Time:  1 minute 14 seconds Radiation Exposure Index (if provided by the fluoroscopic device): 40.6 mGy Number of Acquired Spot Images: 0 COMPARISON:  02/27/2017 CT chest, abdomen and pelvis. FINDINGS: Successful right nasal-approach placement of weighted enteric tube under fluoroscopic guidance, with the tip documented in the distal body of the stomach. IMPRESSION: Successful right nasal-approach placement of weighted enteric tube under fluoroscopic guidance, with the tip documented in the distal body of the stomach. Electronically Signed   By: Ilona Sorrel M.D.   On: 03/06/2017 16:20   [place] Core Biopsy (lymph Nodes)  Result Date: 02/28/2017 INDICATION: No known primary, now with extensive lymphadenopathy worrisome for lymphoma. Please perform ultrasound-guided biopsy of dominant left inguinal lymph node for tissue diagnostic purposes. EXAM: ULTRASOUND-GUIDED LEFT INGUINAL LYMPH NODE BIOPSY COMPARISON:  CT the chest, abdomen and pelvis - 02/27/2017 MEDICATIONS: None ANESTHESIA/SEDATION: Moderate (conscious) sedation was employed during this procedure. A total of Versed 0.5 mg and Fentanyl 25 mcg was administered intravenously. Moderate Sedation Time: 10 minutes. The patient's level of consciousness and vital signs were monitored continuously by radiology nursing throughout the procedure under my direct supervision. COMPLICATIONS: None immediate. TECHNIQUE: Informed written consent was obtained from the patient after a discussion of the risks, benefits and alternatives to treatment. Questions regarding the procedure were encouraged and answered. Initial ultrasound scanning demonstrated an approximately 2.2 x 1.3 cm left inguinal lymph node correlating with the dominant lymph node seen on preceding abdominal CT image 51, series 16). An ultrasound image was saved for documentation purposes. The procedure was planned. A timeout was performed prior to the initiation  of the procedure. The operative was prepped and draped in the usual sterile fashion, and a sterile drape was applied covering the operative field. A timeout was performed prior to the initiation of the procedure. Local anesthesia was provided with 1% lidocaine with epinephrine. Under direct ultrasound guidance, an 18 gauge core needle device was utilized to obtain to obtain 5 core needle biopsies of the dominant left inguinal lymph node. The samples were placed in saline and submitted to pathology. The  needle was removed and hemostasis was achieved with manual compression. Post procedure scan was negative for significant hematoma. A dressing was placed. The patient tolerated the procedure well without immediate postprocedural complication. IMPRESSION: Technically successful ultrasound guided biopsy of dominant left inguinal lymph node. Electronically Signed   By: Sandi Mariscal M.D.   On: 02/28/2017 13:58   Ir Picc Placement Right >5 Yrs Inc Img Guide  Result Date: 03/07/2017 INDICATION: Lymphoma. Request for durable IV access to initiate chemotherapy. PICC line requested. EXAM: RIGHT UPPER EXTREMITY PICC LINE PLACEMENT WITH ULTRASOUND AND FLUOROSCOPIC GUIDANCE MEDICATIONS: None; ANESTHESIA/SEDATION: Moderate Sedation Time:  None The patient was continuously monitored during the procedure by the interventional radiology nurse under my direct supervision. FLUOROSCOPY TIME:  Fluoroscopy Time: 18 seconds COMPLICATIONS: None immediate. PROCEDURE: The patient was advised of the possible risks and complications and agreed to undergo the procedure. The patient was then brought to the angiographic suite for the procedure. The right arm was prepped with chlorhexidine, draped in the usual sterile fashion using maximum barrier technique (cap and mask, sterile gown, sterile gloves, large sterile sheet, hand hygiene and cutaneous antiseptic). Local anesthesia was attained by infiltration with 1% lidocaine. Ultrasound  demonstrated patency of the basilic vein, and this was documented with an image. Under real-time ultrasound guidance, this vein was accessed with a 21 gauge micropuncture needle and image documentation was performed. The needle was exchanged over a guidewire for a peel-away sheath through which a 44 cm 5 [place] dual lumen power injectable PICC was advanced, and positioned with its tip at the lower SVC/right atrial junction. Fluoroscopy during the procedure and fluoro spot radiograph confirms appropriate catheter position. The catheter was flushed, secured to the skin, and covered with a sterile dressing. IMPRESSION: Successful placement of a right arm PICC with sonographic and fluoroscopic guidance. The catheter is ready for use. Read by: Ascencion Dike PA-C Electronically Signed   By: Marybelle Killings M.D.   On: 03/07/2017 16:55    Speciality Comments: No specialty comments available.    Procedures:  No procedures performed Allergies: Sulfa antibiotics   Assessment / Plan:     Visit Diagnoses: No diagnosis found.    Orders: No orders of the defined types were placed in this encounter.  No orders of the defined types were placed in this encounter.   Face-to-face time spent with patient was *** minutes. 50% of time was spent in counseling and coordination of care.  Follow-Up Instructions: No Follow-up on file.   Earnestine Mealing, CMA  Note - This record has been created using Editor, commissioning.  Chart creation errors have been sought, but may not always  have been located. Such creation errors do not reflect on  the standard of medical care.

## 2017-04-01 ENCOUNTER — Ambulatory Visit (HOSPITAL_COMMUNITY): Payer: Medicare HMO

## 2017-04-01 ENCOUNTER — Other Ambulatory Visit (HOSPITAL_COMMUNITY): Payer: Medicare HMO

## 2017-04-04 ENCOUNTER — Telehealth: Payer: Self-pay

## 2017-04-04 NOTE — Telephone Encounter (Signed)
Returned call to Hospice RN per Dr. Lindi Adie ok to add hydrocodone 5-325 1-2 tabs q6hrs and to increase ativan 1mg  2 times daily to q4prn. Verbalized to Hospice RN. No further questions at this time.  Cyndia Bent RN

## 2017-04-10 DIAGNOSIS — R69 Illness, unspecified: Secondary | ICD-10-CM | POA: Diagnosis not present

## 2017-04-11 ENCOUNTER — Ambulatory Visit: Payer: Medicare Other | Admitting: Rheumatology

## 2017-04-12 DIAGNOSIS — R69 Illness, unspecified: Secondary | ICD-10-CM | POA: Diagnosis not present

## 2017-04-17 ENCOUNTER — Telehealth: Payer: Self-pay

## 2017-04-17 NOTE — Telephone Encounter (Signed)
Returned Hospice RN call and left VM stating per Dr. Lindi Adie ok to switch patients oral pain medication to oxycodone 5mg  q4-6hr prn. Left direct call back number for any further questions.  Cyndia Bent RN

## 2017-04-18 ENCOUNTER — Other Ambulatory Visit: Payer: Self-pay

## 2017-04-18 DIAGNOSIS — R69 Illness, unspecified: Secondary | ICD-10-CM | POA: Diagnosis not present

## 2017-04-18 MED ORDER — OXYCODONE HCL 5 MG PO TABS: 5.0000 mg | ORAL_TABLET | ORAL | 0 refills | Status: AC | PRN

## 2017-04-19 DIAGNOSIS — R69 Illness, unspecified: Secondary | ICD-10-CM | POA: Diagnosis not present

## 2017-04-24 ENCOUNTER — Other Ambulatory Visit: Payer: Self-pay | Admitting: Hematology and Oncology

## 2017-04-26 ENCOUNTER — Other Ambulatory Visit: Payer: Self-pay | Admitting: *Deleted

## 2017-04-27 DIAGNOSIS — R69 Illness, unspecified: Secondary | ICD-10-CM | POA: Diagnosis not present

## 2017-05-01 ENCOUNTER — Telehealth: Payer: Self-pay

## 2017-05-01 ENCOUNTER — Other Ambulatory Visit: Payer: Self-pay

## 2017-05-01 MED ORDER — LORAZEPAM 1 MG PO TABS: ORAL_TABLET | ORAL | 0 refills | Status: AC

## 2017-05-01 NOTE — Telephone Encounter (Signed)
Returned call from [place], [facility] RN and left VM regarding ativan refill and increase. Per Dr. Lindi Adie is ok with increase.  Cyndia Bent RN

## 2017-05-06 ENCOUNTER — Telehealth: Payer: Self-pay

## 2017-05-06 ENCOUNTER — Telehealth: Payer: Self-pay | Admitting: *Deleted

## 2017-05-06 NOTE — Telephone Encounter (Signed)
1440- At 1130 am, Raquisha from [facility] called about Mr. Vivian's pain increasing.  He is on Oxycodone 5 mg and is now taking two at a time for pain.  He is also on metoprolol and they halved his dose to 12.5mg  due to low blood pressure but high heart rate.  His family is very concerned so [place] called [place] and got sent to me and then also left a message with triage that [place] received and Val is going to assist with.  Gardiner Rhyme

## 2017-05-06 NOTE — Telephone Encounter (Signed)
This RN attempted to contact [place] with [facility] per her call stating ongoing issues and changes in pt with request for MD recommendations.  Return call number given as [phone].  Obtained VM - message left to return call to this RN for further discussion as well as attending MD is out of the office.  This RN then called to [facility] and spoke with Triage nurse- Judeen Hammans per above. Informed Judeen Hammans Dr Lindi Adie is out of the office this week and noted changes including being put on antibiotics this weekend for a possible urine infection and concerns stated by hospice nurse- this RN requested if MD attending at Hospice could assist and or to call to this RN so on call MD could be consulted.  Judeen Hammans will follow up with above. This RN's name and direct call back number given.

## 2017-05-08 DIAGNOSIS — R69 Illness, unspecified: Secondary | ICD-10-CM | POA: Diagnosis not present

## 2017-05-09 ENCOUNTER — Telehealth: Payer: Self-pay

## 2017-05-09 NOTE — Telephone Encounter (Signed)
Received call from [facility]) that pt had passed away this afternoon peacefully at home, surrounded by his family. Will notify Dr.Gudena about the news and will cancel any future appts.

## 2017-05-15 DEATH — deceased

## 2017-12-18 NOTE — Telephone Encounter (Signed)
Called [employer], to cancel patient assistance, patient passed away in 2017/06/01

## 2018-07-07 IMAGING — DX DG CHEST 2V
2 series · 2 of 2 positions shown · non-contrast
Comparison: 03/02/2017 as well as CT 02/27/2017

CLINICAL DATA: Hypoxia.

EXAM:
CHEST  2 VIEW

[chest lat]
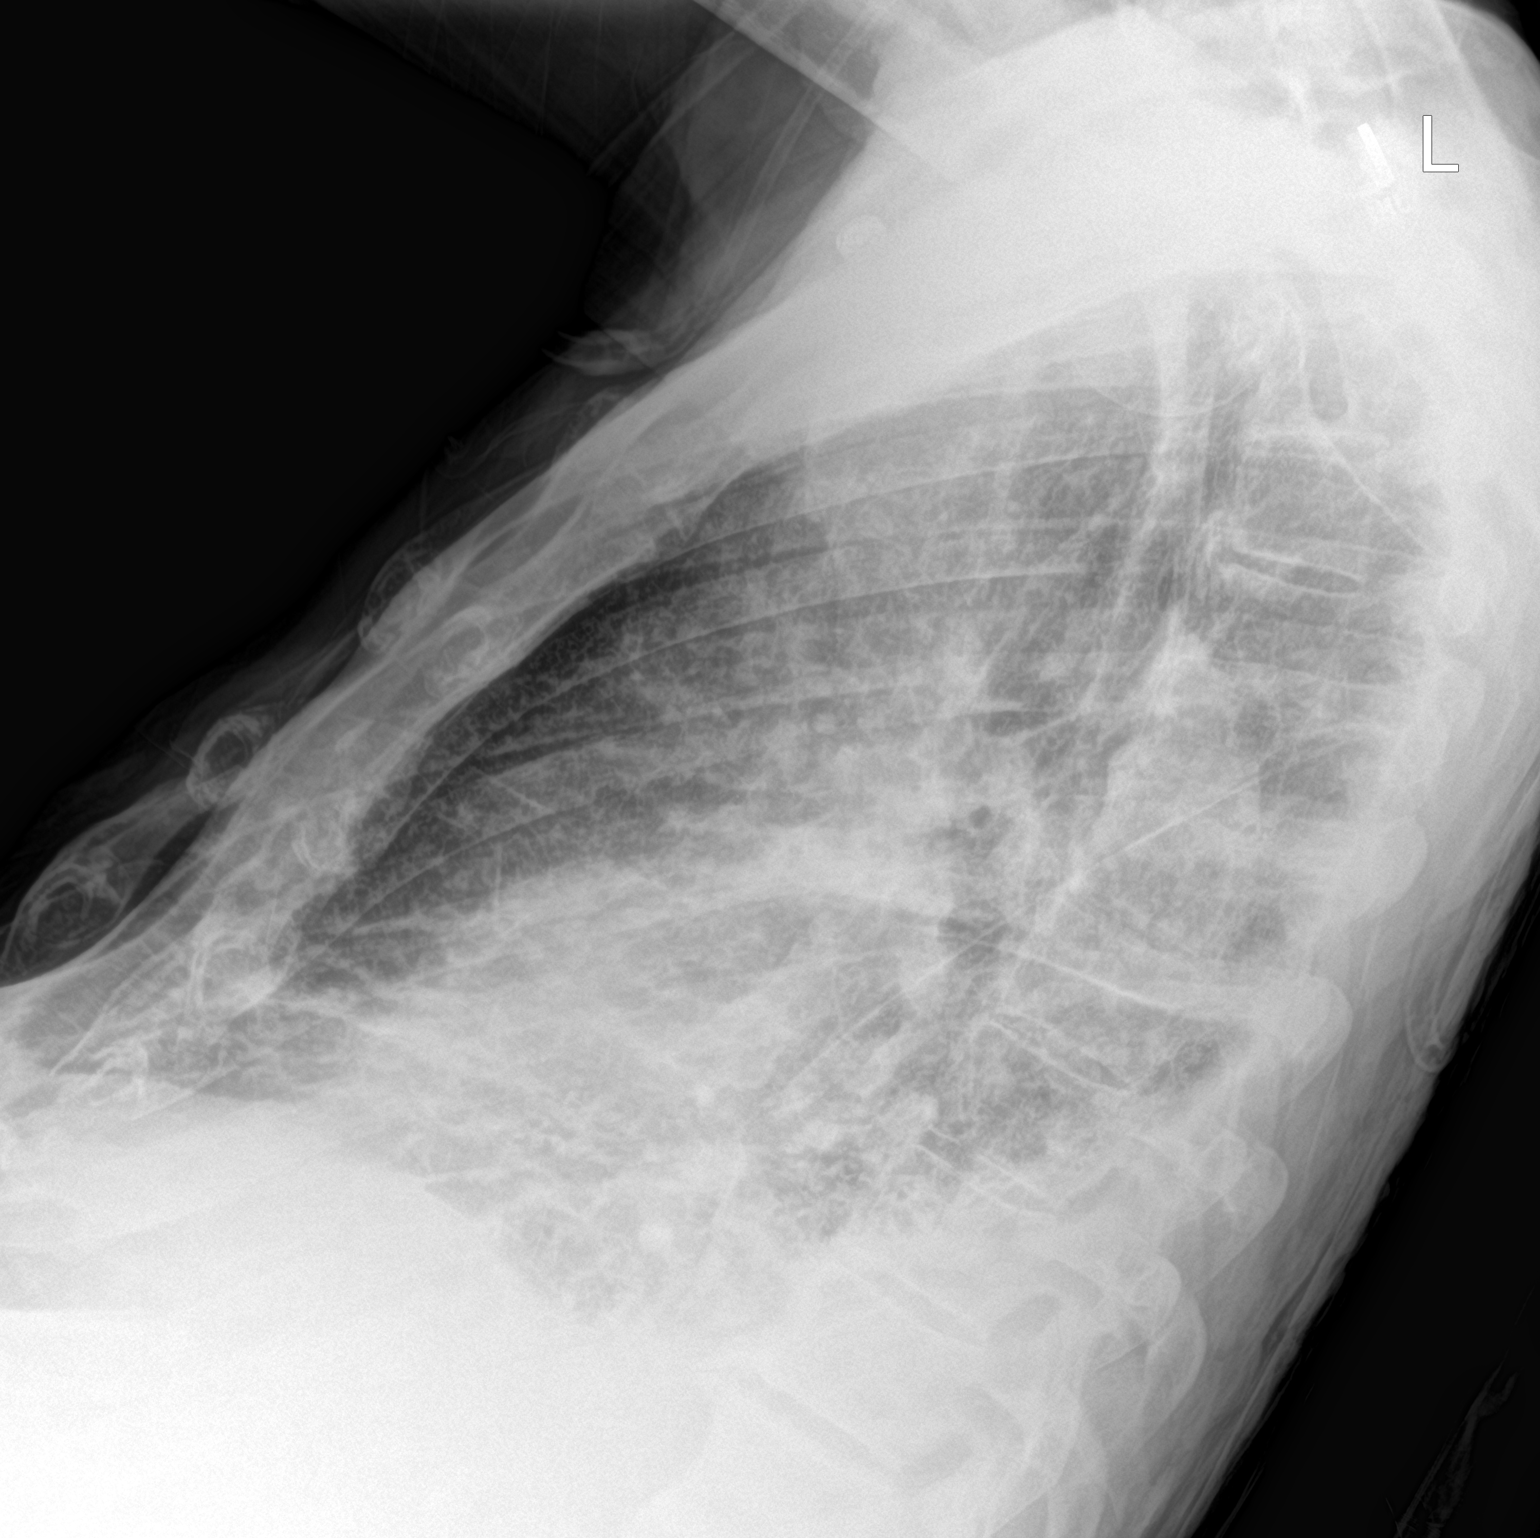

[chest ap]
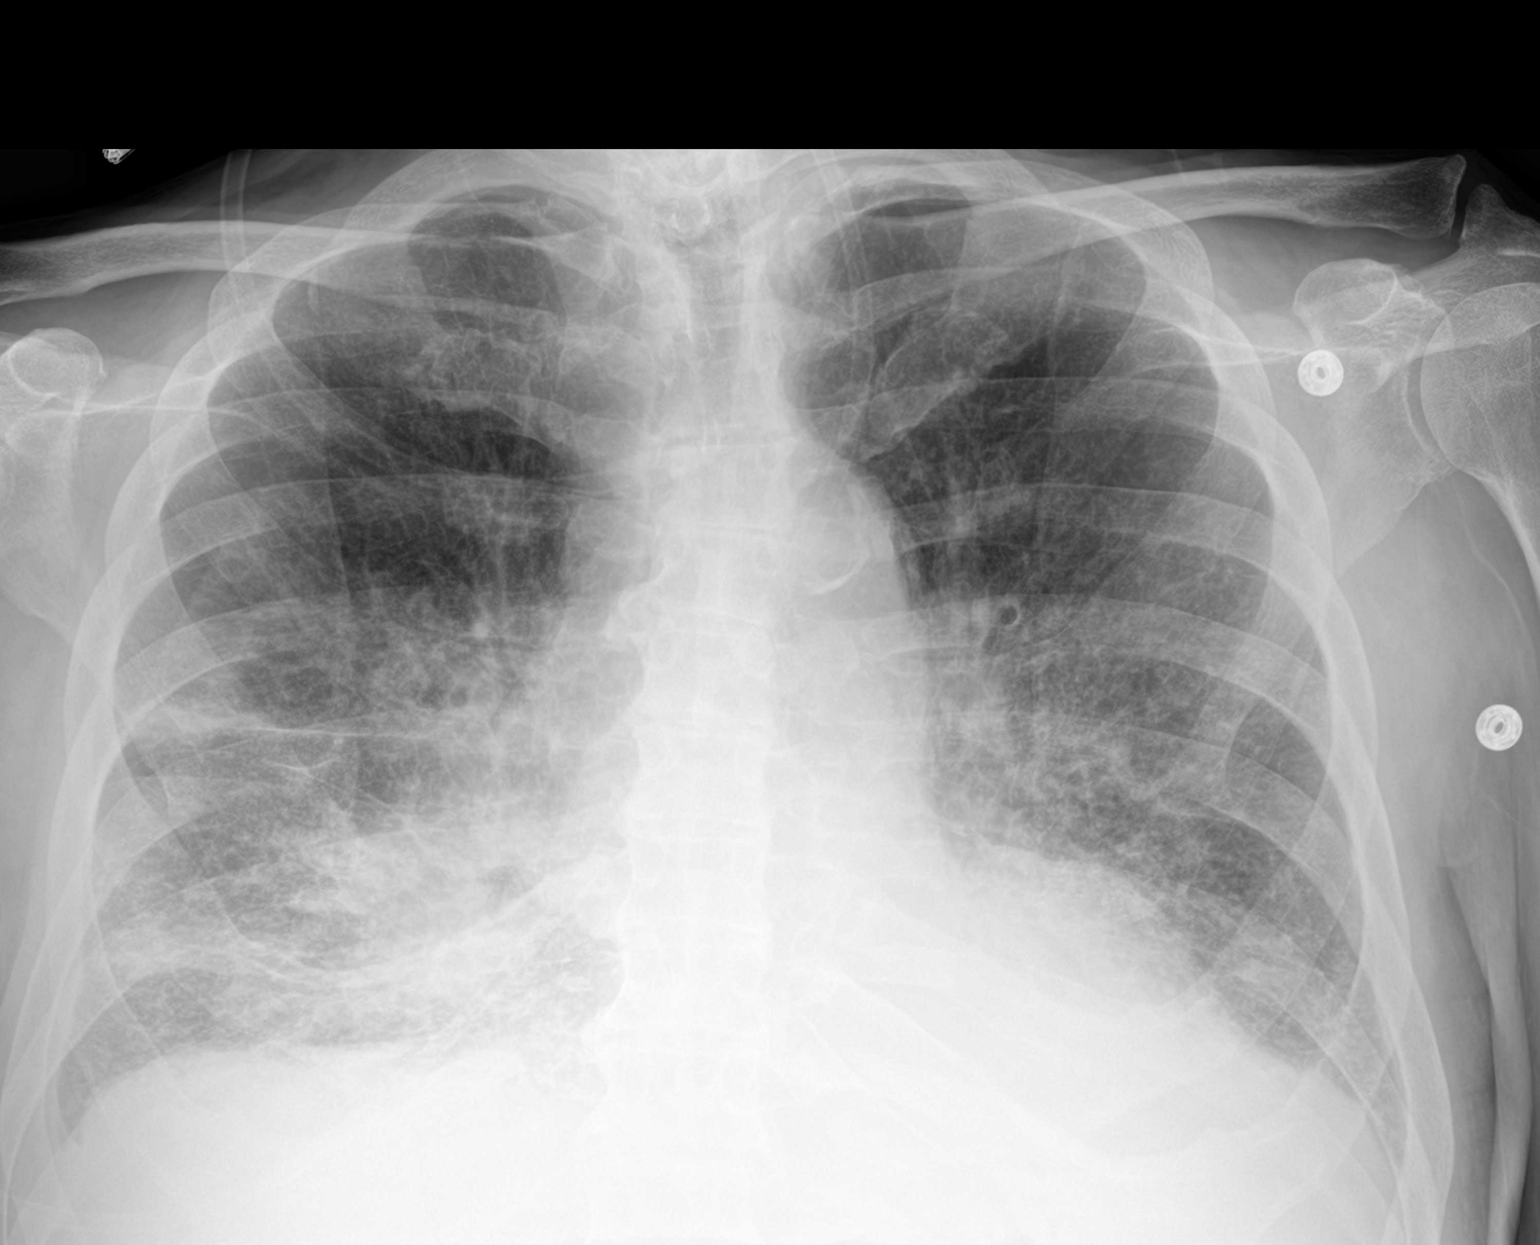

[2 of 2 positions shown; findings below may reference images not displayed]

FINDINGS: Lungs are somewhat hypoinflated demonstrate persistent bilateral
central perihilar opacification slightly worse. Small layering
bilateral pleural effusions. Cardiomediastinal silhouette and
remainder the exam is unchanged.
IMPRESSION: Mild interval worsening bilateral hazy central perihilar
opacification which may be due to worsening interstitial edema as
infection is also possible. Known bilateral hilar adenopathy. Small
pleural effusions unchanged.

## 2018-07-11 IMAGING — DX DG ABD PORTABLE 1V
1 series · 1 of 1 positions shown · non-contrast
Comparison: Fluoroscopy dated 03/06/2017

CLINICAL DATA: 85-year-old male with NG tube placement

EXAM:
PORTABLE ABDOMEN - 1 VIEW

[abdomen kub]
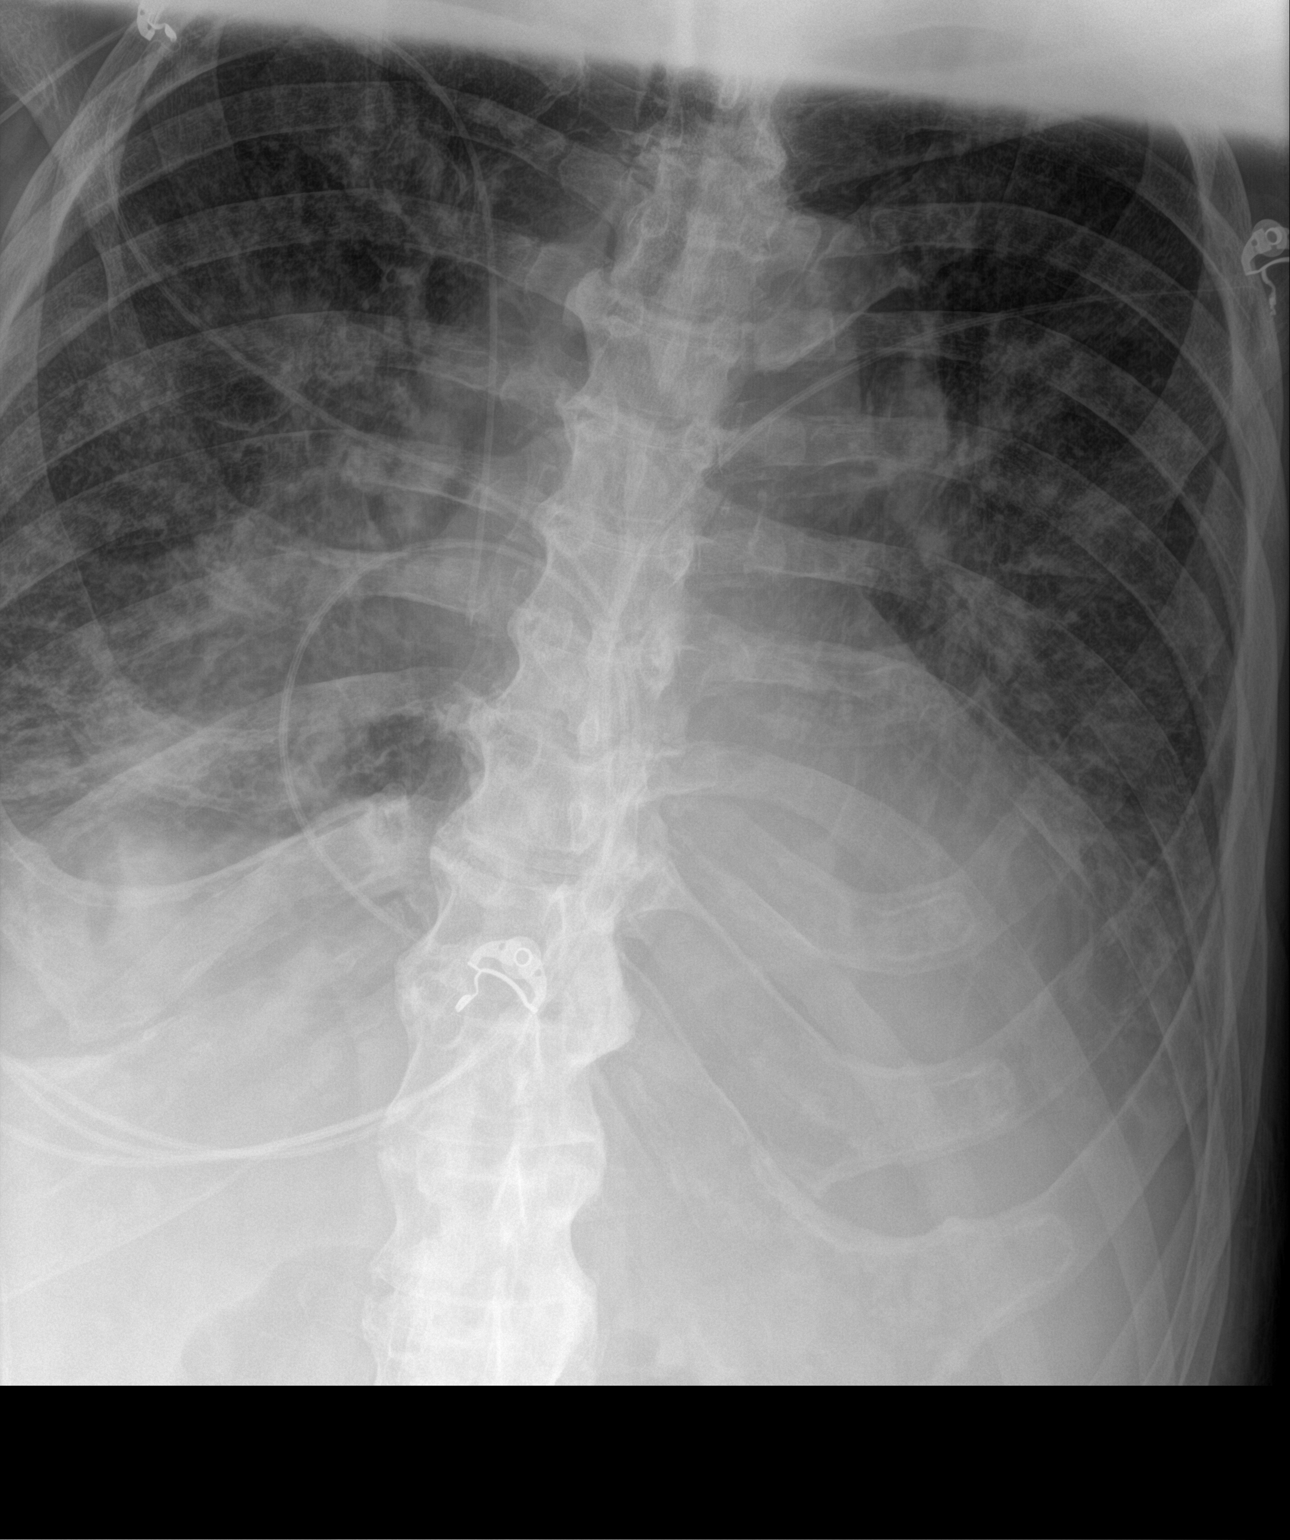

[1 of 1 positions shown; findings below may reference images not displayed]

FINDINGS: An enteric tube is not visualized on the provided image. Right-sided
PICC with tip in the region of the cavoatrial junction noted. There
are bilateral pleural effusions and bilateral lower lung field
atelectasis versus infiltrate. Diffuse interstitial and nodular
densities noted in the visualized lungs. The cardiac borders are
silhouetted. There is degenerative changes of the spine.
IMPRESSION: 1. Nonvisualization of the enteric tube.
2. Right-sided PICC with tip in the region of the cavoatrial
junction.
3. Bilateral pleural effusions and bilateral mid to lower lung field
atelectasis versus infiltrate. Clinical correlation is recommended.

## 2018-07-13 IMAGING — RF DG NASO G TUBE PLC W/FL W/RAD
1 series · 1 of 1 positions shown · IV contrast (agent unspecified)
Comparison: None.

CLINICAL DATA: Malpositioned nasogastric tube.

EXAM:
NASO G TUBE PLACEMENT WITH FL AND WITH RAD
CONTRAST:  30 cc Esovue-VYY - administered into the gastric lumen
FLUOROSCOPY TIME:  2 minutes, 36 seconds (13.1 mGy)

[Series 2: cp_standard · 0.25mm/px · 1 of 1 slices shown]
[im 1/1]
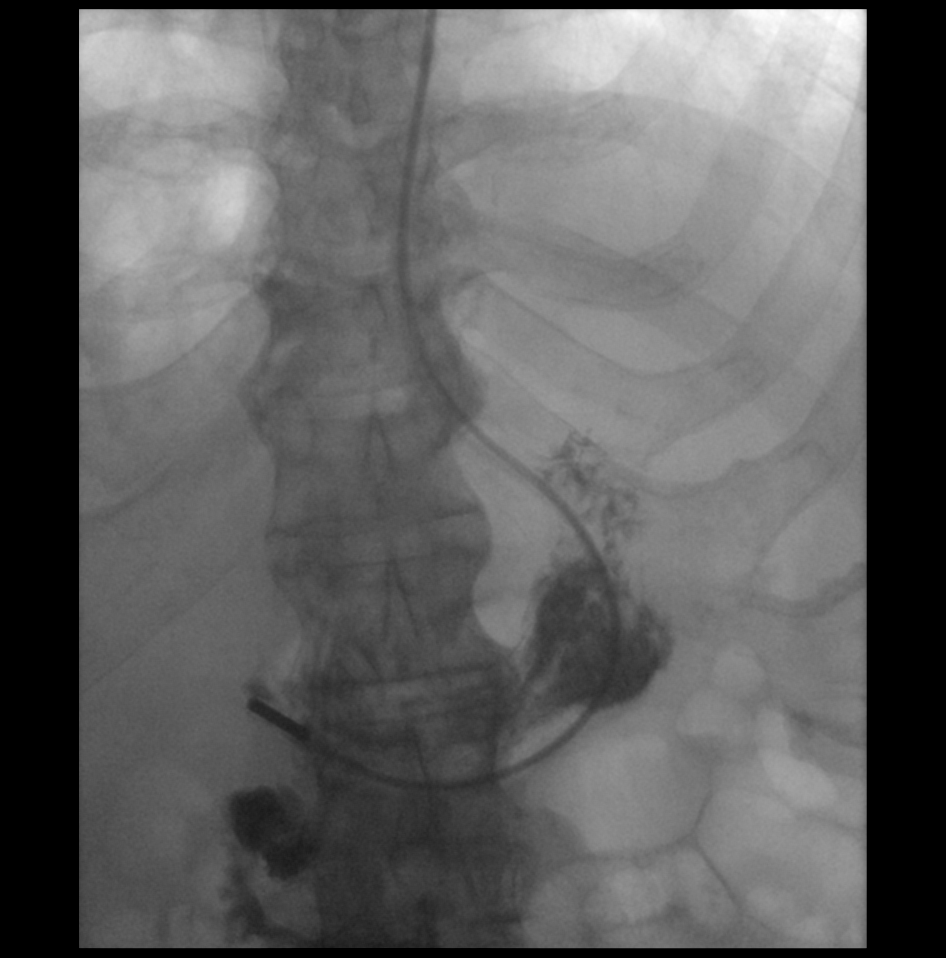

[1 of 1 positions shown; findings below may reference images not displayed]

FINDINGS: The patient did not wish to have the existing nasogastric tube
removed and requested the tube simply be advanced.

As such, existing retracted nasogastric tube was advanced utilizing
intermittent fluoroscopic guidance with tip ultimately terminating
within the gastric antrum. Contrast injection confirmed appropriate
positioning and functionality.
IMPRESSION: Fluoroscopic guided advancement of existing nasogastric tube with
tip terminating within the gastric antrum.

The nasogastric tube is ready for immediate use.
